# Patient Record
Sex: Female | Born: 1940
Health system: Southern US, Community
[De-identification: ages and names within clinical notes are randomized; demographics above are authoritative.]

## PROBLEM LIST (undated history)

## (undated) DIAGNOSIS — M47812 Spondylosis without myelopathy or radiculopathy, cervical region: Secondary | ICD-10-CM

## (undated) DIAGNOSIS — C801 Malignant (primary) neoplasm, unspecified: Secondary | ICD-10-CM

## (undated) DIAGNOSIS — M1990 Unspecified osteoarthritis, unspecified site: Secondary | ICD-10-CM

## (undated) DIAGNOSIS — L900 Lichen sclerosus et atrophicus: Secondary | ICD-10-CM

## (undated) DIAGNOSIS — Z9889 Other specified postprocedural states: Secondary | ICD-10-CM

## (undated) DIAGNOSIS — E039 Hypothyroidism, unspecified: Secondary | ICD-10-CM

## (undated) DIAGNOSIS — G454 Transient global amnesia: Secondary | ICD-10-CM

## (undated) DIAGNOSIS — R112 Nausea with vomiting, unspecified: Secondary | ICD-10-CM

## (undated) DIAGNOSIS — F419 Anxiety disorder, unspecified: Secondary | ICD-10-CM

## (undated) DIAGNOSIS — T4145XA Adverse effect of unspecified anesthetic, initial encounter: Secondary | ICD-10-CM

## (undated) DIAGNOSIS — I10 Essential (primary) hypertension: Secondary | ICD-10-CM

## (undated) DIAGNOSIS — K219 Gastro-esophageal reflux disease without esophagitis: Secondary | ICD-10-CM

## (undated) DIAGNOSIS — L659 Nonscarring hair loss, unspecified: Secondary | ICD-10-CM

## (undated) DIAGNOSIS — D1803 Hemangioma of intra-abdominal structures: Secondary | ICD-10-CM

## (undated) DIAGNOSIS — R519 Headache, unspecified: Secondary | ICD-10-CM

## (undated) DIAGNOSIS — I7300 Raynaud's syndrome without gangrene: Secondary | ICD-10-CM

## (undated) DIAGNOSIS — E069 Thyroiditis, unspecified: Secondary | ICD-10-CM

## (undated) DIAGNOSIS — T8859XA Other complications of anesthesia, initial encounter: Secondary | ICD-10-CM

## (undated) DIAGNOSIS — B009 Herpesviral infection, unspecified: Secondary | ICD-10-CM

## (undated) DIAGNOSIS — E785 Hyperlipidemia, unspecified: Secondary | ICD-10-CM

## (undated) DIAGNOSIS — I447 Left bundle-branch block, unspecified: Secondary | ICD-10-CM

## (undated) HISTORY — DX: Essential (primary) hypertension: I10

## (undated) HISTORY — PX: CATARACT EXTRACTION: SUR2

## (undated) HISTORY — PX: CHOLECYSTECTOMY: SHX55

## (undated) HISTORY — DX: Hemangioma of intra-abdominal structures: D18.03

## (undated) HISTORY — DX: Spondylosis without myelopathy or radiculopathy, cervical region: M47.812

## (undated) HISTORY — PX: ROTATOR CUFF REPAIR: SHX139

## (undated) HISTORY — DX: Thyroiditis, unspecified: E06.9

## (undated) HISTORY — DX: Transient global amnesia: G45.4

## (undated) HISTORY — PX: SHOULDER SURGERY: SHX246

## (undated) HISTORY — DX: Hyperlipidemia, unspecified: E78.5

## (undated) HISTORY — DX: Unspecified osteoarthritis, unspecified site: M19.90

## (undated) HISTORY — DX: Lichen sclerosus et atrophicus: L90.0

## (undated) HISTORY — DX: Herpesviral infection, unspecified: B00.9

## (undated) HISTORY — DX: Nonscarring hair loss, unspecified: L65.9

---
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
## 1989-01-26 HISTORY — PX: TOTAL ABDOMINAL HYSTERECTOMY W/ BILATERAL SALPINGOOPHORECTOMY: SHX83

## 1999-01-25 ENCOUNTER — Ambulatory Visit (HOSPITAL_COMMUNITY): Admission: RE | Admit: 1999-01-25 | Discharge: 1999-01-25 | Payer: Self-pay | Admitting: Obstetrics & Gynecology

## 1999-06-14 ENCOUNTER — Encounter: Admission: RE | Admit: 1999-06-14 | Discharge: 1999-06-14 | Payer: Self-pay | Admitting: *Deleted

## 1999-06-14 ENCOUNTER — Encounter: Payer: Self-pay | Admitting: *Deleted

## 1999-09-07 ENCOUNTER — Other Ambulatory Visit: Admission: RE | Admit: 1999-09-07 | Discharge: 1999-09-07 | Payer: Self-pay | Admitting: *Deleted

## 2000-02-14 ENCOUNTER — Encounter: Admission: RE | Admit: 2000-02-14 | Discharge: 2000-02-14 | Payer: Self-pay | Admitting: Orthopedic Surgery

## 2000-02-14 ENCOUNTER — Encounter: Payer: Self-pay | Admitting: Orthopedic Surgery

## 2000-10-29 ENCOUNTER — Encounter: Payer: Self-pay | Admitting: Orthopedic Surgery

## 2000-10-29 ENCOUNTER — Encounter: Admission: RE | Admit: 2000-10-29 | Discharge: 2000-10-29 | Payer: Self-pay | Admitting: Orthopedic Surgery

## 2000-11-26 DIAGNOSIS — M47812 Spondylosis without myelopathy or radiculopathy, cervical region: Secondary | ICD-10-CM

## 2000-11-26 HISTORY — DX: Spondylosis without myelopathy or radiculopathy, cervical region: M47.812

## 2002-02-11 ENCOUNTER — Ambulatory Visit (HOSPITAL_COMMUNITY): Admission: RE | Admit: 2002-02-11 | Discharge: 2002-02-11 | Payer: Self-pay | Admitting: Gastroenterology

## 2002-02-11 ENCOUNTER — Encounter (INDEPENDENT_AMBULATORY_CARE_PROVIDER_SITE_OTHER): Payer: Self-pay | Admitting: Specialist

## 2002-09-10 ENCOUNTER — Other Ambulatory Visit: Admission: RE | Admit: 2002-09-10 | Discharge: 2002-09-10 | Payer: Self-pay | Admitting: *Deleted

## 2002-11-23 ENCOUNTER — Encounter: Payer: Self-pay | Admitting: Orthopedic Surgery

## 2002-11-23 ENCOUNTER — Encounter: Admission: RE | Admit: 2002-11-23 | Discharge: 2002-11-23 | Payer: Self-pay | Admitting: Orthopedic Surgery

## 2003-09-28 ENCOUNTER — Encounter: Admission: RE | Admit: 2003-09-28 | Discharge: 2003-09-28 | Payer: Self-pay | Admitting: Orthopedic Surgery

## 2006-10-29 HISTORY — PX: CARDIOVASCULAR STRESS TEST: SHX262

## 2008-03-25 ENCOUNTER — Encounter: Admission: RE | Admit: 2008-03-25 | Discharge: 2008-03-25 | Payer: Self-pay | Admitting: Sports Medicine

## 2008-03-31 ENCOUNTER — Encounter: Admission: RE | Admit: 2008-03-31 | Discharge: 2008-03-31 | Payer: Self-pay | Admitting: Sports Medicine

## 2008-10-03 ENCOUNTER — Encounter: Admission: RE | Admit: 2008-10-03 | Discharge: 2008-10-03 | Payer: Self-pay | Admitting: Gastroenterology

## 2008-12-06 ENCOUNTER — Emergency Department (HOSPITAL_COMMUNITY): Admission: EM | Admit: 2008-12-06 | Discharge: 2008-12-06 | Payer: Self-pay | Admitting: Emergency Medicine

## 2009-07-29 DIAGNOSIS — D1803 Hemangioma of intra-abdominal structures: Secondary | ICD-10-CM

## 2009-07-29 HISTORY — DX: Hemangioma of intra-abdominal structures: D18.03

## 2009-10-11 HISTORY — PX: US ECHOCARDIOGRAPHY: HXRAD669

## 2009-10-13 ENCOUNTER — Ambulatory Visit (HOSPITAL_COMMUNITY): Admission: RE | Admit: 2009-10-13 | Discharge: 2009-10-13 | Payer: Self-pay | Admitting: Internal Medicine

## 2009-11-28 ENCOUNTER — Encounter: Admission: RE | Admit: 2009-11-28 | Discharge: 2009-11-28 | Payer: Self-pay | Admitting: Internal Medicine

## 2010-04-27 ENCOUNTER — Ambulatory Visit (HOSPITAL_COMMUNITY): Admission: RE | Admit: 2010-04-27 | Discharge: 2010-04-27 | Payer: Self-pay | Admitting: Internal Medicine

## 2010-07-11 ENCOUNTER — Encounter
Admission: RE | Admit: 2010-07-11 | Discharge: 2010-07-11 | Payer: Self-pay | Source: Home / Self Care | Attending: Internal Medicine | Admitting: Internal Medicine

## 2010-08-16 ENCOUNTER — Emergency Department (HOSPITAL_COMMUNITY)
Admission: EM | Admit: 2010-08-16 | Discharge: 2010-08-16 | Payer: Self-pay | Source: Home / Self Care | Admitting: Emergency Medicine

## 2010-08-19 ENCOUNTER — Encounter: Payer: Self-pay | Admitting: Rheumatology

## 2010-08-20 LAB — POCT I-STAT, CHEM 8
BUN: 18 mg/dL (ref 6–23)
Calcium, Ion: 1.25 mmol/L (ref 1.12–1.32)
Chloride: 97 mEq/L (ref 96–112)
Creatinine, Ser: 0.9 mg/dL (ref 0.4–1.2)
Glucose, Bld: 130 mg/dL — ABNORMAL HIGH (ref 70–99)
HCT: 42 % (ref 36.0–46.0)
Hemoglobin: 14.3 g/dL (ref 12.0–15.0)
Potassium: 3.9 mEq/L (ref 3.5–5.1)
Sodium: 133 mEq/L — ABNORMAL LOW (ref 135–145)
TCO2: 29 mmol/L (ref 0–100)

## 2010-10-02 ENCOUNTER — Other Ambulatory Visit: Payer: Self-pay | Admitting: Internal Medicine

## 2010-10-02 DIAGNOSIS — R911 Solitary pulmonary nodule: Secondary | ICD-10-CM

## 2010-10-11 ENCOUNTER — Ambulatory Visit
Admission: RE | Admit: 2010-10-11 | Discharge: 2010-10-11 | Disposition: A | Discharge status: Home / Self Care | Payer: Medicare Other | Source: Ambulatory Visit | Attending: Internal Medicine | Admitting: Internal Medicine

## 2010-10-11 DIAGNOSIS — R911 Solitary pulmonary nodule: Secondary | ICD-10-CM

## 2010-10-22 LAB — BASIC METABOLIC PANEL
BUN: 13 mg/dL (ref 6–23)
CO2: 27 mEq/L (ref 19–32)
Calcium: 9.7 mg/dL (ref 8.4–10.5)
Chloride: 99 mEq/L (ref 96–112)
Creatinine, Ser: 0.65 mg/dL (ref 0.4–1.2)
GFR calc Af Amer: >60 mL/min (ref >60)
GFR calc non Af Amer: >60 mL/min (ref >60)
Glucose, Bld: 102 mg/dL — ABNORMAL HIGH (ref 70–99)
Potassium: 4 mEq/L (ref 3.5–5.1)
Sodium: 133 mEq/L — ABNORMAL LOW (ref 135–145)

## 2010-11-06 LAB — POCT I-STAT, CHEM 8
BUN: 12 mg/dL (ref 6–23)
Calcium, Ion: 1.09 mmol/L — ABNORMAL LOW (ref 1.12–1.32)
Chloride: 104 mEq/L (ref 96–112)
Creatinine, Ser: 0.9 mg/dL (ref 0.4–1.2)
Glucose, Bld: 100 mg/dL — ABNORMAL HIGH (ref 70–99)
HCT: 43 % (ref 36.0–46.0)
Hemoglobin: 14.6 g/dL (ref 12.0–15.0)
Potassium: 3.2 mEq/L — ABNORMAL LOW (ref 3.5–5.1)
Sodium: 138 mEq/L (ref 135–145)
TCO2: 23 mmol/L (ref 0–100)

## 2011-05-15 ENCOUNTER — Encounter: Payer: Self-pay | Admitting: Cardiovascular Disease

## 2011-05-16 ENCOUNTER — Ambulatory Visit: Payer: Medicare Other | Admitting: Cardiovascular Disease

## 2011-05-20 ENCOUNTER — Ambulatory Visit (INDEPENDENT_AMBULATORY_CARE_PROVIDER_SITE_OTHER): Payer: Medicare Other | Admitting: Cardiovascular Disease

## 2011-05-20 ENCOUNTER — Encounter: Payer: Self-pay | Admitting: Cardiovascular Disease

## 2011-05-20 DIAGNOSIS — I10 Essential (primary) hypertension: Secondary | ICD-10-CM | POA: Insufficient documentation

## 2011-05-20 DIAGNOSIS — R079 Chest pain, unspecified: Secondary | ICD-10-CM | POA: Insufficient documentation

## 2011-05-20 NOTE — Patient Instructions (Addendum)
Your physician recommends that you schedule a follow-up appointment in: as needed basis  

## 2011-05-20 NOTE — Assessment & Plan Note (Signed)
She's not had any recurrent episodes of chest pain. She had a negative stress test in 2011. We will have her see [place] on an as-needed basis. She'll continue to followup with Dr. Clelia Croft for her blood pressure management. We'll be happy see her in the future if needed.

## 2011-05-20 NOTE — Progress Notes (Signed)
Sabrina Parks Date of Birth  03/01/1941 [facility] [address]    [address] [place], [place]  [address] [phone]  Fax  [phone]  History of Present Illness:  70 year old female with a history of hypertension, hyperlipidemia and rotator cuff repair. She's done quite well since I last saw her a year and half ago.  He's not had any cardiac complaints such as chest pain or shortness breath.  She seen Dr. Reyes Ivan  in the past. She had a negative stress test in 2008. She was sent back to [place] in 2011 for recurrent episodes of chest pain. A stress test at that time was negative for ischemia. She's done very well since that time.  Current Outpatient Prescriptions on File Prior to Visit  Medication Sig Dispense Refill  . amitriptyline (ELAVIL) 25 MG tablet Take 25 mg by mouth at bedtime.        Marland Kitchen aspirin 81 MG tablet Take 81 mg by mouth daily.        Marland Kitchen BIOTIN PO Take by mouth daily.        . fish oil-omega-3 fatty acids 1000 MG capsule Take 1 g by mouth daily.        . hydrochlorothiazide (HYDRODIURIL) 25 MG tablet Take 12.5 mg by mouth daily.       . Multiple Vitamin (MULTIVITAMIN) tablet Take 1 tablet by mouth daily.        . rosuvastatin (CRESTOR) 20 MG tablet Take 20 mg by mouth daily.          Allergies  Allergen Reactions  . Morphine And Related     Past Medical History  Diagnosis Date  . Chest pain   . Hyperlipidemia   . Hypertension   . Arthritis     Past Surgical History  Procedure Date  . Rotator cuff repair   . Appendectomy   . Total vaginal hysterectomy   . US echocardiography 10/11/2009    EF 55-60%  . Cardiovascular stress test 10/29/2006    EF 78%.     History  Smoking status  . Former Smoker  Smokeless tobacco  . Not on file    History  Alcohol Use No    Family History  Problem Relation Age of Onset  . Stroke Mother   . Cancer Father     Reviw of Systems:  Reviewed in the HPI.  All other systems are negative.  Physical Exam: BP  125/71  Pulse 87  Ht 5\' 4"  (1.626 m)  Wt 144 lb 12.8 oz (65.681 kg)  BMI 24.85 kg/m2 The patient is alert and oriented x 3.  The mood and affect are normal.   Skin: warm and dry.  Color is normal.    HEENT:   Pupils carotids. Her neck is supple  Lungs: Lungs are clear   Heart: Regular rate, S1-S2.    Abdomen: Benign  Extremities:  No clubbing cyanosis or  Neuro:  Nonfocal     ECG: Normal sinus rhythm. She has occasional premature ventricular contractions.  Assessment / Plan:

## 2011-05-29 ENCOUNTER — Other Ambulatory Visit: Payer: Self-pay | Admitting: *Deleted

## 2011-05-29 MED ORDER — SPIRONOLACTONE 25 MG PO TABS: 25.0000 mg | ORAL_TABLET | Freq: Every day | ORAL | Status: DC

## 2011-05-29 MED ORDER — OMEPRAZOLE 20 MG PO CPDR: 20.0000 mg | DELAYED_RELEASE_CAPSULE | Freq: Every day | ORAL | Status: DC

## 2011-05-29 MED ORDER — BIOTIN 1000 MCG PO TABS: 1000.0000 ug | ORAL_TABLET | Freq: Every day | ORAL | Status: AC

## 2011-05-29 NOTE — Progress Notes (Signed)
Pt mailed in written changes to [facility], all were corrected

## 2011-08-01 DIAGNOSIS — L821 Other seborrheic keratosis: Secondary | ICD-10-CM | POA: Diagnosis not present

## 2011-08-01 DIAGNOSIS — L659 Nonscarring hair loss, unspecified: Secondary | ICD-10-CM | POA: Diagnosis not present

## 2011-08-06 DIAGNOSIS — M25569 Pain in unspecified knee: Secondary | ICD-10-CM | POA: Diagnosis not present

## 2011-08-13 DIAGNOSIS — M25569 Pain in unspecified knee: Secondary | ICD-10-CM | POA: Diagnosis not present

## 2011-08-23 DIAGNOSIS — K219 Gastro-esophageal reflux disease without esophagitis: Secondary | ICD-10-CM | POA: Diagnosis not present

## 2011-10-07 ENCOUNTER — Other Ambulatory Visit: Payer: Self-pay | Admitting: Internal Medicine

## 2011-10-07 DIAGNOSIS — R911 Solitary pulmonary nodule: Secondary | ICD-10-CM

## 2011-10-09 ENCOUNTER — Other Ambulatory Visit: Payer: Medicare Other

## 2011-10-09 DIAGNOSIS — M1710 Unilateral primary osteoarthritis, unspecified knee: Secondary | ICD-10-CM | POA: Diagnosis not present

## 2011-10-10 DIAGNOSIS — M25569 Pain in unspecified knee: Secondary | ICD-10-CM | POA: Diagnosis not present

## 2011-10-14 ENCOUNTER — Ambulatory Visit
Admission: RE | Admit: 2011-10-14 | Discharge: 2011-10-14 | Disposition: A | Discharge status: Home / Self Care | Payer: Medicare Other | Source: Ambulatory Visit | Attending: Internal Medicine | Admitting: Internal Medicine

## 2011-10-14 DIAGNOSIS — R911 Solitary pulmonary nodule: Secondary | ICD-10-CM

## 2011-10-14 DIAGNOSIS — R918 Other nonspecific abnormal finding of lung field: Secondary | ICD-10-CM | POA: Diagnosis not present

## 2011-10-17 ENCOUNTER — Other Ambulatory Visit: Payer: Self-pay | Admitting: Dermatology

## 2011-10-17 DIAGNOSIS — L819 Disorder of pigmentation, unspecified: Secondary | ICD-10-CM | POA: Diagnosis not present

## 2011-10-17 DIAGNOSIS — L578 Other skin changes due to chronic exposure to nonionizing radiation: Secondary | ICD-10-CM | POA: Diagnosis not present

## 2011-10-17 DIAGNOSIS — D239 Other benign neoplasm of skin, unspecified: Secondary | ICD-10-CM | POA: Diagnosis not present

## 2011-10-17 DIAGNOSIS — L570 Actinic keratosis: Secondary | ICD-10-CM | POA: Diagnosis not present

## 2011-10-17 DIAGNOSIS — L821 Other seborrheic keratosis: Secondary | ICD-10-CM | POA: Diagnosis not present

## 2011-11-05 DIAGNOSIS — Z1231 Encounter for screening mammogram for malignant neoplasm of breast: Secondary | ICD-10-CM | POA: Diagnosis not present

## 2011-11-25 DIAGNOSIS — M9420 Chondromalacia, unspecified site: Secondary | ICD-10-CM | POA: Diagnosis not present

## 2011-11-25 DIAGNOSIS — Y929 Unspecified place or not applicable: Secondary | ICD-10-CM | POA: Diagnosis not present

## 2011-11-25 DIAGNOSIS — X58XXXA Exposure to other specified factors, initial encounter: Secondary | ICD-10-CM | POA: Diagnosis not present

## 2011-11-25 DIAGNOSIS — M2240 Chondromalacia patellae, unspecified knee: Secondary | ICD-10-CM | POA: Diagnosis not present

## 2011-11-25 DIAGNOSIS — IMO0002 Reserved for concepts with insufficient information to code with codable children: Secondary | ICD-10-CM | POA: Diagnosis not present

## 2011-12-02 DIAGNOSIS — M25569 Pain in unspecified knee: Secondary | ICD-10-CM | POA: Diagnosis not present

## 2012-04-01 DIAGNOSIS — E785 Hyperlipidemia, unspecified: Secondary | ICD-10-CM | POA: Diagnosis not present

## 2012-04-01 DIAGNOSIS — I8000 Phlebitis and thrombophlebitis of superficial vessels of unspecified lower extremity: Secondary | ICD-10-CM | POA: Diagnosis not present

## 2012-04-01 DIAGNOSIS — IMO0001 Reserved for inherently not codable concepts without codable children: Secondary | ICD-10-CM | POA: Diagnosis not present

## 2012-04-01 DIAGNOSIS — R7301 Impaired fasting glucose: Secondary | ICD-10-CM | POA: Diagnosis not present

## 2012-04-01 DIAGNOSIS — Z23 Encounter for immunization: Secondary | ICD-10-CM | POA: Diagnosis not present

## 2012-04-20 DIAGNOSIS — D239 Other benign neoplasm of skin, unspecified: Secondary | ICD-10-CM | POA: Diagnosis not present

## 2012-04-20 DIAGNOSIS — Z85828 Personal history of other malignant neoplasm of skin: Secondary | ICD-10-CM | POA: Diagnosis not present

## 2012-04-20 DIAGNOSIS — L570 Actinic keratosis: Secondary | ICD-10-CM | POA: Diagnosis not present

## 2012-04-20 DIAGNOSIS — L821 Other seborrheic keratosis: Secondary | ICD-10-CM | POA: Diagnosis not present

## 2012-04-20 DIAGNOSIS — L819 Disorder of pigmentation, unspecified: Secondary | ICD-10-CM | POA: Diagnosis not present

## 2012-05-11 DIAGNOSIS — M79609 Pain in unspecified limb: Secondary | ICD-10-CM | POA: Diagnosis not present

## 2012-06-04 ENCOUNTER — Other Ambulatory Visit: Payer: Self-pay | Admitting: Dermatology

## 2012-06-04 DIAGNOSIS — C4442 Squamous cell carcinoma of skin of scalp and neck: Secondary | ICD-10-CM | POA: Diagnosis not present

## 2012-06-09 DIAGNOSIS — I10 Essential (primary) hypertension: Secondary | ICD-10-CM | POA: Diagnosis not present

## 2012-06-09 DIAGNOSIS — E039 Hypothyroidism, unspecified: Secondary | ICD-10-CM | POA: Diagnosis not present

## 2012-06-09 DIAGNOSIS — IMO0001 Reserved for inherently not codable concepts without codable children: Secondary | ICD-10-CM | POA: Diagnosis not present

## 2012-06-09 DIAGNOSIS — E785 Hyperlipidemia, unspecified: Secondary | ICD-10-CM | POA: Diagnosis not present

## 2012-06-12 ENCOUNTER — Encounter (HOSPITAL_COMMUNITY): Payer: Self-pay

## 2012-06-12 ENCOUNTER — Emergency Department (HOSPITAL_COMMUNITY): Payer: Medicare Other

## 2012-06-12 ENCOUNTER — Observation Stay (HOSPITAL_COMMUNITY)
Admission: EM | Admit: 2012-06-12 | Discharge: 2012-06-13 | Disposition: A | Discharge status: Home / Self Care | Payer: Medicare Other | Attending: Internal Medicine | Admitting: Internal Medicine

## 2012-06-12 DIAGNOSIS — Z79899 Other long term (current) drug therapy: Secondary | ICD-10-CM | POA: Diagnosis not present

## 2012-06-12 DIAGNOSIS — Z7982 Long term (current) use of aspirin: Secondary | ICD-10-CM | POA: Insufficient documentation

## 2012-06-12 DIAGNOSIS — Z7902 Long term (current) use of antithrombotics/antiplatelets: Secondary | ICD-10-CM | POA: Diagnosis not present

## 2012-06-12 DIAGNOSIS — G9389 Other specified disorders of brain: Secondary | ICD-10-CM | POA: Diagnosis not present

## 2012-06-12 DIAGNOSIS — E785 Hyperlipidemia, unspecified: Secondary | ICD-10-CM | POA: Diagnosis not present

## 2012-06-12 DIAGNOSIS — G459 Transient cerebral ischemic attack, unspecified: Secondary | ICD-10-CM | POA: Diagnosis not present

## 2012-06-12 DIAGNOSIS — Z87891 Personal history of nicotine dependence: Secondary | ICD-10-CM | POA: Insufficient documentation

## 2012-06-12 DIAGNOSIS — M129 Arthropathy, unspecified: Secondary | ICD-10-CM | POA: Diagnosis not present

## 2012-06-12 DIAGNOSIS — L570 Actinic keratosis: Secondary | ICD-10-CM | POA: Diagnosis not present

## 2012-06-12 DIAGNOSIS — I10 Essential (primary) hypertension: Secondary | ICD-10-CM | POA: Insufficient documentation

## 2012-06-12 DIAGNOSIS — I6350 Cerebral infarction due to unspecified occlusion or stenosis of unspecified cerebral artery: Secondary | ICD-10-CM | POA: Diagnosis not present

## 2012-06-12 LAB — URINALYSIS, ROUTINE W REFLEX MICROSCOPIC
Bilirubin Urine: NEGATIVE
Glucose, UA: NEGATIVE mg/dL
Hgb urine dipstick: NEGATIVE
Ketones, ur: NEGATIVE mg/dL
Leukocytes, UA: NEGATIVE
Nitrite: NEGATIVE
Protein, ur: NEGATIVE mg/dL
Specific Gravity, Urine: 1.007 (ref 1.005–1.030)
Urobilinogen, UA: 0.2 mg/dL (ref 0.0–1.0)
pH: 7 (ref 5.0–8.0)

## 2012-06-12 LAB — CBC
HCT: 38.1 % (ref 36.0–46.0)
Hemoglobin: 13.4 g/dL (ref 12.0–15.0)
MCH: 29.5 pg (ref 26.0–34.0)
MCHC: 35.2 g/dL (ref 30.0–36.0)
MCV: 83.9 fL (ref 78.0–100.0)
Platelets: 332 10*3/uL (ref 150–400)
RBC: 4.54 MIL/uL (ref 3.87–5.11)
RDW: 12.9 % (ref 11.5–15.5)
WBC: 7.8 10*3/uL (ref 4.0–10.5)

## 2012-06-12 LAB — COMPREHENSIVE METABOLIC PANEL
ALT: 29 U/L (ref 0–35)
AST: 24 U/L (ref 0–37)
Albumin: 4.2 g/dL (ref 3.5–5.2)
Alkaline Phosphatase: 97 U/L (ref 39–117)
BUN: 17 mg/dL (ref 6–23)
CO2: 27 mEq/L (ref 19–32)
Calcium: 10 mg/dL (ref 8.4–10.5)
Chloride: 97 mEq/L (ref 96–112)
Creatinine, Ser: 0.77 mg/dL (ref 0.50–1.10)
GFR calc Af Amer: >90 mL/min (ref >90)
GFR calc non Af Amer: 83 mL/min — ABNORMAL LOW (ref >90)
Glucose, Bld: 97 mg/dL (ref 70–99)
Potassium: 3.7 mEq/L (ref 3.5–5.1)
Sodium: 135 mEq/L (ref 135–145)
Total Bilirubin: 0.3 mg/dL (ref 0.3–1.2)
Total Protein: 7.2 g/dL (ref 6.0–8.3)

## 2012-06-12 LAB — DIFFERENTIAL
Basophils Absolute: 0 10*3/uL (ref 0.0–0.1)
Basophils Relative: 0 % (ref 0–1)
Eosinophils Absolute: 0.2 10*3/uL (ref 0.0–0.7)
Eosinophils Relative: 3 % (ref 0–5)
Lymphocytes Relative: 29 % (ref 12–46)
Lymphs Abs: 2.3 10*3/uL (ref 0.7–4.0)
Monocytes Absolute: 0.7 10*3/uL (ref 0.1–1.0)
Monocytes Relative: 9 % (ref 3–12)
Neutro Abs: 4.7 10*3/uL (ref 1.7–7.7)
Neutrophils Relative %: 59 % (ref 43–77)

## 2012-06-12 LAB — APTT: aPTT: 30 seconds (ref 24–37)

## 2012-06-12 LAB — PROTIME-INR
INR: 0.92 (ref 0.00–1.49)
Prothrombin Time: 12.3 seconds (ref 11.6–15.2)

## 2012-06-12 LAB — GLUCOSE, CAPILLARY: Glucose-Capillary: 92 mg/dL (ref 70–99)

## 2012-06-12 LAB — POCT I-STAT TROPONIN I: Troponin i, poc: 0 ng/mL (ref 0.00–0.08)

## 2012-06-12 LAB — TROPONIN I: Troponin I: <0.3 ng/mL (ref <0.30)

## 2012-06-12 MED ORDER — ZOLPIDEM TARTRATE 5 MG PO TABS: 5.0000 mg | ORAL_TABLET | Freq: Every evening | ORAL | Status: DC | PRN

## 2012-06-12 MED ORDER — SODIUM CHLORIDE 0.9 % IJ SOLN
3.0000 mL | Freq: Two times a day (BID) | INTRAMUSCULAR | Status: DC
  Administered 2012-06-13: 3 mL via INTRAVENOUS

## 2012-06-12 MED ORDER — SPIRONOLACTONE 25 MG PO TABS
25.0000 mg | ORAL_TABLET | Freq: Every day | ORAL | Status: DC
  Administered 2012-06-13: 25 mg via ORAL
  Filled 2012-06-12: qty 1

## 2012-06-12 MED ORDER — ACETAMINOPHEN 325 MG PO TABS: 650.0000 mg | ORAL_TABLET | Freq: Four times a day (QID) | ORAL | Status: DC | PRN

## 2012-06-12 MED ORDER — ALUM & MAG HYDROXIDE-SIMETH 200-200-20 MG/5ML PO SUSP: 30.0000 mL | Freq: Four times a day (QID) | ORAL | Status: DC | PRN

## 2012-06-12 MED ORDER — SODIUM CHLORIDE 0.9 % IJ SOLN: 3.0000 mL | INTRAMUSCULAR | Status: DC | PRN

## 2012-06-12 MED ORDER — ONDANSETRON HCL 4 MG PO TABS
4.0000 mg | ORAL_TABLET | Freq: Four times a day (QID) | ORAL | Status: DC | PRN
  Filled 2012-06-12: qty 0.5

## 2012-06-12 MED ORDER — LORAZEPAM 2 MG/ML IJ SOLN
0.5000 mg | Freq: Once | INTRAMUSCULAR | Status: AC
  Administered 2012-06-12: 0.5 mg via INTRAVENOUS
  Filled 2012-06-12: qty 1

## 2012-06-12 MED ORDER — HYDROCHLOROTHIAZIDE 25 MG PO TABS
12.5000 mg | ORAL_TABLET | Freq: Every day | ORAL | Status: DC
  Administered 2012-06-13: 12.5 mg via ORAL
  Filled 2012-06-12: qty 0.5

## 2012-06-12 MED ORDER — SODIUM CHLORIDE 0.9 % IV SOLN
Freq: Once | INTRAVENOUS | Status: AC
  Administered 2012-06-12: 18:00:00 via INTRAVENOUS

## 2012-06-12 MED ORDER — ENOXAPARIN SODIUM 40 MG/0.4ML ~~LOC~~ SOLN
40.0000 mg | SUBCUTANEOUS | Status: DC
  Administered 2012-06-12: 40 mg via SUBCUTANEOUS
  Filled 2012-06-12 (×2): qty 0.4

## 2012-06-12 MED ORDER — SODIUM CHLORIDE 0.9 % IV SOLN: 250.0000 mL | INTRAVENOUS | Status: DC | PRN

## 2012-06-12 MED ORDER — ONDANSETRON HCL 4 MG/2ML IJ SOLN: 4.0000 mg | Freq: Four times a day (QID) | INTRAMUSCULAR | Status: DC | PRN

## 2012-06-12 MED ORDER — HYDROCODONE-ACETAMINOPHEN 5-325 MG PO TABS: 1.0000 | ORAL_TABLET | ORAL | Status: DC | PRN

## 2012-06-12 MED ORDER — CLOPIDOGREL BISULFATE 75 MG PO TABS
75.0000 mg | ORAL_TABLET | Freq: Every day | ORAL | Status: DC
  Administered 2012-06-12: 75 mg via ORAL
  Filled 2012-06-12 (×2): qty 1

## 2012-06-12 MED ORDER — IRBESARTAN 150 MG PO TABS
150.0000 mg | ORAL_TABLET | Freq: Every day | ORAL | Status: DC
  Administered 2012-06-12: 150 mg via ORAL
  Filled 2012-06-12 (×2): qty 1

## 2012-06-12 MED ORDER — POLYETHYLENE GLYCOL 3350 17 G PO PACK
17.0000 g | PACK | Freq: Every day | ORAL | Status: DC | PRN
  Filled 2012-06-12: qty 1

## 2012-06-12 MED ORDER — FINASTERIDE 5 MG PO TABS
2.5000 mg | ORAL_TABLET | Freq: Every day | ORAL | Status: DC
  Administered 2012-06-13: 2.5 mg via ORAL
  Filled 2012-06-12: qty 0.5

## 2012-06-12 MED ORDER — AMLODIPINE BESYLATE 5 MG PO TABS
5.0000 mg | ORAL_TABLET | Freq: Every day | ORAL | Status: DC
  Administered 2012-06-13: 5 mg via ORAL
  Filled 2012-06-12: qty 1

## 2012-06-12 MED ORDER — ACETAMINOPHEN 650 MG RE SUPP: 650.0000 mg | Freq: Four times a day (QID) | RECTAL | Status: DC | PRN

## 2012-06-12 MED ORDER — AMITRIPTYLINE HCL 25 MG PO TABS
25.0000 mg | ORAL_TABLET | Freq: Every day | ORAL | Status: DC
  Administered 2012-06-12: 25 mg via ORAL
  Filled 2012-06-12 (×2): qty 1

## 2012-06-12 MED ORDER — SODIUM CHLORIDE 0.9 % IJ SOLN
3.0000 mL | Freq: Two times a day (BID) | INTRAMUSCULAR | Status: DC
  Administered 2012-06-12: 3 mL via INTRAVENOUS

## 2012-06-12 NOTE — ED Notes (Signed)
IV attempted x's 1 without success, IV team was paged and is at bedside.

## 2012-06-12 NOTE — ED Notes (Signed)
Pt's CBG is 92.RN notified.

## 2012-06-12 NOTE — ED Provider Notes (Signed)
This chart was scribed for Glynn Octave, MD by Bennett Scrape, ED Scribe. This patient was seen in room A12C/A12C and the patient's care was started at 5:45 PM.   Sabrina Parks is a 71 y.o. female who presents to the Emergency Department complaining of 1.5 hours of bilateral double vision with associated confusion and garbled speech. Family also reports that the pt does not remember the ride to the ED or the first few minutes being taken back to her ED room. Pt denies having difficulty speaking. She and her family report that she is back to baseline currently. She denies having any prior episodes of similar symptoms. She denies dizziness, CP, and lightheadedness as associated symptoms. She denies having a h/o lung problems. She has a h/o HLD, HTN and arthritis. She is a former smoker but denies alcohol use.   NEUROLOGICAL: Cranial nerves III through XII are intact, 5/5 strength throughout, no ataxia on finger to nose, visual fields full to confrontation. EYES: No nystagmus noted  5:48 PM- Discussed treatment plan which includes admission for possible TIA with pt at bedside and pt agreed to plan.  I personally performed the services described in this documentation, which was scribed in my presence. The recorded information has been reviewed and is accurate.  Medical screening examination/treatment/procedure(s) were conducted as a shared visit with non-physician practitioner(s) and myself.  I personally evaluated the patient during the encounter    Glynn Octave, MD 06/12/12 2006

## 2012-06-12 NOTE — ED Provider Notes (Signed)
MSE was initiated and I personally evaluated the patient and placed orders (if any) at  2:34 PM on June 12, 2012.  The patient appears stable so that the remainder of the MSE may be completed by another provider.  Patient with some confusion and difficulty seeing. Exam is nonfocal except for mild to focally at a distance. Extraocular movements are intact and conjugate. She does not appear to be code stroke at this time.   Juliet Rude. Rubin Payor, MD 06/12/12 1436

## 2012-06-12 NOTE — ED Notes (Signed)
Paged IV Therapy

## 2012-06-12 NOTE — ED Notes (Addendum)
Pt sts she started having double vision.  Pt family sts pt has slurred, thick speech.   Family sts pt spilled drink and seems to be "drunk".  Pt has no memory of spilling drink. MD took at look at patient and does not want to call a code stroke at this time.

## 2012-06-12 NOTE — H&P (Signed)
Physician Admission History and Physical     PCP:   Kari Baars, MD   Chief Complaint:  Slurred speech   HPI: Sabrina Parks is an 71 y.o. female.  Pt presents after having witnessed slurred speech earlier today. She complains that she had diplopia and then recalls no further events until she arrived in the ED. Upon arrival, all symptoms had resolved and she is being admitted to w/u TIA. Never had focal motor deficits.   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 2 puffs every 6 hours as needed 2)  Amitriptyline Hcl 25 Mg Tabs (Amitriptyline hcl) .Marland Kitchen.. 1 po qhs 3)  Aspirin 81 Mg Chw Tab (Aspirin) .... Take one (1) tablet by mouth daily 4)  Micardis 80 Mg Tabs (Telmisartan) .... Take one tablet daily. 5)  Hydrochlorothiazide 25 Mg Tabs (Hydrochlorothiazide) .Marland Kitchen.. 1 po daily 6)  Lorazepam 0.5 Mg Tabs (Lorazepam) .... Take one tablet by mouth twice a day as needed. 7)  Multivitamins Tabs (Multiple vitamin) .Marland Kitchen.. 1 po daily 8)  Synthroid 50 Mcg Tabs (Levothyroxine sodium) .... Take one tablet by mouth daily 9)  Ra Omeprazole 20 Mg Tbec (Omeprazole) .Marland Kitchen.. 1 po twice a day 10)  Amlodipine Besylate 10 Mg Tabs (Amlodipine besylate) .... One po every day 11)  Flonase 50 Mcg/act Susp (Fluticasone propionate) .Marland Kitchen.. 1 spray in each nostril qhs 12)  Crestor 10  Mg Tabs (Rosuvastatin calcium) .... Take one tablet daily. 13)  Vitamin D3 1000 Unit Tabs (Cholecalciferol) .... One po every day 14)  Finasteride 5 Mg Tabs (Finasteride) .... 1/2 tab po every day 15)  Spironolactone 25 Mg Tabs (Spironolactone) .... One po every day   Physical Exam: Filed Vitals:   06/12/12 1428 06/12/12 1820  BP: 116/54 132/65  Pulse: 91 85  Temp: 97.5 F (36.4 C) 97.9 F (36.6 C)  TempSrc: Oral Oral  Resp: 16 18  SpO2: 98% 98%   General appearance: AAOx3, NAD  Head: Normocephalic, without obvious abnormality, atraumatic Eyes: conjunctivae/corneas clear. PERRL, EOM's intact.  Nose: Nares normal. Septum midline. Mucosa normal. No drainage or sinus tenderness. Throat: lips, mucosa, and tongue normal; teeth and gums normal Neck: no adenopathy, no carotid bruit, no JVD and thyroid not enlarged, symmetric, no tenderness/mass/nodules Resp: CTAB, no wheezes, rales, rhonchi  Cardio: RRR, no MRG  GI: soft, non-tender; bowel sounds normal; no masses,  no organomegaly Extremities: extremities normal, atraumatic, no cyanosis or edema Pulses: 2+ and symmetric Lymph nodes: Cervical adenopathy: no cervical lymphadenopathy Neurologic: Alert and oriented X 3, normal strength and tone. Normal symmetric reflexes. CN II-XII intact     Labs on Admission:   [facility] 06/12/12 1715  NA 135  K 3.7  CL 97  CO2 27  GLUCOSE 97  BUN 17  CREATININE 0.77  CALCIUM 10.0  MG --  PHOS --    Basename 06/12/12 1715  AST 24  ALT 29  ALKPHOS 97  BILITOT 0.3  PROT 7.2  ALBUMIN 4.2   No results found for this basename: LIPASE:2,AMYLASE:2 in the last 72 hours  Basename 06/12/12 1715  WBC 7.8  NEUTROABS 4.7  HGB 13.4  HCT 38.1  MCV 83.9  PLT 332    Basename 06/12/12 1719  CKTOTAL --  CKMB --  CKMBINDEX --  TROPONINI <0.30   Lab Results  Component Value Date   INR 0.92 06/12/2012   No results found for this basename: TSH,T4TOTAL,FREET3,T3FREE,THYROIDAB in the last  72 hours No results found for this basename: VITAMINB12:2,FOLATE:2,FERRITIN:2,TIBC:2,IRON:2,RETICCTPCT:2 in the last 72 hours  Radiological Exams on Admission: Ct Head Wo Contrast  06/12/2012  *RADIOLOGY REPORT*  Clinical Data: Code stroke.  Slurred speech.  Double vision.  CT HEAD WITHOUT CONTRAST  Technique:  Contiguous axial images were obtained from the base of the skull through the vertex without contrast.  Comparison: No priors.  Findings: No acute intracranial abnormalities.  Specifically, no definite areas suspicious for acute/subacute cerebral ischemia, no evidence of acute intracerebral hemorrhage, no focal mass, mass effect, hydrocephalus or abnormal intra or extra-axial fluid collections.  There are some patchy ill-defined areas of decreased attenuation in the deep and periventricular white matter of the cerebral hemispheres bilaterally, likely to reflect very mild chronic microvascular ischemic disease.  No acute displaced skull fractures are identified.  Visualized paranasal sinuses and mastoids are well pneumatized.  IMPRESSION: 1.  No acute intracranial abnormalities. 2.  Mild chronic microvascular ischemic changes in the cerebral white matter, as above.  These results were called by telephone on 06/12/2012 at 03:41 p.m. to Dr. Rubin Payor, who verbally acknowledged these results.   Original Report Authenticated By: Trudie Reed, M.D.    Mr Brain Wo Contrast  06/12/2012  *RADIOLOGY REPORT*  Clinical Data: Blurred vision.  Confusion.  High blood pressure and hyperlipidemia.  MRI HEAD WITHOUT CONTRAST  Technique:  Multiplanar, multiecho pulse sequences of the brain and surrounding structures were obtained according to standard protocol without intravenous contrast.  Comparison: 06/12/2012 CT.  Findings: No acute infarct.  No intracranial hemorrhage.  Mild small vessel disease type changes.  No intracranial mass lesion detected on this unenhanced exam.  No hydrocephalus.  Major intracranial  vascular structures are patent.  Partially empty sella.  Cervical medullary junction, pineal region and orbital structures unremarkable.  Left parotid 1.1 cm nonspecific lesion.  Mild transverse ligament hypertrophy and mild spinal stenosis C4-5.  IMPRESSION: No acute infarct.  Mild small vessel disease type changes.  Left parotid 1.1 cm nonspecific lesion.   Original Report Authenticated By: Lacy Duverney, M.D.    Orders placed during the hospital encounter of 06/12/12  . ED EKG  . ED EKG  . EKG 12-LEAD  . EKG 12-LEAD    Assessment/Plan Admit OBS.   TIA   - MRI and CT head unremarkable as stated above   - order TTE w/ bubble, TCD, carotid U/S   - monitor on tele   - given she had these symptoms while on ASA 81, we will consider increasing her antiplatelet therapy to plavix 75 and have her PCP follow up on whether to continue this medication or resume ASA 81 after she is out of the immediate window where she is at higher risk of suffering a recurrent stroke   - plavix 75 daily while in house    HTN - cont home meds  Hyperlipidemia - crestor is not on formulary and appears pt had myositis to other statins, so will hold statin while in house or allow  pt to use home medication Hypothyroidism - cont home meds. Will not order TSH given she is asymptomatic and TSH can be altered in setting of acute illness  Insomnia - cont home meds   PPx - lovenox subq. Home PPI  FEN - SLIV. Cardiac diet.   Dispo - admit to obs and d/c pending the imaging results . Pt has f/u appt w/ Dr Clelia Croft next week    [facility], Jaena Brocato 06/12/2012, 7:48 PM

## 2012-06-12 NOTE — ED Notes (Signed)
Pt began having difficulty speaking and slurred speech prior to arrival to ED, pts family sts symptoms resolved while in CT.  Pts family gave pt crackers and water prior to nurses assessment, NIH, or stroke swallow screen.  All neuro assessments normal at present.

## 2012-06-12 NOTE — ED Notes (Signed)
Patient transported to MRI 

## 2012-06-12 NOTE — ED Provider Notes (Signed)
History     CSN: [id number]  Arrival date & time 06/12/12  1423   First MD Initiated Contact with Patient 06/12/12 1635      Chief Complaint  Patient presents with  . Altered Mental Status  . Eye Problem    (Consider location/radiation/quality/duration/timing/severity/associated sxs/prior treatment) HPI Sabrina Parks is a 71 y.o. female who presents with complaint of confusion, slurred speech, memory loss, double vision onset today, which is now resolved. Per husband, pt was on the phone with him, when suddenly she started not making sense, was confused, speech was slurred. Pt states symptoms lasted for about hour and a half and resolved completely. Pt has no hx of strokes or TIAs. Pt states she does not remember the episode. Onset of symptoms at 2pm. No weakness or numbness of extremities. No headache. No fever.   Past Medical History  Diagnosis Date  . Chest pain   . Hyperlipidemia   . Hypertension   . Arthritis     Past Surgical History  Procedure Date  . Rotator cuff repair   . Appendectomy   . Total vaginal hysterectomy   . US echocardiography 10/11/2009    EF 55-60%  . Cardiovascular stress test 10/29/2006    EF 78%.     Family History  Problem Relation Age of Onset  . Stroke Mother   . Cancer Father     History  Substance Use Topics  . Smoking status: Former Games developer  . Smokeless tobacco: Not on file  . Alcohol Use: No    OB History    Grav Para Term Preterm Abortions TAB SAB Ect Mult Living                  Review of Systems  Constitutional: Negative for fever and chills.  HENT: Negative for facial swelling, neck pain and neck stiffness.   Eyes: Positive for visual disturbance.  Neurological: Positive for speech difficulty. Negative for facial asymmetry, numbness and headaches.  Psychiatric/Behavioral: Positive for confusion.  All other systems reviewed and are negative.    Allergies  Morphine and related  Home Medications   Current  Outpatient Rx  Name  Route  Sig  Dispense  Refill  . AMITRIPTYLINE HCL 25 MG PO TABS   Oral   Take 25 mg by mouth at bedtime.           . ASPIRIN 81 MG PO TABS   Oral   Take 81 mg by mouth daily.           . OMEGA-3 FATTY ACIDS 1000 MG PO CAPS   Oral   Take 1 g by mouth daily.           Marland Kitchen HYDROCHLOROTHIAZIDE 25 MG PO TABS   Oral   Take 12.5 mg by mouth daily.          Marland Kitchen ONE-DAILY MULTI VITAMINS PO TABS   Oral   Take 1 tablet by mouth daily.           . NON FORMULARY      Taking Vitamin D3 Daily          . OMEPRAZOLE 20 MG PO CPDR   Oral   Take 1 capsule (20 mg total) by mouth daily.         Marland Kitchen ROSUVASTATIN CALCIUM 20 MG PO TABS   Oral   Take 20 mg by mouth daily.           Marland Kitchen SPIRONOLACTONE 25 MG PO TABS  Oral   Take 1 tablet (25 mg total) by mouth daily.           BP 116/54  Pulse 91  Temp 97.5 F (36.4 C) (Oral)  Resp 16  SpO2 98%  Physical Exam  Nursing note and vitals reviewed. Constitutional: She is oriented to person, place, and time. She appears well-developed and well-nourished. No distress.  HENT:  Head: Normocephalic.  Eyes: Conjunctivae normal and EOM are normal. Pupils are equal, round, and reactive to light.  Neck: Normal range of motion. Neck supple.  Cardiovascular: Normal rate, regular rhythm and normal heart sounds.   Pulmonary/Chest: Effort normal and breath sounds normal. No respiratory distress. She has no wheezes. She has no rales.  Abdominal: Soft. Bowel sounds are normal. She exhibits no distension. There is no tenderness. There is no rebound.  Musculoskeletal: She exhibits no edema.  Neurological: She is alert and oriented to person, place, and time. She displays normal reflexes. No cranial nerve deficit. She exhibits normal muscle tone. Coordination normal.       5/5 and equal upper and lower extremity strength bilaterally. Equal grip strength bilaterally. Normal finger to nose and heel to shin. No pronator drift.  Patellar reflexes 2+. Visual fields intact   Skin: Skin is warm and dry.    ED Course  Procedures (including critical care time)  Pt with AMS, visual changes, slurred speech for about , now all resolved. Pt's ABCD2 score is 4. Labs pending. CT negative.    Date: 06/13/2012  Rate: 80  Rhythm: normal sinus rhythm  QRS Axis: normal  Intervals: normal  ST/T Wave abnormalities: ST depressions anteriorly and ST depressions laterally  Conduction Disutrbances:none  Narrative Interpretation:   Old EKG Reviewed: none available    Results for orders placed during the hospital encounter of 06/12/12  PROTIME-INR      Component Value Range   Prothrombin Time 12.3  11.6 - 15.2 seconds   INR 0.92  0.00 - 1.49  APTT      Component Value Range   aPTT 30  24 - 37 seconds  CBC      Component Value Range   WBC 7.8  4.0 - 10.5 K/uL   RBC 4.54  3.87 - 5.11 MIL/uL   Hemoglobin 13.4  12.0 - 15.0 g/dL   HCT 16.1  09.6 - 04.5 %   MCV 83.9  78.0 - 100.0 fL   MCH 29.5  26.0 - 34.0 pg   MCHC 35.2  30.0 - 36.0 g/dL   RDW 40.9  81.1 - 91.4 %   Platelets 332  150 - 400 K/uL  DIFFERENTIAL      Component Value Range   Neutrophils Relative 59  43 - 77 %   Neutro Abs 4.7  1.7 - 7.7 K/uL   Lymphocytes Relative 29  12 - 46 %   Lymphs Abs 2.3  0.7 - 4.0 K/uL   Monocytes Relative 9  3 - 12 %   Monocytes Absolute 0.7  0.1 - 1.0 K/uL   Eosinophils Relative 3  0 - 5 %   Eosinophils Absolute 0.2  0.0 - 0.7 K/uL   Basophils Relative 0  0 - 1 %   Basophils Absolute 0.0  0.0 - 0.1 K/uL  COMPREHENSIVE METABOLIC PANEL      Component Value Range   Sodium 135  135 - 145 mEq/L   Potassium 3.7  3.5 - 5.1 mEq/L   Chloride 97  96 - 112 mEq/L  CO2 27  19 - 32 mEq/L   Glucose, Bld 97  70 - 99 mg/dL   BUN 17  6 - 23 mg/dL   Creatinine, Ser 1.61  0.50 - 1.10 mg/dL   Calcium 09.6  8.4 - 04.5 mg/dL   Total Protein 7.2  6.0 - 8.3 g/dL   Albumin 4.2  3.5 - 5.2 g/dL   AST 24  0 - 37 U/L   ALT 29  0 - 35 U/L     Alkaline Phosphatase 97  39 - 117 U/L   Total Bilirubin 0.3  0.3 - 1.2 mg/dL   GFR calc non Af Amer 83 (*) >90 mL/min   GFR calc Af Amer >90  >90 mL/min  TROPONIN I      Component Value Range   Troponin I <0.30  <0.30 ng/mL  GLUCOSE, CAPILLARY      Component Value Range   Glucose-Capillary 92  70 - 99 mg/dL   Comment 1 Notify RN     Comment 2 Documented in Chart    POCT I-STAT TROPONIN I      Component Value Range   Troponin i, poc 0.00  0.00 - 0.08 ng/mL   Comment 3            Ct Head Wo Contrast  06/12/2012  *RADIOLOGY REPORT*  Clinical Data: Code stroke.  Slurred speech.  Double vision.  CT HEAD WITHOUT CONTRAST  Technique:  Contiguous axial images were obtained from the base of the skull through the vertex without contrast.  Comparison: No priors.  Findings: No acute intracranial abnormalities.  Specifically, no definite areas suspicious for acute/subacute cerebral ischemia, no evidence of acute intracerebral hemorrhage, no focal mass, mass effect, hydrocephalus or abnormal intra or extra-axial fluid collections.  There are some patchy ill-defined areas of decreased attenuation in the deep and periventricular white matter of the cerebral hemispheres bilaterally, likely to reflect very mild chronic microvascular ischemic disease.  No acute displaced skull fractures are identified.  Visualized paranasal sinuses and mastoids are well pneumatized.  IMPRESSION: 1.  No acute intracranial abnormalities. 2.  Mild chronic microvascular ischemic changes in the cerebral white matter, as above.  These results were called by telephone on 06/12/2012 at 03:41 p.m. to Dr. Rubin Payor, who verbally acknowledged these results.   Original Report Authenticated By: Trudie Reed, M.D.    Mr Brain Wo Contrast  06/12/2012  *RADIOLOGY REPORT*  Clinical Data: Blurred vision.  Confusion.  High blood pressure and hyperlipidemia.  MRI HEAD WITHOUT CONTRAST  Technique:  Multiplanar, multiecho pulse sequences of the  brain and surrounding structures were obtained according to standard protocol without intravenous contrast.  Comparison: 06/12/2012 CT.  Findings: No acute infarct.  No intracranial hemorrhage.  Mild small vessel disease type changes.  No intracranial mass lesion detected on this unenhanced exam.  No hydrocephalus.  Major intracranial vascular structures are patent.  Partially empty sella.  Cervical medullary junction, pineal region and orbital structures unremarkable.  Left parotid 1.1 cm nonspecific lesion.  Mild transverse ligament hypertrophy and mild spinal stenosis C4-5.  IMPRESSION: No acute infarct.  Mild small vessel disease type changes.  Left parotid 1.1 cm nonspecific lesion.   Original Report Authenticated By: Lacy Duverney, M.D.     7:59 PM Negative MRI. Symptoms concerning for TIA. Will need further work up. ABCD score of 4, which excludes her from CDU protocol. Will admit   Spoke with Dr. Dimas Aguas, on call for Dr. Clelia Croft, who will come see pt in  ED>    1. TIA (transient ischemic attack)       MDM          Lottie Mussel, PA 06/13/12 [id number]

## 2012-06-13 DIAGNOSIS — I6350 Cerebral infarction due to unspecified occlusion or stenosis of unspecified cerebral artery: Secondary | ICD-10-CM | POA: Diagnosis not present

## 2012-06-13 DIAGNOSIS — E785 Hyperlipidemia, unspecified: Secondary | ICD-10-CM | POA: Diagnosis not present

## 2012-06-13 DIAGNOSIS — G459 Transient cerebral ischemic attack, unspecified: Secondary | ICD-10-CM

## 2012-06-13 DIAGNOSIS — I10 Essential (primary) hypertension: Secondary | ICD-10-CM | POA: Diagnosis not present

## 2012-06-13 LAB — CBC
HCT: 39.1 % (ref 36.0–46.0)
Hemoglobin: 13.3 g/dL (ref 12.0–15.0)
MCH: 28.8 pg (ref 26.0–34.0)
MCHC: 34 g/dL (ref 30.0–36.0)
MCV: 84.6 fL (ref 78.0–100.0)
Platelets: 338 10*3/uL (ref 150–400)
RBC: 4.62 MIL/uL (ref 3.87–5.11)
RDW: 12.9 % (ref 11.5–15.5)
WBC: 6.3 10*3/uL (ref 4.0–10.5)

## 2012-06-13 LAB — COMPREHENSIVE METABOLIC PANEL
ALT: 29 U/L (ref 0–35)
AST: 24 U/L (ref 0–37)
Albumin: 4.1 g/dL (ref 3.5–5.2)
Alkaline Phosphatase: 97 U/L (ref 39–117)
BUN: 13 mg/dL (ref 6–23)
CO2: 29 mEq/L (ref 19–32)
Calcium: 10.1 mg/dL (ref 8.4–10.5)
Chloride: 98 mEq/L (ref 96–112)
Creatinine, Ser: 0.65 mg/dL (ref 0.50–1.10)
GFR calc Af Amer: >90 mL/min (ref >90)
GFR calc non Af Amer: 88 mL/min — ABNORMAL LOW (ref >90)
Glucose, Bld: 101 mg/dL — ABNORMAL HIGH (ref 70–99)
Potassium: 3.7 mEq/L (ref 3.5–5.1)
Sodium: 135 mEq/L (ref 135–145)
Total Bilirubin: 0.6 mg/dL (ref 0.3–1.2)
Total Protein: 7.1 g/dL (ref 6.0–8.3)

## 2012-06-13 MED ORDER — PANTOPRAZOLE SODIUM 40 MG PO TBEC: 40.0000 mg | DELAYED_RELEASE_TABLET | Freq: Every day | ORAL | Status: DC

## 2012-06-13 MED ORDER — CLOPIDOGREL BISULFATE 75 MG PO TABS: ORAL_TABLET | ORAL | Status: DC

## 2012-06-13 NOTE — Progress Notes (Signed)
Physician Daily Progress Note  Subjective: No events overnight  Objective: Vital signs in last 24 hours: Temp:  [97.5 F (36.4 C)-97.9 F (36.6 C)] 97.8 F (36.6 C) (11/16 0200) Pulse Rate:  [69-91] 69  (11/16 0200) Resp:  [16-18] 18  (11/16 0200) BP: (116-136)/(54-65) 136/59 mmHg (11/16 0200) SpO2:  [97 %-100 %] 97 % (11/16 0200) Weight:  [145 lb 4.8 oz (65.908 kg)] 145 lb 4.8 oz (65.908 kg) (11/15 2202) Weight change:     CBG (last 3)   Basename 06/12/12 1659  GLUCAP 92    Intake/Output from previous day:   Intake/Output this shift:    Physical Exam General appearance: AAOx3, NAD  Head: Normocephalic, without obvious abnormality, atraumatic  Eyes: conjunctivae/corneas clear. PERRL, EOM's intact.  Nose: Nares normal. Septum midline. Mucosa normal. No drainage or sinus tenderness.  Throat: lips, mucosa, and tongue normal; teeth and gums normal  Neck: no adenopathy, no carotid bruit, no JVD and thyroid not enlarged, symmetric, no tenderness/mass/nodules  Resp: CTAB, no wheezes, rales, rhonchi  Cardio: RRR, no MRG  GI: soft, non-tender; bowel sounds normal; no masses, no organomegaly  Extremities: extremities normal, atraumatic, no cyanosis or edema  Pulses: 2+ and symmetric  Lymph nodes: Cervical adenopathy: no cervical lymphadenopathy  Neurologic: Alert and oriented X 3, normal strength and tone. Normal symmetric reflexes. CN II-XII intact      Lab Results:  [facility] 06/12/12 1715  NA 135  K 3.7  CL 97  CO2 27  GLUCOSE 97  BUN 17  CREATININE 0.77  CALCIUM 10.0  MG --  PHOS --    Basename 06/12/12 1715  AST 24  ALT 29  ALKPHOS 97  BILITOT 0.3  PROT 7.2  ALBUMIN 4.2    Basename 06/12/12 1715  WBC 7.8  NEUTROABS 4.7  HGB 13.4  HCT 38.1  MCV 83.9  PLT 332   Lab Results  Component Value Date   INR 0.92 06/12/2012    Basename 06/12/12 1719  CKTOTAL --  CKMB --  CKMBINDEX --  TROPONINI <0.30     Studies/Results: Ct Head Wo  Contrast  06/12/2012  *RADIOLOGY REPORT*  Clinical Data: Code stroke.  Slurred speech.  Double vision.  CT HEAD WITHOUT CONTRAST  Technique:  Contiguous axial images were obtained from the base of the skull through the vertex without contrast.  Comparison: No priors.  Findings: No acute intracranial abnormalities.  Specifically, no definite areas suspicious for acute/subacute cerebral ischemia, no evidence of acute intracerebral hemorrhage, no focal mass, mass effect, hydrocephalus or abnormal intra or extra-axial fluid collections.  There are some patchy ill-defined areas of decreased attenuation in the deep and periventricular white matter of the cerebral hemispheres bilaterally, likely to reflect very mild chronic microvascular ischemic disease.  No acute displaced skull fractures are identified.  Visualized paranasal sinuses and mastoids are well pneumatized.  IMPRESSION: 1.  No acute intracranial abnormalities. 2.  Mild chronic microvascular ischemic changes in the cerebral white matter, as above.  These results were called by telephone on 06/12/2012 at 03:41 p.m. to Dr. Rubin Payor, who verbally acknowledged these results.   Original Report Authenticated By: Trudie Reed, M.D.    Mr Brain Wo Contrast  06/12/2012  *RADIOLOGY REPORT*  Clinical Data: Blurred vision.  Confusion.  High blood pressure and hyperlipidemia.  MRI HEAD WITHOUT CONTRAST  Technique:  Multiplanar, multiecho pulse sequences of the brain and surrounding structures were obtained according to standard protocol without intravenous contrast.  Comparison: 06/12/2012 CT.  Findings: No acute  infarct.  No intracranial hemorrhage.  Mild small vessel disease type changes.  No intracranial mass lesion detected on this unenhanced exam.  No hydrocephalus.  Major intracranial vascular structures are patent.  Partially empty sella.  Cervical medullary junction, pineal region and orbital structures unremarkable.  Left parotid 1.1 cm nonspecific  lesion.  Mild transverse ligament hypertrophy and mild spinal stenosis C4-5.  IMPRESSION: No acute infarct.  Mild small vessel disease type changes.  Left parotid 1.1 cm nonspecific lesion.   Original Report Authenticated By: Lacy Duverney, M.D.      Medications: Scheduled:   . [COMPLETED] sodium chloride   Intravenous Once  . amitriptyline  25 mg Oral QHS  . amLODipine  5 mg Oral Daily  . clopidogrel  75 mg Oral Q1500  . enoxaparin (LOVENOX) injection  40 mg Subcutaneous Q24H  . finasteride  2.5 mg Oral Daily  . hydrochlorothiazide  12.5 mg Oral Daily  . irbesartan  150 mg Oral QHS  . [COMPLETED] LORazepam  0.5 mg Intravenous Once  . sodium chloride  3 mL Intravenous Q12H  . sodium chloride  3 mL Intravenous Q12H  . spironolactone  25 mg Oral Daily   Continuous:   Assessment/Plan: Admit OBS.   TIA  - MRI and CT head unremarkable as stated above  - ordered TTE w/ bubble, TCD, carotid U/S for today - monitor on tele  - given she had these symptoms while on ASA 81, currently increased her antiplatelet to plavix 75 daily   - have her PCP follow up on whether to continue plavix or resume ASA 81 after she is out of the immediate window where she is at higher risk of suffering a recurrent stroke   HTN - cont home meds   Hyperlipidemia - crestor is not on formulary and appears pt had myositis to other statins, so will hold statin while in house or allow pt to use home medication   Hypothyroidism - cont home meds. Will not order TSH given she is asymptomatic and TSH can be altered in setting of acute illness   Insomnia - cont home meds  PPx - lovenox subq. Home PPI  FEN - SLIV. Cardiac diet.  Dispo -d/c pending the imaging results . Pt has f/u appt w/ Dr Clelia Croft next week per her report   LOS: 1 day   Sabrina Parks 06/13/2012, 5:48 AM

## 2012-06-13 NOTE — ED Provider Notes (Signed)
Medical screening examination/treatment/procedure(s) were conducted as a shared visit with non-physician practitioner(s) and myself.  I personally evaluated the patient during the encounter  1 hour episode of slurred speech, double vision, confusion. Now back to baseline. No motor deficits. ABCD2 = 4  Glynn Octave, MD 06/13/12 1159

## 2012-06-13 NOTE — Discharge Summary (Signed)
Physician Discharge Summary    Idabell Picking  MR#: [id number]  DOB:Oct 04, 1940  Date of Admission: 06/12/2012 Date of Discharge: 06/13/2012  Attending Physician:Nina Mondor  Patient's YQM:VHQI,O DOUGLAS, MD  Consults: none   Discharge Diagnoses: Principal Problem:  *TIA (transient ischemic attack)   Discharge Medications:   Medication List     As of 06/13/2012  3:23 PM    STOP taking these medications         aspirin 81 MG tablet      TAKE these medications         amitriptyline 25 MG tablet   Commonly known as: ELAVIL   Take 25 mg by mouth at bedtime.      amLODipine 10 MG tablet   Commonly known as: NORVASC   Take 5 mg by mouth daily.      clopidogrel 75 MG tablet   Commonly known as: PLAVIX   Take 1 tab by mouth daily      finasteride 5 MG tablet   Commonly known as: PROSCAR   Take 2.5 mg by mouth daily.      fish oil-omega-3 fatty acids 1000 MG capsule   Take 1 g by mouth daily.      hydrochlorothiazide 25 MG tablet   Commonly known as: HYDRODIURIL   Take 12.5 mg by mouth daily.      ibuprofen 200 MG tablet   Commonly known as: ADVIL,MOTRIN   Take 200 mg by mouth every 6 (six) hours as needed. For pain      multivitamin tablet   Take 1 tablet by mouth daily.      rosuvastatin 20 MG tablet   Commonly known as: CRESTOR   Take 20 mg by mouth daily.      spironolactone 25 MG tablet   Commonly known as: ALDACTONE   Take 1 tablet (25 mg total) by mouth daily.      telmisartan 80 MG tablet   Commonly known as: MICARDIS   Take 40 mg by mouth at bedtime.      VITAMIN D-3 PO   Take 1 tablet by mouth daily.        Hospital Procedures: Ct Head Wo Contrast  06/12/2012  *RADIOLOGY REPORT*  Clinical Data: Code stroke.  Slurred speech.  Double vision.  CT HEAD WITHOUT CONTRAST  Technique:  Contiguous axial images were obtained from the base of the skull through the vertex without contrast.  Comparison: No priors.  Findings: No acute  intracranial abnormalities.  Specifically, no definite areas suspicious for acute/subacute cerebral ischemia, no evidence of acute intracerebral hemorrhage, no focal mass, mass effect, hydrocephalus or abnormal intra or extra-axial fluid collections.  There are some patchy ill-defined areas of decreased attenuation in the deep and periventricular white matter of the cerebral hemispheres bilaterally, likely to reflect very mild chronic microvascular ischemic disease.  No acute displaced skull fractures are identified.  Visualized paranasal sinuses and mastoids are well pneumatized.  IMPRESSION: 1.  No acute intracranial abnormalities. 2.  Mild chronic microvascular ischemic changes in the cerebral white matter, as above.  These results were called by telephone on 06/12/2012 at 03:41 p.m. to Dr. Rubin Payor, who verbally acknowledged these results.   Original Report Authenticated By: Trudie Reed, M.D.    Mr Brain Wo Contrast  06/12/2012  *RADIOLOGY REPORT*  Clinical Data: Blurred vision.  Confusion.  High blood pressure and hyperlipidemia.  MRI HEAD WITHOUT CONTRAST  Technique:  Multiplanar, multiecho pulse sequences of the brain and surrounding structures were obtained  according to standard protocol without intravenous contrast.  Comparison: 06/12/2012 CT.  Findings: No acute infarct.  No intracranial hemorrhage.  Mild small vessel disease type changes.  No intracranial mass lesion detected on this unenhanced exam.  No hydrocephalus.  Major intracranial vascular structures are patent.  Partially empty sella.  Cervical medullary junction, pineal region and orbital structures unremarkable.  Left parotid 1.1 cm nonspecific lesion.  Mild transverse ligament hypertrophy and mild spinal stenosis C4-5.  IMPRESSION: No acute infarct.  Mild small vessel disease type changes.  Left parotid 1.1 cm nonspecific lesion.   Original Report Authenticated By: Lacy Duverney, M.D.     History of Present Illness: Pt w/ PMH of  HTN ,HLD and no prior h/o CVA or TIA presents to the ED via car after calling EMS for slurred speech and AMS.  Hospital Course: Upon ED arrival, her symptoms were resolved and a CT head was performed that was unremarkable and code stroke was called off. MRI head was also unremarkable for CVA, but given her ABCD score (per ED) was 5, she was not candidate for the TIA obs unit and needed IM obs admission. TTE w/ bubble, carotid and transcranial dopplers were ordered but only the transcranial dopplers were performed over the following 24 hours. The carotids were free of atherosclerotic changes and no stenosis. Given her clinical stability, low risk for CVA, and the fact that it will be 36 hours before she will be able to have further testing over the weekend, it has been decided to send the patient home w/ close follow up and outpatient bubble study. Since the first 48 hours post TIA are the highest risk for recurrent symptoms and her prior symptoms happened while taking ASA 81mg , she will be discharged on plavix 75mg  for antiplatelet therapy. At her PCP visit on Friday, they can decide whether  to resume the 81mg  ASA given she will be out of the higher risk time frame.   At time of discharge she was without any focal neurologic deficits x 24 hours and her speech was WNL, as was her neuro exam in its entirety.    Day of Discharge Exam BP 116/57  Pulse 77  Temp 98.2 F (36.8 C) (Oral)  Resp 20  Ht 5\' 4"  (1.626 m)  Wt 145 lb 4.8 oz (65.908 kg)  BMI 24.94 kg/m2  SpO2 100%  LMP 07/29/1990  Physical Exam: General appearance : NAD, mucous membranes moist, AAOx3  Eyes: no scleral icterus. PERRL, EOMI  Throat: oropharynx moist without erythema Resp: CTAB, no wheezes or rhonchi Cardio: RRR, no MRG  GI: soft, non-tender; bowel sounds normal; no masses,  no organomegaly Extremities: no clubbing, cyanosis or edema Neuro: 5/5 strength in upper and lower extremities. Normal reflexes diffusely. CN II-XII  grossly intact   Discharge Labs:  [facility] 06/13/12 0715 06/12/12 1715  NA 135 135  K 3.7 3.7  CL 98 97  CO2 29 27  GLUCOSE 101* 97  BUN 13 17  CREATININE 0.65 0.77  CALCIUM 10.1 10.0  MG -- --  PHOS -- --    Basename 06/13/12 0715 06/12/12 1715  AST 24 24  ALT 29 29  ALKPHOS 97 97  BILITOT 0.6 0.3  PROT 7.1 7.2  ALBUMIN 4.1 4.2    Basename 06/13/12 0715 06/12/12 1715  WBC 6.3 7.8  NEUTROABS -- 4.7  HGB 13.3 13.4  HCT 39.1 38.1  MCV 84.6 83.9  PLT 338 332   Lab Results  Component Value Date  INR 0.92 06/12/2012    Basename 06/12/12 1719  CKTOTAL --  CKMB --  CKMBINDEX --  TROPONINI <0.30   No results found for this basename: TSH,T4TOTAL,FREET3,T3FREE,THYROIDAB in the last 72 hours No results found for this basename: VITAMINB12:2,FOLATE:2,FERRITIN:2,TIBC:2,IRON:2,RETICCTPCT:2 in the last 72 hours  Discharge instructions:     Discharge Orders    Future Orders Please Complete By Expires   Diet - low sodium heart healthy      Increase activity slowly      Discharge instructions      Comments:   Return to the ED immediately if you experience in further stroke like symptoms   Call MD for:  persistant nausea and vomiting      Call MD for:  redness, tenderness, or signs of infection (pain, swelling, redness, odor or green/yellow discharge around incision site)      Call MD for:  difficulty breathing, headache or visual disturbances        Final discharge disposition not confirmed   Disposition: home   Follow-up Appts: Follow-up with Dr. Clelia Croft at [facility] on Friday.   Condition on Discharge: stable   Tests Needing Follow-up: needs to have TTE ordered as outpatient   Time spent in discharge (includes decision making & examination of pt): < 30 minutes    Signed: Yaritzel Stange 06/13/2012, 3:23 PM

## 2012-06-13 NOTE — Progress Notes (Signed)
*  PRELIMINARY RESULTS* Vascular Ultrasound Carotid Duplex (Doppler) has been completed.  Preliminary findings: Bilateral:  No evidence of hemodynamically significant internal carotid artery stenosis.   Vertebral artery flow is antegrade.     Farrel Demark, RDMS, RVT  06/13/2012, 11:07 AM

## 2012-06-15 ENCOUNTER — Other Ambulatory Visit: Payer: Self-pay | Admitting: Dermatology

## 2012-06-15 DIAGNOSIS — H2510 Age-related nuclear cataract, unspecified eye: Secondary | ICD-10-CM | POA: Diagnosis not present

## 2012-06-15 DIAGNOSIS — H43819 Vitreous degeneration, unspecified eye: Secondary | ICD-10-CM | POA: Diagnosis not present

## 2012-06-15 DIAGNOSIS — H40019 Open angle with borderline findings, low risk, unspecified eye: Secondary | ICD-10-CM | POA: Diagnosis not present

## 2012-06-15 DIAGNOSIS — H5200 Hypermetropia, unspecified eye: Secondary | ICD-10-CM | POA: Diagnosis not present

## 2012-06-15 DIAGNOSIS — C4442 Squamous cell carcinoma of skin of scalp and neck: Secondary | ICD-10-CM | POA: Diagnosis not present

## 2012-06-19 DIAGNOSIS — I10 Essential (primary) hypertension: Secondary | ICD-10-CM | POA: Diagnosis not present

## 2012-06-19 DIAGNOSIS — G459 Transient cerebral ischemic attack, unspecified: Secondary | ICD-10-CM | POA: Diagnosis not present

## 2012-06-19 DIAGNOSIS — Z0000 Encounter for general adult medical examination without abnormal findings: Secondary | ICD-10-CM | POA: Diagnosis not present

## 2012-06-19 DIAGNOSIS — E039 Hypothyroidism, unspecified: Secondary | ICD-10-CM | POA: Diagnosis not present

## 2012-06-23 ENCOUNTER — Other Ambulatory Visit (HOSPITAL_COMMUNITY): Payer: Self-pay | Admitting: Internal Medicine

## 2012-06-23 ENCOUNTER — Ambulatory Visit (HOSPITAL_COMMUNITY): Payer: Medicare Other | Attending: Cardiology

## 2012-06-23 DIAGNOSIS — I517 Cardiomegaly: Secondary | ICD-10-CM | POA: Insufficient documentation

## 2012-06-23 DIAGNOSIS — G459 Transient cerebral ischemic attack, unspecified: Secondary | ICD-10-CM | POA: Diagnosis not present

## 2012-06-23 DIAGNOSIS — C4492 Squamous cell carcinoma of skin, unspecified: Secondary | ICD-10-CM | POA: Diagnosis not present

## 2012-06-23 DIAGNOSIS — Z8673 Personal history of transient ischemic attack (TIA), and cerebral infarction without residual deficits: Secondary | ICD-10-CM | POA: Insufficient documentation

## 2012-06-23 DIAGNOSIS — E785 Hyperlipidemia, unspecified: Secondary | ICD-10-CM | POA: Diagnosis not present

## 2012-06-23 DIAGNOSIS — I10 Essential (primary) hypertension: Secondary | ICD-10-CM | POA: Diagnosis not present

## 2012-06-23 NOTE — Progress Notes (Signed)
Echocardiogram performed.  

## 2012-06-24 ENCOUNTER — Encounter (HOSPITAL_COMMUNITY): Payer: Self-pay | Admitting: Internal Medicine

## 2012-07-07 DIAGNOSIS — L570 Actinic keratosis: Secondary | ICD-10-CM | POA: Diagnosis not present

## 2012-07-30 DIAGNOSIS — F29 Unspecified psychosis not due to a substance or known physiological condition: Secondary | ICD-10-CM | POA: Diagnosis not present

## 2012-07-31 ENCOUNTER — Other Ambulatory Visit: Payer: Self-pay | Admitting: Neurology

## 2012-07-31 DIAGNOSIS — R110 Nausea: Secondary | ICD-10-CM | POA: Diagnosis not present

## 2012-07-31 DIAGNOSIS — K5900 Constipation, unspecified: Secondary | ICD-10-CM | POA: Diagnosis not present

## 2012-07-31 DIAGNOSIS — R410 Disorientation, unspecified: Secondary | ICD-10-CM

## 2012-08-01 ENCOUNTER — Other Ambulatory Visit: Payer: Self-pay | Admitting: Neurology

## 2012-08-01 DIAGNOSIS — F29 Unspecified psychosis not due to a substance or known physiological condition: Secondary | ICD-10-CM | POA: Diagnosis not present

## 2012-08-01 LAB — CREATININE, SERUM: Creat: 0.8 mg/dL (ref 0.50–1.10)

## 2012-08-03 DIAGNOSIS — F29 Unspecified psychosis not due to a substance or known physiological condition: Secondary | ICD-10-CM | POA: Diagnosis not present

## 2012-08-04 ENCOUNTER — Ambulatory Visit
Admission: RE | Admit: 2012-08-04 | Discharge: 2012-08-04 | Disposition: A | Discharge status: Home / Self Care | Payer: Medicare Other | Source: Ambulatory Visit | Attending: Neurology | Admitting: Neurology

## 2012-08-04 DIAGNOSIS — R410 Disorientation, unspecified: Secondary | ICD-10-CM

## 2012-08-04 DIAGNOSIS — F29 Unspecified psychosis not due to a substance or known physiological condition: Secondary | ICD-10-CM | POA: Diagnosis not present

## 2012-08-04 MED ORDER — GADOBENATE DIMEGLUMINE 529 MG/ML IV SOLN
13.0000 mL | Freq: Once | INTRAVENOUS | Status: AC | PRN
  Administered 2012-08-04: 13 mL via INTRAVENOUS

## 2012-08-05 ENCOUNTER — Other Ambulatory Visit: Payer: Medicare Other

## 2012-08-06 DIAGNOSIS — M76899 Other specified enthesopathies of unspecified lower limb, excluding foot: Secondary | ICD-10-CM | POA: Diagnosis not present

## 2012-08-24 DIAGNOSIS — Z1212 Encounter for screening for malignant neoplasm of rectum: Secondary | ICD-10-CM | POA: Diagnosis not present

## 2012-09-30 DIAGNOSIS — M76899 Other specified enthesopathies of unspecified lower limb, excluding foot: Secondary | ICD-10-CM | POA: Diagnosis not present

## 2012-10-22 DIAGNOSIS — D1801 Hemangioma of skin and subcutaneous tissue: Secondary | ICD-10-CM | POA: Diagnosis not present

## 2012-10-22 DIAGNOSIS — L821 Other seborrheic keratosis: Secondary | ICD-10-CM | POA: Diagnosis not present

## 2012-10-22 DIAGNOSIS — L819 Disorder of pigmentation, unspecified: Secondary | ICD-10-CM | POA: Diagnosis not present

## 2012-10-22 DIAGNOSIS — Z85828 Personal history of other malignant neoplasm of skin: Secondary | ICD-10-CM | POA: Diagnosis not present

## 2012-10-22 DIAGNOSIS — L659 Nonscarring hair loss, unspecified: Secondary | ICD-10-CM | POA: Diagnosis not present

## 2012-10-22 DIAGNOSIS — L570 Actinic keratosis: Secondary | ICD-10-CM | POA: Diagnosis not present

## 2012-10-22 DIAGNOSIS — L820 Inflamed seborrheic keratosis: Secondary | ICD-10-CM | POA: Diagnosis not present

## 2012-10-29 DIAGNOSIS — I10 Essential (primary) hypertension: Secondary | ICD-10-CM | POA: Diagnosis not present

## 2012-10-29 DIAGNOSIS — R413 Other amnesia: Secondary | ICD-10-CM | POA: Diagnosis not present

## 2012-10-29 DIAGNOSIS — R7301 Impaired fasting glucose: Secondary | ICD-10-CM | POA: Diagnosis not present

## 2012-10-29 DIAGNOSIS — H532 Diplopia: Secondary | ICD-10-CM | POA: Diagnosis not present

## 2012-11-02 DIAGNOSIS — M76899 Other specified enthesopathies of unspecified lower limb, excluding foot: Secondary | ICD-10-CM | POA: Diagnosis not present

## 2012-11-04 DIAGNOSIS — Z1231 Encounter for screening mammogram for malignant neoplasm of breast: Secondary | ICD-10-CM | POA: Diagnosis not present

## 2012-11-10 DIAGNOSIS — H532 Diplopia: Secondary | ICD-10-CM | POA: Diagnosis not present

## 2012-11-10 DIAGNOSIS — H2510 Age-related nuclear cataract, unspecified eye: Secondary | ICD-10-CM | POA: Diagnosis not present

## 2012-11-16 DIAGNOSIS — M79609 Pain in unspecified limb: Secondary | ICD-10-CM | POA: Diagnosis not present

## 2012-11-16 DIAGNOSIS — E039 Hypothyroidism, unspecified: Secondary | ICD-10-CM | POA: Diagnosis not present

## 2012-11-16 DIAGNOSIS — I10 Essential (primary) hypertension: Secondary | ICD-10-CM | POA: Diagnosis not present

## 2012-12-09 ENCOUNTER — Ambulatory Visit: Payer: Medicare Other | Admitting: Physician Assistant

## 2012-12-09 ENCOUNTER — Other Ambulatory Visit: Payer: Self-pay | Admitting: Orthopedic Surgery

## 2012-12-09 DIAGNOSIS — M25551 Pain in right hip: Secondary | ICD-10-CM

## 2012-12-16 ENCOUNTER — Ambulatory Visit
Admission: RE | Admit: 2012-12-16 | Discharge: 2012-12-16 | Disposition: A | Discharge status: Home / Self Care | Payer: Medicare Other | Source: Ambulatory Visit | Attending: Orthopedic Surgery | Admitting: Orthopedic Surgery

## 2012-12-16 DIAGNOSIS — M25551 Pain in right hip: Secondary | ICD-10-CM

## 2012-12-16 DIAGNOSIS — S79919A Unspecified injury of unspecified hip, initial encounter: Secondary | ICD-10-CM | POA: Diagnosis not present

## 2012-12-16 DIAGNOSIS — S79929A Unspecified injury of unspecified thigh, initial encounter: Secondary | ICD-10-CM | POA: Diagnosis not present

## 2012-12-16 DIAGNOSIS — M25559 Pain in unspecified hip: Secondary | ICD-10-CM | POA: Diagnosis not present

## 2012-12-28 ENCOUNTER — Encounter: Payer: Self-pay | Admitting: Obstetrics & Gynecology

## 2012-12-28 ENCOUNTER — Other Ambulatory Visit: Payer: Self-pay | Admitting: Orthopedic Surgery

## 2012-12-28 ENCOUNTER — Ambulatory Visit (INDEPENDENT_AMBULATORY_CARE_PROVIDER_SITE_OTHER): Payer: Medicare Other | Admitting: Gynecology

## 2012-12-28 ENCOUNTER — Encounter: Payer: Self-pay | Admitting: Gynecology

## 2012-12-28 VITALS — BP 124/62 | HR 78 | Resp 16

## 2012-12-28 DIAGNOSIS — M545 Low back pain, unspecified: Secondary | ICD-10-CM | POA: Diagnosis not present

## 2012-12-28 DIAGNOSIS — N644 Mastodynia: Secondary | ICD-10-CM | POA: Diagnosis not present

## 2012-12-28 NOTE — Progress Notes (Signed)
Subjective:     Patient ID: Sabrina Parks, female   DOB: 04/02/1941, 72 y.o.   MRN: [id number]  HPI Comments: Pt here complaining of a 1 month history of right axillary tenderness noticed when pt puts arm down, denies any fullness, cannot palpate anything, she has had a rotator cuff repair on the right years ago.  Pt had recent mammogram at [facility] but was informed it was normal.  Pt denies swelling in extremity or change in rotation or strength.  Pt denies changes to breast, color, skin changes, nipple discharge, etc.  Pt is right handed    Review of Systems     Objective:   Physical Exam  Constitutional: She is oriented to person, place, and time. She appears well-developed and well-nourished.  Pulmonary/Chest:    Neurological: She is alert and oriented to person, place, and time.  no assocaited adenopathy, left breast unremarkable, bot examined supine and upright     Assessment:     Breast mass and tenderness     Plan:     Diagnostic mammogram with u/s at [facility]

## 2012-12-29 ENCOUNTER — Telehealth: Payer: Self-pay | Admitting: *Deleted

## 2012-12-29 ENCOUNTER — Ambulatory Visit (INDEPENDENT_AMBULATORY_CARE_PROVIDER_SITE_OTHER): Payer: Medicare Other | Admitting: Physician Assistant

## 2012-12-29 ENCOUNTER — Encounter: Payer: Self-pay | Admitting: Physician Assistant

## 2012-12-29 VITALS — BP 124/62 | HR 83 | Ht 64.0 in | Wt 146.1 lb

## 2012-12-29 DIAGNOSIS — I10 Essential (primary) hypertension: Secondary | ICD-10-CM

## 2012-12-29 DIAGNOSIS — E785 Hyperlipidemia, unspecified: Secondary | ICD-10-CM

## 2012-12-29 DIAGNOSIS — R002 Palpitations: Secondary | ICD-10-CM

## 2012-12-29 NOTE — Progress Notes (Signed)
[address] [place], [place]  [address] Phone: [phone] Fax:  [phone]  Date:  12/29/2012   ID:  Sabrina Parks, DOB 1941/06/29, MRN [id number]  PCP:  Kari Baars, MD  Cardiologist:  Dr. Delane Ginger     History of Present Illness: Sabrina Parks is a 72 y.o. female who returns for the evaluation of palpitations.  She has a hx of HTN, HL. Stress test in 2011 was negative for ischemia. Echocardiogram 05/2012: Mild LVH, vigorous LVF, EF 65-70%, grade 1 diastolic dysfunction, mild LAE, PASP 19.  Last seen by Dr. Elease Hashimoto 04/2011. Followup as needed was recommended. Patient was admitted to the hospital 05/2012 with slurred speech and altered mental status thought to be secondary to a transient ischemic attack. Head CT and MRI were both negative. Carotid Dopplers were negative for ICA stenosis.  She did f/u with neurology and no clear cause of her symptoms was found (per her report).  MRA in her chart was negative in 07/2012.  She notes episode of rapid palpitations that awoke her from sleep 2 nights in a row some weeks ago.  She did have a glass of wine each night before bed.  But, she has had wine in the past without this effect.  She denies associated symptoms.  She notes her symptoms lasted about 10-15 minutes. She did have a sudden urge to urinate.  She denies recent symptoms of chest pain, dyspnea, syncope, orthopnea, PND, edema.    Labs (11/13):  K 3.7, Cr 0.65, ALT 29, Hgb 13.3   Wt Readings from Last 3 Encounters:  12/29/12 146 lb 1.9 oz (66.28 kg)  06/12/12 145 lb 4.8 oz (65.908 kg)  05/20/11 144 lb 12.8 oz (65.681 kg)     Past Medical History  Diagnosis Date  . Chest pain   . Hyperlipidemia   . Hypertension   . Arthritis   . HSV-1 infection   . Thyroiditis   . Menometrorrhagia 01/1989    TAH BSO  . Alopecia   . Hypercholesteremia   . DJD (degenerative joint disease), cervical 5/02    Elsner  . Liver hemangioma 2011    [name]   Past Surgical  History  Procedure Laterality Date  . Rotator cuff repair Bilateral   . Total vaginal hysterectomy    . US echocardiography  10/11/2009    EF 55-60%  . Cardiovascular stress test  10/29/2006    EF 78%.   . Appendectomy  1956  . Tonsillectomy and adenoidectomy  1957     Current Outpatient Prescriptions  Medication Sig Dispense Refill  . amitriptyline (ELAVIL) 25 MG tablet Take 25 mg by mouth at bedtime.       Marland Kitchen amLODipine (NORVASC) 10 MG tablet Take 5 mg by mouth daily.      Marland Kitchen aspirin 81 MG tablet Take 81 mg by mouth daily.      Marland Kitchen BIOTIN PO Take by mouth.      . Cholecalciferol (VITAMIN D-3 PO) Take 1 tablet by mouth daily.      . hydrochlorothiazide (HYDRODIURIL) 25 MG tablet Take 12.5 mg by mouth daily.       Marland Kitchen ibuprofen (ADVIL,MOTRIN) 200 MG tablet Take 200 mg by mouth every 6 (six) hours as needed. For pain      . levothyroxine (SYNTHROID, LEVOTHROID) 50 MCG tablet Take 50 mcg by mouth daily before breakfast.      . Multiple Vitamin (MULTIVITAMIN) tablet Take 1 tablet by mouth daily.        Marland Kitchen  omeprazole (PRILOSEC) 40 MG capsule Take 40 mg by mouth daily.      . rosuvastatin (CRESTOR) 20 MG tablet Take 20 mg by mouth daily.        Marland Kitchen spironolactone (ALDACTONE) 25 MG tablet Take 1 tablet (25 mg total) by mouth daily.      Marland Kitchen telmisartan (MICARDIS) 40 MG tablet Take 40 mg by mouth daily.       No current facility-administered medications for this visit.    Allergies:    Allergies  Allergen Reactions  . Codeine   . Morphine And Related Hives  . Sulfa Antibiotics Rash    Social History:  The patient  reports that she has quit smoking. She does not have any smokeless tobacco history on file. She reports that she does not drink alcohol or use illicit drugs.   ROS:  Please see the history of present illness.   No hx of snoring.   All other systems reviewed and negative.   PHYSICAL EXAM: VS:  BP 124/62  Pulse 83  Ht 5\' 4"  (1.626 m)  Wt 146 lb 1.9 oz (66.28 kg)  BMI 25.07 kg/m2   LMP 07/29/1990 Well nourished, well developed, in no acute distress HEENT: normal Neck: no JVD Endocrine: no TM Cardiac:  normal S1, S2; RRR; no murmur Lungs:  clear to auscultation bilaterally, no wheezing, rhonchi or rales Abd: soft, nontender, no hepatomegaly Ext: no edema Skin: warm and dry Neuro:  CNs 2-12 intact, no focal abnormalities noted  EKG:  NSR, HR 83, normal axis, diffuse ST changes, no change from prior tracing.     ASSESSMENT AND PLAN:  1. Palpitations:  Symptoms are somewhat suspicious for AFib.  She did have an urge to urinate which can occur with AFib with RVR.  She did have an episode of AMS in 05/2012.  But, her MRI was negative.  She has a CHADS2-VASc=3 (5 if she truly had a TIA).  Her stroke risk is high enough that she would benefit from anticoagulation if she had AFib documented.  Will check a BMET today.  Arrange 21 day event monitor.  TSH was checked recently by her PCP. 2. Hypertension:  Controlled.  Continue current therapy.  3. Hyperlipidemia:  Continue statin. 4. Disposition: F/u with me in 6-8 weeks.   Signed, Tereso Newcomer, PA-C  12/29/2012 3:31 PM

## 2012-12-29 NOTE — Patient Instructions (Addendum)
NO CHANGES IN MEDICATIONS   Your physician has recommended that you wear an event monitor. Event monitors are medical devices that record the heart's electrical activity. Doctors most often [place] these monitors to diagnose arrhythmias. Arrhythmias are problems with the speed or rhythm of the heartbeat. The monitor is a small, portable device. You can wear one while you do your normal daily activities. This is usually used to diagnose what is causing palpitations/syncope (passing out).  LAB TODAY BMET  PLEASE FOLLOW UP WITH SCOTT WEAVER, PAC IN 6-8 WEEKS

## 2012-12-29 NOTE — Telephone Encounter (Signed)
Appointment made for patient at [facility] for June 4th @ 8:00am for right diagnostic mammogram and breast ultrasound. Patient aware of this today.

## 2012-12-30 ENCOUNTER — Telehealth: Payer: Self-pay | Admitting: *Deleted

## 2012-12-30 DIAGNOSIS — N6459 Other signs and symptoms in breast: Secondary | ICD-10-CM | POA: Diagnosis not present

## 2012-12-30 LAB — BASIC METABOLIC PANEL
BUN: 19 mg/dL (ref 6–23)
CO2: 27 mEq/L (ref 19–32)
Calcium: 10 mg/dL (ref 8.4–10.5)
Chloride: 99 mEq/L (ref 96–112)
Creatinine, Ser: 0.8 mg/dL (ref 0.4–1.2)
GFR: 79.64 mL/min (ref >60.00)
Glucose, Bld: 81 mg/dL (ref 70–99)
Potassium: 4.4 mEq/L (ref 3.5–5.1)
Sodium: 136 mEq/L (ref 135–145)

## 2012-12-30 NOTE — Telephone Encounter (Signed)
pt's husband notified about normal lab results with verbal understanding today and said he will let pt know

## 2012-12-30 NOTE — Telephone Encounter (Signed)
Message copied by Tarri Fuller on Wed Dec 30, 2012  2:20 PM ------      Message from: [place], [place] T      Created: Wed Dec 30, 2012  1:46 PM       K+ and creatinine normal      Continue current Rx      Tereso Newcomer, PA-C        12/30/2012 1:46 PM ------

## 2012-12-31 ENCOUNTER — Encounter (INDEPENDENT_AMBULATORY_CARE_PROVIDER_SITE_OTHER): Payer: Medicare Other

## 2012-12-31 ENCOUNTER — Encounter: Payer: Self-pay | Admitting: *Deleted

## 2012-12-31 DIAGNOSIS — R002 Palpitations: Secondary | ICD-10-CM | POA: Diagnosis not present

## 2012-12-31 NOTE — Progress Notes (Signed)
Patient ID: Sabrina Parks, female   DOB: 04/22/1941, 72 y.o.   MRN: [id number] E-cardio Bramer 21 day event monitor placed on patient.

## 2013-01-04 ENCOUNTER — Ambulatory Visit
Admission: RE | Admit: 2013-01-04 | Discharge: 2013-01-04 | Disposition: A | Discharge status: Home / Self Care | Payer: Medicare Other | Source: Ambulatory Visit | Attending: Orthopedic Surgery | Admitting: Orthopedic Surgery

## 2013-01-04 DIAGNOSIS — M545 Low back pain, unspecified: Secondary | ICD-10-CM

## 2013-01-04 DIAGNOSIS — M48061 Spinal stenosis, lumbar region without neurogenic claudication: Secondary | ICD-10-CM | POA: Diagnosis not present

## 2013-01-04 DIAGNOSIS — M47817 Spondylosis without myelopathy or radiculopathy, lumbosacral region: Secondary | ICD-10-CM | POA: Diagnosis not present

## 2013-01-19 DIAGNOSIS — IMO0002 Reserved for concepts with insufficient information to code with codable children: Secondary | ICD-10-CM | POA: Diagnosis not present

## 2013-01-19 DIAGNOSIS — M48061 Spinal stenosis, lumbar region without neurogenic claudication: Secondary | ICD-10-CM | POA: Diagnosis not present

## 2013-01-27 ENCOUNTER — Telehealth: Payer: Self-pay | Admitting: *Deleted

## 2013-01-27 NOTE — Telephone Encounter (Signed)
ecardio results given.

## 2013-02-03 DIAGNOSIS — M48061 Spinal stenosis, lumbar region without neurogenic claudication: Secondary | ICD-10-CM | POA: Diagnosis not present

## 2013-02-03 DIAGNOSIS — IMO0002 Reserved for concepts with insufficient information to code with codable children: Secondary | ICD-10-CM | POA: Diagnosis not present

## 2013-02-08 ENCOUNTER — Ambulatory Visit (INDEPENDENT_AMBULATORY_CARE_PROVIDER_SITE_OTHER): Payer: Medicare Other | Admitting: Physician Assistant

## 2013-02-08 ENCOUNTER — Encounter: Payer: Self-pay | Admitting: Physician Assistant

## 2013-02-08 VITALS — BP 110/60 | HR 75 | Ht 64.0 in | Wt 144.0 lb

## 2013-02-08 DIAGNOSIS — R002 Palpitations: Secondary | ICD-10-CM | POA: Diagnosis not present

## 2013-02-08 NOTE — Progress Notes (Signed)
[address], [address] [place], [place]  [address] Phone: [phone] Fax:  [phone]  Date:  02/08/2013   ID:  Sabrina Parks, DOB 12-30-1940, MRN [id number]  PCP:  Kari Baars, MD  Cardiologist:  Dr. Delane Ginger     History of Present Illness: Sabrina Parks is a 72 y.o. female who returns for f/u on palpitations.  She has a hx of HTN, HL. Stress test in 2011 was negative for ischemia. Echocardiogram 05/2012: Mild LVH, vigorous LVF, EF 65-70%, grade 1 diastolic dysfunction, mild LAE, PASP 19.  She was admitted to the hospital 05/2012 with slurred speech and altered mental status thought to be secondary to a transient ischemic attack. Head CT and MRI were both negative. Carotid Dopplers were negative for ICA stenosis.  She did f/u with neurology and no clear cause of her symptoms was found (per her report).  MRA in her chart was negative in 07/2012.  I saw her last month. She had noted 2 episodes of rapid palpitations that awoke her from sleep. Event monitor was arranged. This demonstrated normal sinus rhythm and occasional PVCs.  She denies any further rapid palpitations.  She has occasional skipped beats.  The patient denies chest pain, shortness of breath, syncope, orthopnea, PND or significant pedal edema.   Labs (11/13):  K 3.7, Cr 0.65, ALT 29, Hgb 13.3  Labs (6/14):    K 4.4, Cr 0.8  Wt Readings from Last 3 Encounters:  02/08/13 144 lb (65.318 kg)  12/29/12 146 lb 1.9 oz (66.28 kg)  06/12/12 145 lb 4.8 oz (65.908 kg)     Past Medical History  Diagnosis Date  . Chest pain   . Hyperlipidemia   . Hypertension   . Arthritis   . HSV-1 infection   . Thyroiditis   . Menometrorrhagia 01/1989    TAH BSO  . Alopecia   . Hypercholesteremia   . DJD (degenerative joint disease), cervical 5/02    [facility]ner  . Liver hemangioma 2011    [name]   Past Surgical History  Procedure Laterality Date  . Rotator cuff repair Bilateral   . Total vaginal hysterectomy    . US  echocardiography  10/11/2009    EF 55-60%  . Cardiovascular stress test  10/29/2006    EF 78%.   . Appendectomy  1956  . Tonsillectomy and adenoidectomy  1957     Current Outpatient Prescriptions  Medication Sig Dispense Refill  . amLODipine (NORVASC) 10 MG tablet Take 5 mg by mouth daily.      Marland Kitchen aspirin 81 MG tablet Take 81 mg by mouth daily.      Marland Kitchen BIOTIN PO Take by mouth.      . Cholecalciferol (VITAMIN D-3 PO) Take 1 tablet by mouth daily.      . hydrochlorothiazide (HYDRODIURIL) 25 MG tablet Take 12.5 mg by mouth daily.       Marland Kitchen ibuprofen (ADVIL,MOTRIN) 200 MG tablet Take 200 mg by mouth every 6 (six) hours as needed. For pain      . levothyroxine (SYNTHROID, LEVOTHROID) 50 MCG tablet Take 50 mcg by mouth daily before breakfast.      . Multiple Vitamin (MULTIVITAMIN) tablet Take 1 tablet by mouth daily.        . nortriptyline (PAMELOR) 25 MG capsule Take 25 mg by mouth at bedtime.      Marland Kitchen omeprazole (PRILOSEC) 40 MG capsule Take 40 mg by mouth daily.      . rosuvastatin (CRESTOR) 20  MG tablet Take 20 mg by mouth daily.        Marland Kitchen spironolactone (ALDACTONE) 25 MG tablet Take 1 tablet (25 mg total) by mouth daily.      Marland Kitchen telmisartan (MICARDIS) 40 MG tablet Take 40 mg by mouth daily.       No current facility-administered medications for this visit.    Allergies:    Allergies  Allergen Reactions  . Codeine   . Morphine And Related Hives  . Sulfa Antibiotics Rash    Social History:  The patient  reports that she has quit smoking. She does not have any smokeless tobacco history on file. She reports that she does not drink alcohol or use illicit drugs.    PHYSICAL EXAM: VS:  BP 110/60  Ht 5\' 4"  (1.626 m)  Wt 144 lb (65.318 kg)  BMI 24.71 kg/m2  LMP 07/29/1990 Well nourished, well developed, in no acute distress HEENT: normal Neck: no JVD Cardiac:  normal S1, S2; RRR; no murmur Lungs:  clear to auscultation bilaterally, no wheezing, rhonchi or rales Abd: soft  Ext: no  edema Skin: warm and dry Neuro:  CNs 2-12 intact, no focal abnormalities noted  EKG:  NSR, HR 75, no changes     ASSESSMENT AND PLAN:  1. Palpitations:   No arrhythmias on event monitor.  Question if she has occasional PVCs resulting in symptoms.  No change in Rx.  ASA QD is fine for now. 2. Hypertension:  Controlled.  Continue current therapy.  3. Hyperlipidemia:  Continue statin. 4. Disposition: F/u with Dr. Delane Ginger PRN.   Signed, Tereso Newcomer, PA-C  02/08/2013 10:23 AM

## 2013-02-08 NOTE — Patient Instructions (Addendum)
Follow up as needed

## 2013-02-22 DIAGNOSIS — M545 Low back pain, unspecified: Secondary | ICD-10-CM | POA: Diagnosis not present

## 2013-02-22 DIAGNOSIS — M48061 Spinal stenosis, lumbar region without neurogenic claudication: Secondary | ICD-10-CM | POA: Diagnosis not present

## 2013-02-22 DIAGNOSIS — M533 Sacrococcygeal disorders, not elsewhere classified: Secondary | ICD-10-CM | POA: Diagnosis not present

## 2013-04-19 DIAGNOSIS — Z23 Encounter for immunization: Secondary | ICD-10-CM | POA: Diagnosis not present

## 2013-05-03 DIAGNOSIS — L819 Disorder of pigmentation, unspecified: Secondary | ICD-10-CM | POA: Diagnosis not present

## 2013-05-03 DIAGNOSIS — L738 Other specified follicular disorders: Secondary | ICD-10-CM | POA: Diagnosis not present

## 2013-05-03 DIAGNOSIS — Z85828 Personal history of other malignant neoplasm of skin: Secondary | ICD-10-CM | POA: Diagnosis not present

## 2013-05-03 DIAGNOSIS — L659 Nonscarring hair loss, unspecified: Secondary | ICD-10-CM | POA: Diagnosis not present

## 2013-05-03 DIAGNOSIS — L570 Actinic keratosis: Secondary | ICD-10-CM | POA: Diagnosis not present

## 2013-05-03 DIAGNOSIS — L678 Other hair color and hair shaft abnormalities: Secondary | ICD-10-CM | POA: Diagnosis not present

## 2013-05-24 ENCOUNTER — Ambulatory Visit (INDEPENDENT_AMBULATORY_CARE_PROVIDER_SITE_OTHER): Payer: Medicare Other | Admitting: Obstetrics & Gynecology

## 2013-05-24 ENCOUNTER — Encounter: Payer: Self-pay | Admitting: Obstetrics & Gynecology

## 2013-05-24 VITALS — BP 116/58 | HR 60 | Resp 16 | Ht 63.5 in | Wt 143.6 lb

## 2013-05-24 DIAGNOSIS — Z01419 Encounter for gynecological examination (general) (routine) without abnormal findings: Secondary | ICD-10-CM | POA: Diagnosis not present

## 2013-05-24 NOTE — Patient Instructions (Signed)

## 2013-05-24 NOTE — Progress Notes (Signed)
72 y.o. [id number] MarriedCaucasianF here for annual exam.  Pt palpated area under R axilla.  Was sent to [facility] for diagnostic imaging.  Cannot find in EMR.  Pt reports that testing was negative.  Saw Dr. Clelia Croft in [place].  Has follow-up in November.  Having some thyroid fluctuation.    Last November she had an "episode" of blurry vision and memory loss for hours.  Diagnosed with possible TIA vs transient global amnesia.  Doesn't fit criteria for either.  Spent the night.  Had MRI which was negative.  No issues since.  Patient's last menstrual period was 07/29/1990.          Sexually active: yes  The current method of family planning is status post hysterectomy.    Exercising: yes  deep water aerobics and walking Smoker:  no  Health Maintenance: Pap:  09/10/02 WNL History of abnormal Pap:  no MMG:  2014 [facility] Colonoscopy:  2011 repeat in 5 years, [name] BMD:   2013 Dr Clelia Croft TDaP:  9/08  Screening Labs: PCP, Hb today: PCP, Urine today: PCP   reports that she has quit smoking. She has never used smokeless tobacco. She reports that she drinks alcohol. She reports that she does not use illicit drugs.  Past Medical History  Diagnosis Date  . Chest pain   . Hyperlipidemia   . Hypertension   . Arthritis   . HSV-1 infection   . Thyroiditis   . Menometrorrhagia 01/1989    TAH BSO  . Alopecia   . Hypercholesteremia   . DJD (degenerative joint disease), cervical 5/02    [facility]lsner  . Liver hemangioma 2011    [name]    Past Surgical History  Procedure Laterality Date  . Rotator cuff repair Bilateral   . Total vaginal hysterectomy    . US echocardiography  10/11/2009    EF 55-60%  . Cardiovascular stress test  10/29/2006    EF 78%.   . Appendectomy  1956  . Tonsillectomy and adenoidectomy  1957    Current Outpatient Prescriptions  Medication Sig Dispense Refill  . amLODipine (NORVASC) 10 MG tablet Take 5 mg by mouth daily.      Marland Kitchen aspirin 81 MG tablet Take 81 mg by mouth daily.       Marland Kitchen BIOTIN PO Take by mouth.      . Cholecalciferol (VITAMIN D-3 PO) Take 1 tablet by mouth daily.      . hydrochlorothiazide (HYDRODIURIL) 25 MG tablet Take 12.5 mg by mouth daily.       Marland Kitchen ibuprofen (ADVIL,MOTRIN) 200 MG tablet Take 200 mg by mouth every 6 (six) hours as needed. For pain      . levothyroxine (SYNTHROID, LEVOTHROID) 50 MCG tablet Take 50 mcg by mouth daily before breakfast.      . Multiple Vitamin (MULTIVITAMIN) tablet Take 1 tablet by mouth daily.        . nortriptyline (PAMELOR) 25 MG capsule Take 25 mg by mouth at bedtime.      Marland Kitchen omeprazole (PRILOSEC) 40 MG capsule Take 40 mg by mouth daily.      . rosuvastatin (CRESTOR) 10 MG tablet Take 10 mg by mouth daily.      Marland Kitchen spironolactone (ALDACTONE) 25 MG tablet Take 1 tablet (25 mg total) by mouth daily.      Marland Kitchen telmisartan (MICARDIS) 40 MG tablet Take 40 mg by mouth daily.       No current facility-administered medications for this visit.  Family History  Problem Relation Age of Onset  . Stroke Mother   . Cancer Father   . Colon cancer Father     80's    ROS:  Pertinent items are noted in HPI.  Otherwise, a comprehensive ROS was negative.  Exam:   BP 116/58  Pulse 60  Resp 16  Ht 5' 3.5" (1.613 m)  Wt 143 lb 9.6 oz (65.137 kg)  BMI 25.04 kg/m2  LMP 07/29/1990  Weight change: -4lbs  Height: 5' 3.5" (161.3 cm)  Ht Readings from Last 3 Encounters:  05/24/13 5' 3.5" (1.613 m)  02/08/13 5\' 4"  (1.626 m)  12/29/12 5\' 4"  (1.626 m)    General appearance: alert, cooperative and appears stated age Head: Normocephalic, without obvious abnormality, atraumatic Neck: no adenopathy, supple, symmetrical, trachea midline and thyroid normal to inspection and palpation Lungs: clear to auscultation bilaterally Breasts: normal appearance, no masses or tenderness, except for 1.5cm smooth lesion deep in right axilla Heart: regular rate and rhythm Abdomen: soft, non-tender; bowel sounds normal; no masses,  no  organomegaly Extremities: extremities normal, atraumatic, no cyanosis or edema Skin: Skin color, texture, turgor normal. No rashes or lesions Lymph nodes: Cervical, supraclavicular, and axillary nodes normal. No abnormal inguinal nodes palpated Neurologic: Grossly normal   Pelvic: External genitalia:  no lesions              Urethra:  normal appearing urethra with no masses, tenderness or lesions              Bartholins and Skenes: normal                 Vagina: normal appearing vagina with normal color and discharge, no lesions              Cervix: absent              Pap taken: no Bimanual Exam:  Uterus:  uterus absent              Adnexa: no mass, fullness, tenderness               Rectovaginal: Confirms               Anus:  normal sphincter tone, no lesions  A:  Well Woman with normal exam 1.5cm deep right axillary nodule.  Imaging 6/14.  Cannot see in EPIC.  Will call for results.  Plan PUS of right breast/axilla H/O TVH/BSO Htn Elevated lipids Transient global amnesia? 11/13.  Complete evaluation including CT, MRI/A, carotid dopplers, echo, labs normal.  P:   Mammogram due in April.  Right u/s as per above pap smear not indicated Diagnostic breast imaging. return annually or prn  An After Visit Summary was printed and given to the patient.

## 2013-05-31 DIAGNOSIS — N6459 Other signs and symptoms in breast: Secondary | ICD-10-CM | POA: Diagnosis not present

## 2013-06-02 ENCOUNTER — Other Ambulatory Visit: Payer: Self-pay | Admitting: Radiology

## 2013-06-02 DIAGNOSIS — R222 Localized swelling, mass and lump, trunk: Secondary | ICD-10-CM

## 2013-06-09 ENCOUNTER — Ambulatory Visit
Admission: RE | Admit: 2013-06-09 | Discharge: 2013-06-09 | Disposition: A | Discharge status: Home / Self Care | Payer: Medicare Other | Source: Ambulatory Visit | Attending: Radiology | Admitting: Radiology

## 2013-06-09 DIAGNOSIS — R222 Localized swelling, mass and lump, trunk: Secondary | ICD-10-CM

## 2013-06-09 DIAGNOSIS — R911 Solitary pulmonary nodule: Secondary | ICD-10-CM | POA: Diagnosis not present

## 2013-06-09 MED ORDER — GADOBENATE DIMEGLUMINE 529 MG/ML IV SOLN
13.0000 mL | Freq: Once | INTRAVENOUS | Status: AC | PRN
  Administered 2013-06-09: 13 mL via INTRAVENOUS

## 2013-06-14 ENCOUNTER — Telehealth: Payer: Self-pay | Admitting: Emergency Medicine

## 2013-06-14 NOTE — Telephone Encounter (Signed)
Message copied by Joeseph Amor on Mon Jun 14, 2013 10:56 AM ------      Message from: Jerene Bears      Created: Fri Jun 11, 2013 12:46 AM       MRI shows possible lipoma in right axilla.  This is benign but it is not a definitive result.  Nothing worrisome was seen.  I think she should either 1)let me recheck in 2-3 months and ensure the area is not enlarging or 2)let me refer to breast surgeon for possible excision.            MSM      ----- Message -----         From: Almedia Balls, RN         Sent: 06/09/2013   2:20 PM           To: Annamaria Boots, MD            I spoke with [facility] and they have sent this patient for a breast MRI that was scheduled for today at [facility]. At this time there are no other follow up reports.              ------

## 2013-06-14 NOTE — Telephone Encounter (Signed)
This does need to into recall.  Put in the notes "breast check for follow up after MMG".

## 2013-06-14 NOTE — Telephone Encounter (Signed)
Spoke with patient and message from Dr. Hyacinth Meeker given. Patient requests follow up breast recheck for January. Appointment scheduled.   Routing to provider for final review. Patient agreeable to disposition. Will close encounter

## 2013-06-14 NOTE — Telephone Encounter (Signed)
Recall entered  °

## 2013-06-17 ENCOUNTER — Encounter: Payer: Self-pay | Admitting: Internal Medicine

## 2013-06-17 DIAGNOSIS — E039 Hypothyroidism, unspecified: Secondary | ICD-10-CM | POA: Diagnosis not present

## 2013-06-17 DIAGNOSIS — E785 Hyperlipidemia, unspecified: Secondary | ICD-10-CM | POA: Diagnosis not present

## 2013-06-17 DIAGNOSIS — R7301 Impaired fasting glucose: Secondary | ICD-10-CM | POA: Diagnosis not present

## 2013-06-17 DIAGNOSIS — I10 Essential (primary) hypertension: Secondary | ICD-10-CM | POA: Diagnosis not present

## 2013-06-21 DIAGNOSIS — H25019 Cortical age-related cataract, unspecified eye: Secondary | ICD-10-CM | POA: Diagnosis not present

## 2013-06-21 DIAGNOSIS — H40019 Open angle with borderline findings, low risk, unspecified eye: Secondary | ICD-10-CM | POA: Diagnosis not present

## 2013-06-21 DIAGNOSIS — H2510 Age-related nuclear cataract, unspecified eye: Secondary | ICD-10-CM | POA: Diagnosis not present

## 2013-06-21 DIAGNOSIS — H5200 Hypermetropia, unspecified eye: Secondary | ICD-10-CM | POA: Diagnosis not present

## 2013-06-23 ENCOUNTER — Other Ambulatory Visit (HOSPITAL_COMMUNITY): Payer: Self-pay | Admitting: Internal Medicine

## 2013-06-23 DIAGNOSIS — Z23 Encounter for immunization: Secondary | ICD-10-CM | POA: Diagnosis not present

## 2013-06-23 DIAGNOSIS — Z1331 Encounter for screening for depression: Secondary | ICD-10-CM | POA: Diagnosis not present

## 2013-06-23 DIAGNOSIS — K219 Gastro-esophageal reflux disease without esophagitis: Secondary | ICD-10-CM | POA: Diagnosis not present

## 2013-06-23 DIAGNOSIS — Z0000 Encounter for general adult medical examination without abnormal findings: Secondary | ICD-10-CM | POA: Diagnosis not present

## 2013-06-23 DIAGNOSIS — R0989 Other specified symptoms and signs involving the circulatory and respiratory systems: Secondary | ICD-10-CM

## 2013-06-23 DIAGNOSIS — R0609 Other forms of dyspnea: Secondary | ICD-10-CM | POA: Diagnosis not present

## 2013-06-23 DIAGNOSIS — E785 Hyperlipidemia, unspecified: Secondary | ICD-10-CM | POA: Diagnosis not present

## 2013-06-23 DIAGNOSIS — R7301 Impaired fasting glucose: Secondary | ICD-10-CM | POA: Diagnosis not present

## 2013-06-23 DIAGNOSIS — Z8679 Personal history of other diseases of the circulatory system: Secondary | ICD-10-CM | POA: Diagnosis not present

## 2013-06-23 DIAGNOSIS — E039 Hypothyroidism, unspecified: Secondary | ICD-10-CM | POA: Diagnosis not present

## 2013-06-23 DIAGNOSIS — I10 Essential (primary) hypertension: Secondary | ICD-10-CM | POA: Diagnosis not present

## 2013-06-23 LAB — PULMONARY FUNCTION TEST
DL/VA % pred: 101 %
DL/VA: 4.73 ml/min/mmHg/L
DLCO unc % pred: 87 %
DLCO unc: 20.01 ml/min/mmHg
FEF 25-75 Post: 1.54 L/sec
FEF 25-75 Pre: 1.21 L/sec
FEF2575-%Change-Post: 27 %
FEF2575-%Pred-Post: 87 %
FEF2575-%Pred-Pre: 69 %
FEV1-%Change-Post: 5 %
FEV1-%Pred-Post: 92 %
FEV1-%Pred-Pre: 87 %
FEV1-Post: 1.94 L
FEV1-Pre: 1.85 L
FEV1FVC-%Change-Post: 0 %
FEV1FVC-%Pred-Pre: 95 %
FEV6-%Change-Post: 5 %
FEV6-%Pred-Post: 99 %
FEV6-%Pred-Pre: 95 %
FEV6-Post: 2.66 L
FEV6-Pre: 2.54 L
FEV6FVC-%Change-Post: 0 %
FEV6FVC-%Pred-Post: 105 %
FEV6FVC-%Pred-Pre: 104 %
FVC-%Change-Post: 4 %
FVC-%Pred-Post: 94 %
FVC-%Pred-Pre: 91 %
FVC-Post: 2.67 L
FVC-Pre: 2.56 L
Post FEV1/FVC ratio: 73 %
Post FEV6/FVC ratio: 100 %
Pre FEV1/FVC ratio: 72 %
Pre FEV6/FVC Ratio: 99 %
RV % pred: 228 %
RV: 4.97 L
TLC % pred: 157 %
TLC: 7.72 L

## 2013-06-28 DIAGNOSIS — R197 Diarrhea, unspecified: Secondary | ICD-10-CM | POA: Diagnosis not present

## 2013-07-01 ENCOUNTER — Other Ambulatory Visit: Payer: Self-pay | Admitting: Dermatology

## 2013-07-01 DIAGNOSIS — Z85828 Personal history of other malignant neoplasm of skin: Secondary | ICD-10-CM | POA: Diagnosis not present

## 2013-07-01 DIAGNOSIS — L570 Actinic keratosis: Secondary | ICD-10-CM | POA: Diagnosis not present

## 2013-07-06 ENCOUNTER — Encounter: Payer: Self-pay | Admitting: Internal Medicine

## 2013-07-06 DIAGNOSIS — Z1212 Encounter for screening for malignant neoplasm of rectum: Secondary | ICD-10-CM | POA: Diagnosis not present

## 2013-07-06 LAB — IFOBT (OCCULT BLOOD): IFOBT: NEGATIVE

## 2013-07-07 ENCOUNTER — Encounter: Payer: Self-pay | Admitting: Obstetrics & Gynecology

## 2013-07-14 ENCOUNTER — Ambulatory Visit (HOSPITAL_COMMUNITY)
Admission: RE | Admit: 2013-07-14 | Discharge: 2013-07-14 | Disposition: A | Discharge status: Home / Self Care | Payer: Medicare Other | Source: Ambulatory Visit | Attending: Internal Medicine | Admitting: Internal Medicine

## 2013-07-14 DIAGNOSIS — R0609 Other forms of dyspnea: Secondary | ICD-10-CM | POA: Insufficient documentation

## 2013-07-14 DIAGNOSIS — R0989 Other specified symptoms and signs involving the circulatory and respiratory systems: Secondary | ICD-10-CM | POA: Diagnosis not present

## 2013-07-14 MED ORDER — ALBUTEROL SULFATE (5 MG/ML) 0.5% IN NEBU
2.5000 mg | INHALATION_SOLUTION | Freq: Once | RESPIRATORY_TRACT | Status: AC
  Administered 2013-07-14: 2.5 mg via RESPIRATORY_TRACT

## 2013-07-15 DIAGNOSIS — R197 Diarrhea, unspecified: Secondary | ICD-10-CM | POA: Diagnosis not present

## 2013-08-04 DIAGNOSIS — K5900 Constipation, unspecified: Secondary | ICD-10-CM | POA: Diagnosis not present

## 2013-08-04 DIAGNOSIS — R197 Diarrhea, unspecified: Secondary | ICD-10-CM | POA: Diagnosis not present

## 2013-08-04 DIAGNOSIS — R195 Other fecal abnormalities: Secondary | ICD-10-CM | POA: Diagnosis not present

## 2013-08-12 DIAGNOSIS — D126 Benign neoplasm of colon, unspecified: Secondary | ICD-10-CM | POA: Diagnosis not present

## 2013-08-12 DIAGNOSIS — Z86010 Personal history of colonic polyps: Secondary | ICD-10-CM | POA: Diagnosis not present

## 2013-08-12 DIAGNOSIS — R197 Diarrhea, unspecified: Secondary | ICD-10-CM | POA: Diagnosis not present

## 2013-08-18 DIAGNOSIS — J45909 Unspecified asthma, uncomplicated: Secondary | ICD-10-CM | POA: Diagnosis not present

## 2013-08-18 DIAGNOSIS — Z6825 Body mass index (BMI) 25.0-25.9, adult: Secondary | ICD-10-CM | POA: Diagnosis not present

## 2013-08-18 DIAGNOSIS — Z86010 Personal history of colonic polyps: Secondary | ICD-10-CM | POA: Diagnosis not present

## 2013-08-19 DIAGNOSIS — L299 Pruritus, unspecified: Secondary | ICD-10-CM | POA: Diagnosis not present

## 2013-08-19 DIAGNOSIS — L259 Unspecified contact dermatitis, unspecified cause: Secondary | ICD-10-CM | POA: Diagnosis not present

## 2013-08-19 DIAGNOSIS — Z85828 Personal history of other malignant neoplasm of skin: Secondary | ICD-10-CM | POA: Diagnosis not present

## 2013-08-20 ENCOUNTER — Ambulatory Visit (INDEPENDENT_AMBULATORY_CARE_PROVIDER_SITE_OTHER): Payer: Medicare Other | Admitting: Obstetrics & Gynecology

## 2013-08-20 ENCOUNTER — Encounter: Payer: Self-pay | Admitting: Obstetrics & Gynecology

## 2013-08-20 VITALS — BP 122/60 | HR 68 | Resp 16 | Ht 63.5 in | Wt 147.4 lb

## 2013-08-20 DIAGNOSIS — N644 Mastodynia: Secondary | ICD-10-CM

## 2013-08-20 NOTE — Progress Notes (Signed)
Subjective:     Patient ID: Sabrina Parks, female   DOB: Oct 05, 1940, 73 y.o.   MRN: [id number]  HPI 73 y.o. [id number] MarriedCaucasianF here for breast recheck.  Pt had area under R axilla that she felt 6/14.  Was sent to [facility] for diagnostic imaging 12/30/12, including, ultrasound, that was negative.  When I saw her in November, I felt a very obvious 1.5cm lesions right axilla.  As MMG was negative, I sent her for MRI which showed small lymph nodes and a possible subcutaneous lipoma.  No abnormal lesions noted.  Pt has not been feeling this area and reports tenderness is better.  Routine screening MMG due 4/15.  No appt scheduled but pt usually schedules after she gets her reminder card.  Review of Systems  All other systems reviewed and are negative.       Objective:   Physical Exam  Constitutional: She appears well-developed and well-nourished.  Neck: Normal range of motion. Neck supple. No JVD present. No tracheal deviation present. No thyromegaly present.  Pulmonary/Chest: Right breast exhibits no inverted nipple, no mass, no nipple discharge, no skin change and no tenderness. Left breast exhibits no inverted nipple, no mass, no nipple discharge, no skin change and no tenderness. Breasts are symmetrical.    Lymphadenopathy:    She has no cervical adenopathy.       Assessment:     Right breast/axillary mass, resolved.  Decreased breast tenderness     Plan:     Pt will go for screening MMG 4/15 and be seen 10/15 for AEX.      73 y.o. [id number] MarriedCaucasianF here for annual exam.  Pt palpated area under R axilla.  Was sent to [facility] for diagnostic imaging.  Cannot find in EMR.  Pt reports that testing was negative.  Saw Dr. Brigitte Pulse in [place].  Has follow-up in November.  Having some thyroid fluctuation.    Last November she had an "episode" of blurry vision and memory loss for hours.  Diagnosed with possible TIA vs transient global amnesia.  Doesn't fit criteria for either.  Spent  the night.  Had MRI which was negative.  No issues since.  Patient's last menstrual period was 07/29/1990.          Sexually active: yes  The current method of family planning is status post hysterectomy.    Exercising: yes  deep water aerobics and walking Smoker:  no  Health Maintenance: Pap:  09/10/02 WNL History of abnormal Pap:  no MMG:  2014 [facility] Colonoscopy:  2011 repeat in 5 years, Dr. Earlean Shawl BMD:   2013 Dr Brigitte Pulse TDaP:  9/08  Screening Labs: PCP, Hb today: PCP, Urine today: PCP   reports that she has quit smoking. She has never used smokeless tobacco. She reports that she drinks alcohol. She reports that she does not use illicit drugs.  Past Medical History  Diagnosis Date  . Chest pain   . Hyperlipidemia   . Hypertension   . Arthritis   . HSV-1 infection   . Thyroiditis   . Menometrorrhagia 01/1989    TAH BSO  . Alopecia   . Hypercholesteremia   . DJD (degenerative joint disease), cervical 5/02    Elsner  . Liver hemangioma 2011    Dr. Earlean Shawl  . Transient global amnesia     not fully diag    Past Surgical History  Procedure Laterality Date  . Rotator cuff repair Bilateral   . Total abdominal hysterectomy w/ bilateral salpingoophorectomy  7/90  . US echocardiography  10/11/2009    EF 55-60%  . Cardiovascular stress test  10/29/2006    EF 78%.   . Appendectomy  1956  . Tonsillectomy and adenoidectomy  1957    Current Outpatient Prescriptions  Medication Sig Dispense Refill  . amitriptyline (ELAVIL) 25 MG tablet 25 mg daily.      Marland Kitchen amLODipine (NORVASC) 10 MG tablet Take 5 mg by mouth daily.      Marland Kitchen aspirin 81 MG tablet Take 81 mg by mouth daily.      Marland Kitchen BIOTIN PO Take by mouth.      . Cholecalciferol (VITAMIN D-3 PO) Take 1 tablet by mouth daily.      . fluticasone (FLONASE) 50 MCG/ACT nasal spray as needed.      . hydrochlorothiazide (HYDRODIURIL) 25 MG tablet Take 12.5 mg by mouth daily.       Marland Kitchen ibuprofen (ADVIL,MOTRIN) 200 MG tablet Take 200 mg by mouth  every 6 (six) hours as needed. For pain      . levothyroxine (SYNTHROID, LEVOTHROID) 50 MCG tablet Take 50 mcg by mouth daily before breakfast.      . LORazepam (ATIVAN) 0.5 MG tablet as needed.      . Multiple Vitamin (MULTIVITAMIN) tablet Take 1 tablet by mouth daily.        Marland Kitchen omeprazole (PRILOSEC) 40 MG capsule Take 40 mg by mouth daily.      Marland Kitchen PROAIR HFA 108 (90 BASE) MCG/ACT inhaler       . rosuvastatin (CRESTOR) 10 MG tablet Take 10 mg by mouth daily.      Marland Kitchen telmisartan (MICARDIS) 40 MG tablet Take 40 mg by mouth daily.      . valsartan-hydrochlorothiazide (DIOVAN-HCT) 160-25 MG per tablet 1/2 tablet daily       No current facility-administered medications for this visit.    Family History  Problem Relation Age of Onset  . Stroke Mother   . Cancer Father   . Colon cancer Father     62's    ROS:  Pertinent items are noted in HPI.  Otherwise, a comprehensive ROS was negative.  Exam:   BP 122/60  Pulse 68  Resp 16  Ht 5' 3.5" (1.613 m)  Wt 147 lb 6.4 oz (66.86 kg)  BMI 25.70 kg/m2  LMP 07/29/1990  Weight change: -4lbs  Height: 5' 3.5" (161.3 cm)  Ht Readings from Last 3 Encounters:  08/20/13 5' 3.5" (1.613 m)  05/24/13 5' 3.5" (1.613 m)  02/08/13 5\' 4"  (1.626 m)    General appearance: alert, cooperative and appears stated age Head: Normocephalic, without obvious abnormality, atraumatic Neck: no adenopathy, supple, symmetrical, trachea midline and thyroid normal to inspection and palpation Lungs: clear to auscultation bilaterally Breasts: normal appearance, no masses or tenderness, except for 1.5cm smooth lesion deep in right axilla Heart: regular rate and rhythm Abdomen: soft, non-tender; bowel sounds normal; no masses,  no organomegaly Extremities: extremities normal, atraumatic, no cyanosis or edema Skin: Skin color, texture, turgor normal. No rashes or lesions Lymph nodes: Cervical, supraclavicular, and axillary nodes normal. No abnormal inguinal nodes  palpated Neurologic: Grossly normal   Pelvic: External genitalia:  no lesions              Urethra:  normal appearing urethra with no masses, tenderness or lesions              Bartholins and Skenes: normal  Vagina: normal appearing vagina with normal color and discharge, no lesions              Cervix: absent              Pap taken: no Bimanual Exam:  Uterus:  uterus absent              Adnexa: no mass, fullness, tenderness               Rectovaginal: Confirms               Anus:  normal sphincter tone, no lesions  A:  Well Woman with normal exam 1.5cm deep right axillary nodule.  Imaging 6/14.  Cannot see in EPIC.  Will call for results.  Plan PUS of right breast/axilla H/O TVH/BSO Htn Elevated lipids Transient global amnesia? 11/13.  Complete evaluation including CT, MRI/A, carotid dopplers, echo, labs normal.  P:   Mammogram due in April.  Right u/s as per above pap smear not indicated Diagnostic breast imaging. return annually or prn  An After Visit Summary was printed and given to the patient.

## 2013-08-20 NOTE — Patient Instructions (Signed)
Please call with any new problems/issues.

## 2013-08-24 DIAGNOSIS — K589 Irritable bowel syndrome without diarrhea: Secondary | ICD-10-CM | POA: Diagnosis not present

## 2013-08-27 DIAGNOSIS — M7700 Medial epicondylitis, unspecified elbow: Secondary | ICD-10-CM | POA: Diagnosis not present

## 2013-10-06 ENCOUNTER — Other Ambulatory Visit: Payer: Self-pay | Admitting: Dermatology

## 2013-10-06 DIAGNOSIS — L821 Other seborrheic keratosis: Secondary | ICD-10-CM | POA: Diagnosis not present

## 2013-10-06 DIAGNOSIS — Z85828 Personal history of other malignant neoplasm of skin: Secondary | ICD-10-CM | POA: Diagnosis not present

## 2013-10-06 DIAGNOSIS — D485 Neoplasm of uncertain behavior of skin: Secondary | ICD-10-CM | POA: Diagnosis not present

## 2013-10-06 DIAGNOSIS — L819 Disorder of pigmentation, unspecified: Secondary | ICD-10-CM | POA: Diagnosis not present

## 2013-10-06 DIAGNOSIS — L570 Actinic keratosis: Secondary | ICD-10-CM | POA: Diagnosis not present

## 2013-10-11 ENCOUNTER — Ambulatory Visit (INDEPENDENT_AMBULATORY_CARE_PROVIDER_SITE_OTHER): Payer: Medicare Other | Admitting: Internal Medicine

## 2013-10-11 ENCOUNTER — Encounter: Payer: Self-pay | Admitting: Internal Medicine

## 2013-10-11 VITALS — BP 130/66 | HR 73 | Ht 63.0 in | Wt 144.0 lb

## 2013-10-11 DIAGNOSIS — R002 Palpitations: Secondary | ICD-10-CM | POA: Diagnosis not present

## 2013-10-11 NOTE — Progress Notes (Signed)
HPI PCP: Sabrina Rouse, MD  Cardiologist: Dr. Liam Parks  History of Present Illness:   Patient is a 73 yo who was previously followed by Sabrina Parks.  She is mother to Sabrina Parks She has a history of palpitations, HTN, HL  Stress test in 2011 was neg for ischemia.  Echo in Nov 2013:  Mild LVH  LVEF 65 to 87%, Gr I diastolic dysfunction.   Hosp once in November 2013 with ? Slurred speech and memory problems  MRA negative. Carotid USN negative.  Neuro also evaluated.   She was last seen by Sabrina Parks in July 2014 Since seen she has done well  She denies CP  IS active  Does deep water aerobics.   Walks   No SOB No memory events like the past Occasional palpitations  Isolated  Allergies  Allergen Reactions  . Codeine   . Morphine And Related Hives  . Sulfa Antibiotics Rash    Current Outpatient Prescriptions  Medication Sig Dispense Refill  . amitriptyline (ELAVIL) 25 MG tablet 25 mg daily.      Marland Kitchen amLODipine (NORVASC) 10 MG tablet Take 5 mg by mouth daily.      Marland Kitchen aspirin 81 MG tablet Take 81 mg by mouth daily.      Marland Kitchen BIOTIN PO Take by mouth.      . Cholecalciferol (VITAMIN D-3 PO) Take 1 tablet by mouth daily.      . fluticasone (FLONASE) 50 MCG/ACT nasal spray as needed.      . hydrochlorothiazide (HYDRODIURIL) 25 MG tablet Take 12.5 mg by mouth daily.       Marland Kitchen ibuprofen (ADVIL,MOTRIN) 200 MG tablet Take 200 mg by mouth every 6 (six) hours as needed. For pain      . levothyroxine (SYNTHROID, LEVOTHROID) 50 MCG tablet Take 50 mcg by mouth daily before breakfast.      . LORazepam (ATIVAN) 0.5 MG tablet as needed.      . Multiple Vitamin (MULTIVITAMIN) tablet Take 1 tablet by mouth daily.        Marland Kitchen omeprazole (PRILOSEC) 40 MG capsule Take 40 mg by mouth daily.      Marland Kitchen PROAIR HFA 108 (90 BASE) MCG/ACT inhaler       . rosuvastatin (CRESTOR) 10 MG tablet Take 10 mg by mouth daily.      Marland Kitchen telmisartan (MICARDIS) 40 MG tablet Take 40 mg by mouth daily.      .  valsartan-hydrochlorothiazide (DIOVAN-HCT) 160-25 MG per tablet 1/2 tablet daily       No current facility-administered medications for this visit.    Past Medical History  Diagnosis Date  . Chest pain   . Hyperlipidemia   . Hypertension   . Arthritis   . HSV-1 infection   . Thyroiditis   . Menometrorrhagia 01/1989    TAH BSO  . Alopecia   . Hypercholesteremia   . DJD (degenerative joint disease), cervical 5/02    Sabrina Parks  . Liver hemangioma 2011    Dr. Earlean Parks  . Transient global amnesia     not fully diag    Past Surgical History  Procedure Laterality Date  . Rotator cuff repair Bilateral   . Total abdominal hysterectomy w/ bilateral salpingoophorectomy  7/90  . US echocardiography  10/11/2009    EF 55-60%  . Cardiovascular stress test  10/29/2006    EF 78%.   . Appendectomy  1956  . Tonsillectomy and adenoidectomy  1957    Family History  Problem Relation  Age of Onset  . Stroke Mother   . Cancer Father   . Colon cancer Father     79's    History   Social History  . Marital Status: Married    Spouse Name: Sabrina Parks    Number of Children: Sabrina Parks  . Years of Education: Sabrina Parks   Occupational History  . Not on file.   Social History Main Topics  . Smoking status: Former Research scientist (life sciences)  . Smokeless tobacco: Never Used  . Alcohol Use: Yes     Comment: 1-2 glasses of wine   . Drug Use: No  . Sexual Activity: Yes    Partners: Male   Other Topics Concern  . Not on file   Social History Narrative  . No narrative on file    Review of Systems:  All systems reviewed.  They are negative to the above problem except as previously stated.  Vital Signs: BP 130/66  Pulse 73  Ht 5\' 3"  (1.6 m)  Wt 144 lb (65.318 kg)  BMI 25.51 kg/m2  LMP 07/29/1990  Physical Exam Patient is in NAD HEENT:  Normocephalic, atraumatic. EOMI, PERRLA.  Neck: JVP is normal.  No bruits.  Lungs: clear to auscultation. No rales no wheezes.  Heart: Regular rate and rhythm. Normal S1, S2. No S3.   No  significant murmurs. PMI not displaced.  Abdomen:  Supple, nontender. Normal bowel sounds. No masses. No hepatomegaly.  Extremities:   Good distal pulses throughout. No lower extremity edema.  Musculoskeletal :moving all extremities.  Neuro:   alert and oriented x3.  CN II-XII grossly intact.  EKG SR 73  Septal MI  Assessment and Plan:  1.  Palpitatoins  Sound like isolated PACs  Avoid stimulants  Stay hydrated  Stay active 2.  HL  Will get labs form [facility] 3.  HTN  Good control    Will follow up in 1 year.

## 2013-11-15 DIAGNOSIS — R928 Other abnormal and inconclusive findings on diagnostic imaging of breast: Secondary | ICD-10-CM | POA: Diagnosis not present

## 2013-11-15 DIAGNOSIS — N63 Unspecified lump in unspecified breast: Secondary | ICD-10-CM | POA: Diagnosis not present

## 2013-12-06 DIAGNOSIS — H25019 Cortical age-related cataract, unspecified eye: Secondary | ICD-10-CM | POA: Diagnosis not present

## 2013-12-06 DIAGNOSIS — H40019 Open angle with borderline findings, low risk, unspecified eye: Secondary | ICD-10-CM | POA: Diagnosis not present

## 2013-12-06 DIAGNOSIS — H2510 Age-related nuclear cataract, unspecified eye: Secondary | ICD-10-CM | POA: Diagnosis not present

## 2013-12-15 DIAGNOSIS — H2589 Other age-related cataract: Secondary | ICD-10-CM | POA: Diagnosis not present

## 2013-12-15 DIAGNOSIS — H25019 Cortical age-related cataract, unspecified eye: Secondary | ICD-10-CM | POA: Diagnosis not present

## 2013-12-15 DIAGNOSIS — H2510 Age-related nuclear cataract, unspecified eye: Secondary | ICD-10-CM | POA: Diagnosis not present

## 2014-01-05 DIAGNOSIS — H2510 Age-related nuclear cataract, unspecified eye: Secondary | ICD-10-CM | POA: Diagnosis not present

## 2014-01-05 DIAGNOSIS — H2589 Other age-related cataract: Secondary | ICD-10-CM | POA: Diagnosis not present

## 2014-01-05 DIAGNOSIS — H25019 Cortical age-related cataract, unspecified eye: Secondary | ICD-10-CM | POA: Diagnosis not present

## 2014-02-17 DIAGNOSIS — M48061 Spinal stenosis, lumbar region without neurogenic claudication: Secondary | ICD-10-CM | POA: Diagnosis not present

## 2014-02-28 DIAGNOSIS — M47817 Spondylosis without myelopathy or radiculopathy, lumbosacral region: Secondary | ICD-10-CM | POA: Diagnosis not present

## 2014-02-28 DIAGNOSIS — M48061 Spinal stenosis, lumbar region without neurogenic claudication: Secondary | ICD-10-CM | POA: Diagnosis not present

## 2014-02-28 DIAGNOSIS — IMO0002 Reserved for concepts with insufficient information to code with codable children: Secondary | ICD-10-CM | POA: Diagnosis not present

## 2014-03-02 DIAGNOSIS — K2100 Gastro-esophageal reflux disease with esophagitis, without bleeding: Secondary | ICD-10-CM | POA: Diagnosis not present

## 2014-03-02 DIAGNOSIS — Z800 Family history of malignant neoplasm of digestive organs: Secondary | ICD-10-CM | POA: Diagnosis not present

## 2014-03-02 DIAGNOSIS — Z86010 Personal history of colonic polyps: Secondary | ICD-10-CM | POA: Diagnosis not present

## 2014-03-24 DIAGNOSIS — M48061 Spinal stenosis, lumbar region without neurogenic claudication: Secondary | ICD-10-CM | POA: Diagnosis not present

## 2014-03-24 DIAGNOSIS — IMO0002 Reserved for concepts with insufficient information to code with codable children: Secondary | ICD-10-CM | POA: Diagnosis not present

## 2014-03-24 DIAGNOSIS — M47817 Spondylosis without myelopathy or radiculopathy, lumbosacral region: Secondary | ICD-10-CM | POA: Diagnosis not present

## 2014-04-08 DIAGNOSIS — Z85828 Personal history of other malignant neoplasm of skin: Secondary | ICD-10-CM | POA: Diagnosis not present

## 2014-04-08 DIAGNOSIS — L821 Other seborrheic keratosis: Secondary | ICD-10-CM | POA: Diagnosis not present

## 2014-04-08 DIAGNOSIS — L820 Inflamed seborrheic keratosis: Secondary | ICD-10-CM | POA: Diagnosis not present

## 2014-04-08 DIAGNOSIS — L570 Actinic keratosis: Secondary | ICD-10-CM | POA: Diagnosis not present

## 2014-05-11 DIAGNOSIS — Z23 Encounter for immunization: Secondary | ICD-10-CM | POA: Diagnosis not present

## 2014-05-23 DIAGNOSIS — M5416 Radiculopathy, lumbar region: Secondary | ICD-10-CM | POA: Diagnosis not present

## 2014-05-23 DIAGNOSIS — M47817 Spondylosis without myelopathy or radiculopathy, lumbosacral region: Secondary | ICD-10-CM | POA: Diagnosis not present

## 2014-05-23 DIAGNOSIS — M4806 Spinal stenosis, lumbar region: Secondary | ICD-10-CM | POA: Diagnosis not present

## 2014-05-23 DIAGNOSIS — M5116 Intervertebral disc disorders with radiculopathy, lumbar region: Secondary | ICD-10-CM | POA: Diagnosis not present

## 2014-05-30 ENCOUNTER — Encounter: Payer: Self-pay | Admitting: Internal Medicine

## 2014-05-30 DIAGNOSIS — L570 Actinic keratosis: Secondary | ICD-10-CM | POA: Diagnosis not present

## 2014-05-30 DIAGNOSIS — L820 Inflamed seborrheic keratosis: Secondary | ICD-10-CM | POA: Diagnosis not present

## 2014-05-30 DIAGNOSIS — Z85828 Personal history of other malignant neoplasm of skin: Secondary | ICD-10-CM | POA: Diagnosis not present

## 2014-05-30 DIAGNOSIS — L814 Other melanin hyperpigmentation: Secondary | ICD-10-CM | POA: Diagnosis not present

## 2014-05-30 DIAGNOSIS — L821 Other seborrheic keratosis: Secondary | ICD-10-CM | POA: Diagnosis not present

## 2014-05-30 DIAGNOSIS — D1801 Hemangioma of skin and subcutaneous tissue: Secondary | ICD-10-CM | POA: Diagnosis not present

## 2014-06-15 DIAGNOSIS — M79673 Pain in unspecified foot: Secondary | ICD-10-CM

## 2014-06-17 ENCOUNTER — Ambulatory Visit: Payer: No Typology Code available for payment source | Admitting: Obstetrics & Gynecology

## 2014-06-30 DIAGNOSIS — E039 Hypothyroidism, unspecified: Secondary | ICD-10-CM | POA: Diagnosis not present

## 2014-06-30 DIAGNOSIS — E785 Hyperlipidemia, unspecified: Secondary | ICD-10-CM | POA: Diagnosis not present

## 2014-06-30 DIAGNOSIS — R7301 Impaired fasting glucose: Secondary | ICD-10-CM | POA: Diagnosis not present

## 2014-06-30 DIAGNOSIS — Z0000 Encounter for general adult medical examination without abnormal findings: Secondary | ICD-10-CM | POA: Diagnosis not present

## 2014-06-30 DIAGNOSIS — I10 Essential (primary) hypertension: Secondary | ICD-10-CM | POA: Diagnosis not present

## 2014-07-06 DIAGNOSIS — N390 Urinary tract infection, site not specified: Secondary | ICD-10-CM | POA: Diagnosis not present

## 2014-07-06 DIAGNOSIS — Z1389 Encounter for screening for other disorder: Secondary | ICD-10-CM | POA: Diagnosis not present

## 2014-07-06 DIAGNOSIS — I10 Essential (primary) hypertension: Secondary | ICD-10-CM | POA: Diagnosis not present

## 2014-07-06 DIAGNOSIS — R7301 Impaired fasting glucose: Secondary | ICD-10-CM | POA: Diagnosis not present

## 2014-07-06 DIAGNOSIS — E785 Hyperlipidemia, unspecified: Secondary | ICD-10-CM | POA: Diagnosis not present

## 2014-07-06 DIAGNOSIS — Z0000 Encounter for general adult medical examination without abnormal findings: Secondary | ICD-10-CM | POA: Diagnosis not present

## 2014-07-06 DIAGNOSIS — R0609 Other forms of dyspnea: Secondary | ICD-10-CM | POA: Diagnosis not present

## 2014-07-06 DIAGNOSIS — Z86010 Personal history of colonic polyps: Secondary | ICD-10-CM | POA: Diagnosis not present

## 2014-07-06 DIAGNOSIS — E039 Hypothyroidism, unspecified: Secondary | ICD-10-CM | POA: Diagnosis not present

## 2014-07-06 DIAGNOSIS — J45998 Other asthma: Secondary | ICD-10-CM | POA: Diagnosis not present

## 2014-07-19 DIAGNOSIS — Z1212 Encounter for screening for malignant neoplasm of rectum: Secondary | ICD-10-CM | POA: Diagnosis not present

## 2014-08-03 ENCOUNTER — Telehealth: Payer: Self-pay | Admitting: Obstetrics & Gynecology

## 2014-08-03 NOTE — Telephone Encounter (Signed)
Spoke with patient. She denies problems at this time. Just wanted to recheck Breast with Dr. Sabra Heck.  Patient is scheduled for 08/22/14 at 1600 and is agreeable to appointment.  Will call back if any problems prior.  Routing to provider for final review. Patient agreeable to disposition. Will close encounter

## 2014-08-03 NOTE — Telephone Encounter (Signed)
Pt has a lump under her right arm that she says Dr. Sabra Heck is checking every 6 months. She wants to schedule an appointment.

## 2014-08-11 DIAGNOSIS — M4806 Spinal stenosis, lumbar region: Secondary | ICD-10-CM | POA: Diagnosis not present

## 2014-08-11 DIAGNOSIS — M5116 Intervertebral disc disorders with radiculopathy, lumbar region: Secondary | ICD-10-CM | POA: Diagnosis not present

## 2014-08-22 ENCOUNTER — Ambulatory Visit (INDEPENDENT_AMBULATORY_CARE_PROVIDER_SITE_OTHER): Payer: Medicare Other | Admitting: Obstetrics & Gynecology

## 2014-08-22 ENCOUNTER — Encounter: Payer: Self-pay | Admitting: Obstetrics & Gynecology

## 2014-08-22 VITALS — BP 122/60 | HR 80 | Resp 16 | Ht 63.0 in | Wt 150.0 lb

## 2014-08-22 DIAGNOSIS — D171 Benign lipomatous neoplasm of skin and subcutaneous tissue of trunk: Secondary | ICD-10-CM

## 2014-08-22 DIAGNOSIS — D1779 Benign lipomatous neoplasm of other sites: Secondary | ICD-10-CM

## 2014-08-22 NOTE — Progress Notes (Signed)
Subjective:     Patient ID: Sabrina Parks, female   DOB: 10-23-1940, 74 y.o.   MRN: [id number]  HPI Very nice 74 yo g2P2 MWF here for breast recheck.  Pt has stopped coming for AEX's due to h/o TAH/BSO and being followed by PCP.  States she feels uncomfortable with PCP doing breast exams so she is here for me to do one today.  Reports the area where the "lesion" was located is tender from time to time but she cannot feel the mass like she could when I first found it.  Hx significant for 1.5cm obvious lesion noted on physical exam 05/24/13.  Prior MMG was 6/14.  MMG and ultrasound were performed.  Then MRI was performed 06/09/13 showing possible intramuscular lipoma.  6 month interval follow up MMG/ultrasound was negative.  I had received all of these and she and I reviewed together.  All questions answered.    Review of Systems  Constitutional: Negative.   Endocrine:       No recent palpable breast masses, nipple discharge, skin changes.  Skin: Negative for color change and rash.       Objective:   Physical Exam  Constitutional: She is oriented to person, place, and time. She appears well-developed and well-nourished.  Neck: Normal range of motion. Neck supple. No tracheal deviation present. No thyromegaly present.  Pulmonary/Chest: Right breast exhibits no inverted nipple, no mass, no nipple discharge, no skin change and no tenderness. Left breast exhibits no inverted nipple, no mass, no nipple discharge, no skin change and no tenderness. Breasts are symmetrical.    Lymphadenopathy:    She has no cervical adenopathy.  Neurological: She is alert and oriented to person, place, and time.  Skin: Skin is warm and dry.  Psychiatric: She has a normal mood and affect.  Vitals reviewed.      Assessment:     H/O prior axillary/breast mass most consistent with intramuscular lipoma but no findings on physical exam today     Plan:     Pt reassured.  She will continue routine breast imaging.   Pt aware I am here if she wants me to continue her breast exams or if she has any new issues.  For now, she will return to her PCP.  ~15 minutes spent with patient >50% of time was in face to face discussion of above.

## 2014-10-10 DIAGNOSIS — Z85828 Personal history of other malignant neoplasm of skin: Secondary | ICD-10-CM | POA: Diagnosis not present

## 2014-10-10 DIAGNOSIS — L570 Actinic keratosis: Secondary | ICD-10-CM | POA: Diagnosis not present

## 2014-10-10 DIAGNOSIS — L919 Hypertrophic disorder of the skin, unspecified: Secondary | ICD-10-CM | POA: Diagnosis not present

## 2014-10-10 DIAGNOSIS — D2272 Melanocytic nevi of left lower limb, including hip: Secondary | ICD-10-CM | POA: Diagnosis not present

## 2014-10-10 DIAGNOSIS — L821 Other seborrheic keratosis: Secondary | ICD-10-CM | POA: Diagnosis not present

## 2014-10-13 ENCOUNTER — Encounter: Payer: Self-pay | Admitting: Internal Medicine

## 2014-10-13 ENCOUNTER — Ambulatory Visit (INDEPENDENT_AMBULATORY_CARE_PROVIDER_SITE_OTHER): Payer: Medicare Other | Admitting: Internal Medicine

## 2014-10-13 VITALS — BP 137/54 | HR 78 | Ht 63.0 in | Wt 151.0 lb

## 2014-10-13 DIAGNOSIS — I10 Essential (primary) hypertension: Secondary | ICD-10-CM

## 2014-10-13 DIAGNOSIS — R002 Palpitations: Secondary | ICD-10-CM

## 2014-10-13 MED ORDER — FERROUS SULFATE DRIED 200 (65 FE) MG PO TABS: 1.0000 | ORAL_TABLET | Freq: Every day | ORAL | Status: DC

## 2014-10-13 NOTE — Patient Instructions (Addendum)
Your physician recommends that you continue on your current medications as directed. Please refer to the Current Medication list given to you today. Your physician wants you to follow-up in: 1 year with Dr. Ross.  You will receive a reminder letter in the mail two months in advance. If you don't receive a letter, please call our office to schedule the follow-up appointment.  

## 2014-10-13 NOTE — Progress Notes (Signed)
Cardiology Office Note   Date:  10/13/2014   ID:  Sabrina Parks, DOB 07-Aug-1940, MRN [id number]  PCP:  Marton Redwood, MD  Cardiologist:   Dorris Carnes, MD   Chief Complaint  Patient presents with  . Appointment      History of Present Illness: Sabrina Parks is a 74 y.o. female  who was previously followed by P Nahser. She is mother to Alfredo Batty She has a history of palpitations, HTN, HL Stress test in 2011 was neg for ischemia. Echo in Nov 2013: Mild LVH LVEF 65 to 18%, Gr I diastolic dysfunction.  Hosp once in November 2013 with ? Slurred speech and memory problems MRA negative. Carotid USN negative. Neuro also evaluated.  I sw her in March 2015    Since seen she has done well  Active  Still doing deep water aerobics  No SOB  No dizziness  No CP Still has plpitations  Mainly in middle of night  Isolated  Problems with back  Spinal stenosis Seeing Dr Ellene Route  No events with memory like in past       Current Outpatient Prescriptions  Medication Sig Dispense Refill  . amitriptyline (ELAVIL) 25 MG tablet 25 mg daily.    Marland Kitchen amLODipine (NORVASC) 10 MG tablet Take 5 mg by mouth daily.    Marland Kitchen aspirin 81 MG tablet Take 81 mg by mouth daily.    Marland Kitchen BIOTIN PO Take by mouth.    . Cholecalciferol (VITAMIN D-3 PO) Take 1 tablet by mouth daily.    . fluticasone (FLONASE) 50 MCG/ACT nasal spray as needed.    Marland Kitchen ibuprofen (ADVIL,MOTRIN) 200 MG tablet Take 200 mg by mouth every 6 (six) hours as needed. For pain    . levothyroxine (SYNTHROID, LEVOTHROID) 50 MCG tablet Take 50 mcg by mouth daily before breakfast.    . LORazepam (ATIVAN) 0.5 MG tablet as needed.    . Multiple Vitamin (MULTIVITAMIN) tablet Take 1 tablet by mouth daily.      Marland Kitchen omeprazole (PRILOSEC) 40 MG capsule Take 40 mg by mouth daily.    Marland Kitchen PROAIR HFA 108 (90 BASE) MCG/ACT inhaler     . rosuvastatin (CRESTOR) 10 MG tablet Take 10 mg by mouth daily.    . valsartan-hydrochlorothiazide (DIOVAN-HCT) 160-25 MG per  tablet Take 1 tablet by mouth daily. 1/2 tablet daily     No current facility-administered medications for this visit.    Allergies:   Codeine; Morphine and related; and Sulfa antibiotics   Past Medical History  Diagnosis Date  . Chest pain   . Hyperlipidemia   . Hypertension   . Arthritis   . HSV-1 infection   . Thyroiditis   . Menometrorrhagia 01/1989    TAH BSO  . Alopecia   . Hypercholesteremia   . DJD (degenerative joint disease), cervical 5/02    [facility]ner  . Liver hemangioma 2011    Dr. Earlean Shawl  . Transient global amnesia     not fully diag    Past Surgical History  Procedure Laterality Date  . Rotator cuff repair Bilateral   . Total abdominal hysterectomy w/ bilateral salpingoophorectomy  7/90  . US echocardiography  10/11/2009    EF 55-60%  . Cardiovascular stress test  10/29/2006    EF 78%.   . Appendectomy  1956  . Tonsillectomy and adenoidectomy  1957     Social History:  The patient  reports that she has quit smoking. She has never used smokeless tobacco. She reports that  she drinks alcohol. She reports that she does not use illicit drugs.   Family History:  The patient's family history includes Cancer in her father; Colon cancer in her father; Stroke in her mother.    ROS:  Please see the history of present illness. All other systems are reviewed and  Negative to the above problem except as noted.    PHYSICAL EXAM: VS:  BP 137/54 mmHg  Pulse 78  Ht 5\' 3"  (1.6 m)  Wt 151 lb (68.493 kg)  BMI 26.76 kg/m2  LMP 07/29/1990  GEN: Well nourished, well developed, in no acute distress HEENT: normal Neck: no JVD, carotid bruits, or masses Cardiac: RRR; no murmurs, rubs, or gallops,no edema  Respiratory:  clear to auscultation bilaterally, normal work of breathing GI: soft, nontender, nondistended, + BS  No hepatomegaly  MS: no deformity Moving all extremities   Skin: warm and dry, no rash Neuro:  Strength and sensation are intact Psych: euthymic mood, full  affect   EKG:  EKG is not ordered today.   Lipid Panel No results found for: CHOL, TRIG, HDL, CHOLHDL, VLDL, LDLCALC, LDLDIRECT    Wt Readings from Last 3 Encounters:  10/13/14 151 lb (68.493 kg)  08/22/14 150 lb (68.04 kg)  10/11/13 144 lb (65.318 kg)      ASSESSMENT AND PLAN: 1.Palpitations   Remain isolatedd skips  Most in middle of night  Will change timing of amlodipine to see if helps    2.  HTN  BP on my check 138   She is going to switch amlodipine to am instead of bedtime    3.  HL  Lipids from [facility]     Current medicines are reviewed at length with the patient today.  The patient does not have concerns regarding medicines.  The following changes have been made:   Switch timing of meds    Labs/ tests ordered today include:  None   No orders of the defined types were placed in this encounter.     Disposition:   FU with me in 1 year    Signed, Dorris Carnes, MD  10/13/2014 8:43 AM    [facility] [address], [place], [place]  [address] Phone: [phone]; Fax: [phone]

## 2014-11-03 ENCOUNTER — Telehealth: Payer: Self-pay | Admitting: Obstetrics & Gynecology

## 2014-11-03 NOTE — Telephone Encounter (Signed)
Spoke with patient. Patient states that she has been experiencing vaginal soreness and found a "lump." First noticed last week. Lump is at the edge of the vagina. Denies any redness or warmth to the area. States the area has remained the same size since she found it. Advised will need to be seen for evaluation in office. Patient is agreeable. Appointment scheduled for tomorrow 4/8 at 1:30pm with Dr.Miller. Patient is agreeable to date and time.   Routing to provider for final review. Patient agreeable to disposition. Will close encounter

## 2014-11-03 NOTE — Telephone Encounter (Signed)
Patient found a "small bump near vagina." Patient would like an appointment to see Dr.Miller. Last seen 08/22/14.

## 2014-11-04 ENCOUNTER — Ambulatory Visit (INDEPENDENT_AMBULATORY_CARE_PROVIDER_SITE_OTHER): Payer: Medicare Other | Admitting: Obstetrics & Gynecology

## 2014-11-04 VITALS — BP 132/60 | HR 60 | Resp 16 | Wt 151.6 lb

## 2014-11-04 DIAGNOSIS — N907 Vulvar cyst: Secondary | ICD-10-CM

## 2014-11-04 DIAGNOSIS — L723 Sebaceous cyst: Secondary | ICD-10-CM | POA: Diagnosis not present

## 2014-11-04 NOTE — Progress Notes (Signed)
Subjective:     Patient ID: Sabrina Parks, female   DOB: [birth year], 74 y.o.   MRN: [id number]  HPI 74 yo G2P2 MWF here with new finding of right labial "mass".  Reports it was tender earlier in the week but not now.  Denies any bleeding or discharge.  A little worried about it.  No bowel or bladder changes.  Review of Systems  All other systems reviewed and are negative.      Objective:   Physical Exam  Constitutional: She appears well-developed and well-nourished.  Genitourinary: Vagina normal.    There is no rash, tenderness, lesion or injury on the right labia. There is no rash, tenderness, lesion or injury on the left labia.  Lymphadenopathy:       Right: No inguinal adenopathy present.       Left: No inguinal adenopathy present.  Neurological: She is alert.  Skin: Skin is warm and dry.  Psychiatric: She has a normal mood and affect.      Assessment:     Right sebaceous cyst     Plan:     Recommend monitoring for now as tenderness is much improved.  D/W pt removal if recurrent or feels like this is enlarging.  She will monitor and call back with any new issues/concerns.

## 2014-11-06 ENCOUNTER — Encounter: Payer: Self-pay | Admitting: Obstetrics & Gynecology

## 2014-11-11 DIAGNOSIS — H43811 Vitreous degeneration, right eye: Secondary | ICD-10-CM | POA: Diagnosis not present

## 2014-11-16 ENCOUNTER — Other Ambulatory Visit: Payer: Self-pay | Admitting: Neurological Surgery

## 2014-11-16 DIAGNOSIS — M545 Low back pain: Secondary | ICD-10-CM | POA: Diagnosis not present

## 2014-11-16 DIAGNOSIS — Z6825 Body mass index (BMI) 25.0-25.9, adult: Secondary | ICD-10-CM | POA: Diagnosis not present

## 2014-11-16 DIAGNOSIS — M5430 Sciatica, unspecified side: Secondary | ICD-10-CM | POA: Diagnosis not present

## 2014-11-20 ENCOUNTER — Ambulatory Visit
Admission: RE | Admit: 2014-11-20 | Discharge: 2014-11-20 | Disposition: A | Discharge status: Home / Self Care | Payer: Medicare Other | Source: Ambulatory Visit | Attending: Neurological Surgery | Admitting: Neurological Surgery

## 2014-11-20 DIAGNOSIS — M5430 Sciatica, unspecified side: Secondary | ICD-10-CM

## 2014-11-20 DIAGNOSIS — M47816 Spondylosis without myelopathy or radiculopathy, lumbar region: Secondary | ICD-10-CM | POA: Diagnosis not present

## 2014-11-20 DIAGNOSIS — M4316 Spondylolisthesis, lumbar region: Secondary | ICD-10-CM | POA: Diagnosis not present

## 2014-11-20 DIAGNOSIS — M5126 Other intervertebral disc displacement, lumbar region: Secondary | ICD-10-CM | POA: Diagnosis not present

## 2014-11-24 DIAGNOSIS — M545 Low back pain: Secondary | ICD-10-CM | POA: Diagnosis not present

## 2014-11-24 DIAGNOSIS — Z6825 Body mass index (BMI) 25.0-25.9, adult: Secondary | ICD-10-CM | POA: Diagnosis not present

## 2014-11-29 DIAGNOSIS — Z1231 Encounter for screening mammogram for malignant neoplasm of breast: Secondary | ICD-10-CM | POA: Diagnosis not present

## 2014-12-02 DIAGNOSIS — M5416 Radiculopathy, lumbar region: Secondary | ICD-10-CM | POA: Diagnosis not present

## 2014-12-02 DIAGNOSIS — M4306 Spondylolysis, lumbar region: Secondary | ICD-10-CM | POA: Diagnosis not present

## 2014-12-02 DIAGNOSIS — M47816 Spondylosis without myelopathy or radiculopathy, lumbar region: Secondary | ICD-10-CM | POA: Diagnosis not present

## 2015-01-23 DIAGNOSIS — Z961 Presence of intraocular lens: Secondary | ICD-10-CM | POA: Diagnosis not present

## 2015-01-23 DIAGNOSIS — H43813 Vitreous degeneration, bilateral: Secondary | ICD-10-CM | POA: Diagnosis not present

## 2015-04-12 DIAGNOSIS — L821 Other seborrheic keratosis: Secondary | ICD-10-CM | POA: Diagnosis not present

## 2015-04-12 DIAGNOSIS — Z85828 Personal history of other malignant neoplasm of skin: Secondary | ICD-10-CM | POA: Diagnosis not present

## 2015-04-12 DIAGNOSIS — L570 Actinic keratosis: Secondary | ICD-10-CM | POA: Diagnosis not present

## 2015-04-12 DIAGNOSIS — L738 Other specified follicular disorders: Secondary | ICD-10-CM | POA: Diagnosis not present

## 2015-04-26 DIAGNOSIS — R300 Dysuria: Secondary | ICD-10-CM | POA: Diagnosis not present

## 2015-04-27 DIAGNOSIS — I10 Essential (primary) hypertension: Secondary | ICD-10-CM | POA: Diagnosis not present

## 2015-04-27 DIAGNOSIS — Z6826 Body mass index (BMI) 26.0-26.9, adult: Secondary | ICD-10-CM | POA: Diagnosis not present

## 2015-04-27 DIAGNOSIS — K5900 Constipation, unspecified: Secondary | ICD-10-CM | POA: Diagnosis not present

## 2015-04-27 DIAGNOSIS — Z23 Encounter for immunization: Secondary | ICD-10-CM | POA: Diagnosis not present

## 2015-06-07 DIAGNOSIS — Z780 Asymptomatic menopausal state: Secondary | ICD-10-CM | POA: Diagnosis not present

## 2015-06-08 DIAGNOSIS — M545 Low back pain: Secondary | ICD-10-CM | POA: Diagnosis not present

## 2015-07-07 DIAGNOSIS — M545 Low back pain: Secondary | ICD-10-CM | POA: Diagnosis not present

## 2015-07-07 DIAGNOSIS — M5416 Radiculopathy, lumbar region: Secondary | ICD-10-CM | POA: Diagnosis not present

## 2015-07-07 DIAGNOSIS — M4726 Other spondylosis with radiculopathy, lumbar region: Secondary | ICD-10-CM | POA: Diagnosis not present

## 2015-08-01 ENCOUNTER — Ambulatory Visit (INDEPENDENT_AMBULATORY_CARE_PROVIDER_SITE_OTHER): Payer: Medicare Other | Admitting: Obstetrics & Gynecology

## 2015-08-01 ENCOUNTER — Encounter: Payer: Self-pay | Admitting: Obstetrics & Gynecology

## 2015-08-01 VITALS — BP 120/60 | HR 80 | Temp 98.1°F | Ht 63.0 in | Wt 148.0 lb

## 2015-08-01 DIAGNOSIS — N644 Mastodynia: Secondary | ICD-10-CM

## 2015-08-01 NOTE — Progress Notes (Signed)
Subjective:     Patient ID: Sabrina Parks, female   DOB: [birth year], 75 y.o.   MRN: [id number]  HPI 75 yo G2P2 MWF here with complaint of right breast pain that seems to have worsened over the last several days.  She states she can't wait until it is evening and she can take her clothes off because this is when her breast stops hurting.  Pt has h/o lymph node that was seen on imaging in 2014 and followed.  Was 70mm in size.  Had negative MMG, that was 3D, 5/16.  reveiwed with pt.  Denies trauma or nipple discharge.  She cannot feel a lump.  Reports she did get new bras this fall and feels they are too tight.   Review of Systems  All other systems reviewed and are negative.      Objective:   Physical Exam  Constitutional: She is oriented to person, place, and time. She appears well-developed and well-nourished.  Neck: Normal range of motion. Neck supple. No thyromegaly present.  Pulmonary/Chest: She exhibits no mass. Right breast exhibits tenderness. Right breast exhibits no inverted nipple, no mass, no nipple discharge and no skin change. Left breast exhibits no inverted nipple, no mass, no nipple discharge, no skin change and no tenderness. Breasts are symmetrical.    Seams from bra can be seen on pt's skin after she was undressed for several minutes before her examination.  No focal findings on examination.  Lymphadenopathy:    She has no cervical adenopathy.  Neurological: She is alert and oriented to person, place, and time.  Skin: Skin is warm and dry.  Psychiatric: She has a normal mood and affect.       Assessment:     Right breast tenderness     Plan:     I really think this is related to her new bras.  Bra fitting discussed and locations that do this were reviewed.  Pt is going to wear a sports bra for two weeks and give me an update.  With a negative exam today and negative 3D MMG 5/16, do not feel additional imaging is warranted today.  Pt comfortable with plan.  She will  call and give update in two weeks.  Pt can take anti-inflammatories as well.

## 2015-08-03 DIAGNOSIS — I10 Essential (primary) hypertension: Secondary | ICD-10-CM | POA: Diagnosis not present

## 2015-08-03 DIAGNOSIS — R7301 Impaired fasting glucose: Secondary | ICD-10-CM | POA: Diagnosis not present

## 2015-08-03 DIAGNOSIS — E038 Other specified hypothyroidism: Secondary | ICD-10-CM | POA: Diagnosis not present

## 2015-08-03 DIAGNOSIS — E784 Other hyperlipidemia: Secondary | ICD-10-CM | POA: Diagnosis not present

## 2015-08-08 DIAGNOSIS — R110 Nausea: Secondary | ICD-10-CM | POA: Diagnosis not present

## 2015-08-08 DIAGNOSIS — Z0000 Encounter for general adult medical examination without abnormal findings: Secondary | ICD-10-CM | POA: Diagnosis not present

## 2015-08-08 DIAGNOSIS — Z6825 Body mass index (BMI) 25.0-25.9, adult: Secondary | ICD-10-CM | POA: Diagnosis not present

## 2015-08-08 DIAGNOSIS — E784 Other hyperlipidemia: Secondary | ICD-10-CM | POA: Diagnosis not present

## 2015-08-08 DIAGNOSIS — Z1389 Encounter for screening for other disorder: Secondary | ICD-10-CM | POA: Diagnosis not present

## 2015-08-08 DIAGNOSIS — E038 Other specified hypothyroidism: Secondary | ICD-10-CM | POA: Diagnosis not present

## 2015-08-08 DIAGNOSIS — Z8679 Personal history of other diseases of the circulatory system: Secondary | ICD-10-CM | POA: Diagnosis not present

## 2015-08-08 DIAGNOSIS — R7301 Impaired fasting glucose: Secondary | ICD-10-CM | POA: Diagnosis not present

## 2015-08-08 DIAGNOSIS — R740 Nonspecific elevation of levels of transaminase and lactic acid dehydrogenase [LDH]: Secondary | ICD-10-CM | POA: Diagnosis not present

## 2015-08-08 DIAGNOSIS — J45998 Other asthma: Secondary | ICD-10-CM | POA: Diagnosis not present

## 2015-08-08 DIAGNOSIS — I10 Essential (primary) hypertension: Secondary | ICD-10-CM | POA: Diagnosis not present

## 2015-08-10 ENCOUNTER — Other Ambulatory Visit: Payer: Self-pay | Admitting: Internal Medicine

## 2015-08-10 DIAGNOSIS — R110 Nausea: Secondary | ICD-10-CM

## 2015-08-10 DIAGNOSIS — R7401 Elevation of levels of liver transaminase levels: Secondary | ICD-10-CM

## 2015-08-10 DIAGNOSIS — R740 Nonspecific elevation of levels of transaminase and lactic acid dehydrogenase [LDH]: Secondary | ICD-10-CM

## 2015-08-17 ENCOUNTER — Ambulatory Visit
Admission: RE | Admit: 2015-08-17 | Discharge: 2015-08-17 | Disposition: A | Discharge status: Home / Self Care | Payer: Medicare Other | Source: Ambulatory Visit | Attending: Internal Medicine | Admitting: Internal Medicine

## 2015-08-17 DIAGNOSIS — R110 Nausea: Secondary | ICD-10-CM

## 2015-08-17 DIAGNOSIS — R740 Nonspecific elevation of levels of transaminase and lactic acid dehydrogenase [LDH]: Secondary | ICD-10-CM

## 2015-08-17 DIAGNOSIS — K8020 Calculus of gallbladder without cholecystitis without obstruction: Secondary | ICD-10-CM | POA: Diagnosis not present

## 2015-08-17 DIAGNOSIS — R7401 Elevation of levels of liver transaminase levels: Secondary | ICD-10-CM

## 2015-08-18 DIAGNOSIS — Z1212 Encounter for screening for malignant neoplasm of rectum: Secondary | ICD-10-CM | POA: Diagnosis not present

## 2015-08-24 DIAGNOSIS — R932 Abnormal findings on diagnostic imaging of liver and biliary tract: Secondary | ICD-10-CM | POA: Diagnosis not present

## 2015-08-24 DIAGNOSIS — Z86010 Personal history of colonic polyps: Secondary | ICD-10-CM | POA: Diagnosis not present

## 2015-08-24 DIAGNOSIS — R110 Nausea: Secondary | ICD-10-CM | POA: Diagnosis not present

## 2015-09-09 DIAGNOSIS — J208 Acute bronchitis due to other specified organisms: Secondary | ICD-10-CM | POA: Diagnosis not present

## 2015-09-19 DIAGNOSIS — I10 Essential (primary) hypertension: Secondary | ICD-10-CM | POA: Diagnosis not present

## 2015-09-19 DIAGNOSIS — J309 Allergic rhinitis, unspecified: Secondary | ICD-10-CM | POA: Diagnosis not present

## 2015-09-19 DIAGNOSIS — J45998 Other asthma: Secondary | ICD-10-CM | POA: Diagnosis not present

## 2015-09-19 DIAGNOSIS — Z6825 Body mass index (BMI) 25.0-25.9, adult: Secondary | ICD-10-CM | POA: Diagnosis not present

## 2015-10-23 ENCOUNTER — Ambulatory Visit (INDEPENDENT_AMBULATORY_CARE_PROVIDER_SITE_OTHER): Payer: Medicare Other | Admitting: Internal Medicine

## 2015-10-23 ENCOUNTER — Encounter: Payer: Self-pay | Admitting: Internal Medicine

## 2015-10-23 VITALS — BP 138/54 | HR 90 | Ht 63.0 in | Wt 149.8 lb

## 2015-10-23 DIAGNOSIS — I10 Essential (primary) hypertension: Secondary | ICD-10-CM

## 2015-10-23 NOTE — Progress Notes (Signed)
Cardiology Office Note   Date:  10/23/2015   ID:  Sabrina Parks, DOB [birth year], MRN [id number]  PCP:  Marton Redwood, MD  Cardiologist:   Dorris Carnes, MD   F/U of HTN and palpitaitions      History of Present Illness: [name] is a 75 y.o. female with a history of palpitations, HTN, HL Stress test in 2011 was neg for ischemia. Echo in Nov 2013: Mild LVH LVEF 65 to [id number], Gr I diastolic dysfunction.  Hosp once in November 2013 with ? Slurred speech and memory problems MRA negative. Carotid USN negative. Neuro also evaluated.  I sw her in March 2016    Since then she has done well  \ Occasional palpitations   Isolated skips  Notices more in am  Occasional SOB if goes up hills   Not SOB with water aerobics  Still does this  3x per week Denies CP     Outpatient Prescriptions Prior to Visit  Medication Sig Dispense Refill  . amitriptyline (ELAVIL) 25 MG tablet 25 mg daily.    Marland Kitchen amLODipine (NORVASC) 10 MG tablet Take 5 mg by mouth daily.    Marland Kitchen aspirin 81 MG tablet Take 81 mg by mouth daily.    Marland Kitchen BIOTIN PO Take by mouth.    . Cholecalciferol (VITAMIN D-3 PO) Take 1 tablet by mouth daily.    . fluticasone (FLONASE) 50 MCG/ACT nasal spray as needed.    Marland Kitchen ibuprofen (ADVIL,MOTRIN) 200 MG tablet Take 200 mg by mouth every 6 (six) hours as needed. For pain    . levothyroxine (SYNTHROID, LEVOTHROID) 50 MCG tablet Take 50 mcg by mouth daily before breakfast.    . LINZESS 145 MCG CAPS capsule TK ONE C PO  QD  6  . LORazepam (ATIVAN) 0.5 MG tablet as needed.    . Multiple Vitamin (MULTIVITAMIN) tablet Take 1 tablet by mouth daily.      Marland Kitchen PROAIR HFA 108 (90 BASE) MCG/ACT inhaler     . promethazine (PHENERGAN) 25 MG tablet TK 1 T PO TID PRF NAUSEA  0  . rosuvastatin (CRESTOR) 10 MG tablet Take 10 mg by mouth daily.    . valsartan-hydrochlorothiazide (DIOVAN-HCT) 160-25 MG per tablet Take 1 tablet by mouth daily. 1/2 tablet daily    . omeprazole (PRILOSEC) 40 MG capsule Take 40  mg by mouth daily. Reported on 10/23/2015     No facility-administered medications prior to visit.     Allergies:   Codeine; Morphine; Morphine and related; and Sulfa antibiotics   Past Medical History  Diagnosis Date  . Chest pain   . Hyperlipidemia   . Hypertension   . Arthritis   . HSV-1 infection   . Thyroiditis   . Menometrorrhagia 01/1989    TAH BSO  . Alopecia   . Hypercholesteremia   . DJD (degenerative joint disease), cervical 5/02    Elsner  . Liver hemangioma 2011    Dr. Earlean Shawl  . Transient global amnesia     not fully diag    Past Surgical History  Procedure Laterality Date  . Rotator cuff repair Bilateral   . Total abdominal hysterectomy w/ bilateral salpingoophorectomy  7/90  . US echocardiography  10/11/2009    EF 55-60%  . Cardiovascular stress test  10/29/2006    EF 78%.   . Appendectomy  1956  . Tonsillectomy and adenoidectomy  1957     Social History:  The patient  reports that she has quit smoking. She has  never used smokeless tobacco. She reports that she drinks alcohol. She reports that she does not use illicit drugs.   Family History:  The patient's family history includes Cancer in her father; Colon cancer in her father; Stroke in her mother.    ROS:  Please see the history of present illness. All other systems are reviewed and  Negative to the above problem except as noted.    PHYSICAL EXAM: VS:  BP 138/54 mmHg  Pulse 90  Ht 5\' 3"  (1.6 m)  Wt 149 lb 12.8 oz (67.949 kg)  BMI 26.54 kg/m2  SpO2 99%  LMP 07/29/1990  GEN: Well nourished, well developed, in no acute distress HEENT: normal Neck: no JVD, carotid bruits, or masses Cardiac: RRR; no murmurs, rubs, or gallops,no edema  Respiratory:  clear to auscultation bilaterally, normal work of breathing GI: soft, nontender, nondistended, + BS  No hepatomegaly  MS: no deformity Moving all extremities   Skin: warm and dry, no rash Neuro:  Strength and sensation are intact Psych: euthymic  mood, full affect   EKG:  EKG is ordered today.  SR 80  Sl sagging of ST segments  Anteroseptal MI  Old   Lipid Panel No results found for: CHOL, TRIG, HDL, CHOLHDL, VLDL, LDLCALC, LDLDIRECT    Wt Readings from Last 3 Encounters:  10/23/15 149 lb 12.8 oz (67.949 kg)  08/01/15 148 lb (67.132 kg)  11/04/14 151 lb 9.6 oz (68.765 kg)      ASSESSMENT AND PLAN:  1  Palpitations    Sound like isolated PACs  Follow  Check pulse if extended and call  2  HL  Get lipid from [facility]  She says they were not as good on her  last check   3  HTN  BP oK  She says it is usually better  Follow  Keep on same meds      Signed, Dorris Carnes, MD  10/23/2015 12:10 PM    [facility] [address], [place], [place]  [address] Phone: [phone]; Fax: [phone]

## 2015-11-13 DIAGNOSIS — M4806 Spinal stenosis, lumbar region: Secondary | ICD-10-CM | POA: Diagnosis not present

## 2015-11-13 DIAGNOSIS — M76891 Other specified enthesopathies of right lower limb, excluding foot: Secondary | ICD-10-CM | POA: Diagnosis not present

## 2015-11-21 DIAGNOSIS — E784 Other hyperlipidemia: Secondary | ICD-10-CM | POA: Diagnosis not present

## 2015-12-08 DIAGNOSIS — L820 Inflamed seborrheic keratosis: Secondary | ICD-10-CM | POA: Diagnosis not present

## 2015-12-08 DIAGNOSIS — D692 Other nonthrombocytopenic purpura: Secondary | ICD-10-CM | POA: Diagnosis not present

## 2015-12-08 DIAGNOSIS — D485 Neoplasm of uncertain behavior of skin: Secondary | ICD-10-CM | POA: Diagnosis not present

## 2015-12-08 DIAGNOSIS — L821 Other seborrheic keratosis: Secondary | ICD-10-CM | POA: Diagnosis not present

## 2015-12-08 DIAGNOSIS — Z85828 Personal history of other malignant neoplasm of skin: Secondary | ICD-10-CM | POA: Diagnosis not present

## 2015-12-08 DIAGNOSIS — L570 Actinic keratosis: Secondary | ICD-10-CM | POA: Diagnosis not present

## 2015-12-12 DIAGNOSIS — Z1231 Encounter for screening mammogram for malignant neoplasm of breast: Secondary | ICD-10-CM | POA: Diagnosis not present

## 2016-01-22 DIAGNOSIS — L814 Other melanin hyperpigmentation: Secondary | ICD-10-CM | POA: Diagnosis not present

## 2016-01-22 DIAGNOSIS — D485 Neoplasm of uncertain behavior of skin: Secondary | ICD-10-CM | POA: Diagnosis not present

## 2016-01-22 DIAGNOSIS — L905 Scar conditions and fibrosis of skin: Secondary | ICD-10-CM | POA: Diagnosis not present

## 2016-01-22 DIAGNOSIS — Z85828 Personal history of other malignant neoplasm of skin: Secondary | ICD-10-CM | POA: Diagnosis not present

## 2016-02-02 DIAGNOSIS — Z961 Presence of intraocular lens: Secondary | ICD-10-CM | POA: Diagnosis not present

## 2016-02-02 DIAGNOSIS — H01004 Unspecified blepharitis left upper eyelid: Secondary | ICD-10-CM | POA: Diagnosis not present

## 2016-02-02 DIAGNOSIS — H01001 Unspecified blepharitis right upper eyelid: Secondary | ICD-10-CM | POA: Diagnosis not present

## 2016-02-02 DIAGNOSIS — H52203 Unspecified astigmatism, bilateral: Secondary | ICD-10-CM | POA: Diagnosis not present

## 2016-04-11 DIAGNOSIS — M545 Low back pain: Secondary | ICD-10-CM | POA: Diagnosis not present

## 2016-04-18 ENCOUNTER — Ambulatory Visit (INDEPENDENT_AMBULATORY_CARE_PROVIDER_SITE_OTHER): Payer: Medicare Other | Admitting: Obstetrics & Gynecology

## 2016-04-18 ENCOUNTER — Encounter: Payer: Self-pay | Admitting: Obstetrics & Gynecology

## 2016-04-18 VITALS — BP 116/60 | HR 80 | Resp 16 | Ht 63.0 in | Wt 152.0 lb

## 2016-04-18 DIAGNOSIS — K6289 Other specified diseases of anus and rectum: Secondary | ICD-10-CM | POA: Diagnosis not present

## 2016-04-18 MED ORDER — TRIAMCINOLONE ACETONIDE 0.5 % EX OINT: 1.0000 "application " | TOPICAL_OINTMENT | Freq: Two times a day (BID) | CUTANEOUS | 1 refills | Status: DC

## 2016-04-18 NOTE — Patient Instructions (Signed)
Lichen Sclerosus Lichen sclerosus is a skin problem. It can happen on any part of the body, but it commonly involves the anal or genital areas. It can cause itching and discomfort in these areas. Treatment can help to control symptoms. When the genital area is affected, getting treatment is important because the condition can cause scarring that may lead to other problems. CAUSES The cause of this condition is not known. It could be the result of an overactive immune system or a lack of certain hormones. Lichen sclerosus is not an infection or a fungus. It is not passed from one person to another (not contagious). RISK FACTORS This condition is more likely to develop in women, usually after menopause. SYMPTOMS Symptoms of this condition include:  Thin, wrinkled, white areas on the skin.  Thickened white areas on the skin.  Red and swollen patches (lesions) on the skin.  Tears or cracks in the skin.  Bruising.  Blood blisters.  Severe itching. You may also have pain, itching, or burning with urination. Constipation is also common in people with lichen sclerosus. DIAGNOSIS This condition may be diagnosed with a physical exam. In some cases, a tissue sample (biopsy sample) may be removed to be looked at under a microscope. TREATMENT This condition is usually treated with medicated creams or ointments (topical steroids) that are applied over the affected areas. HOME CARE INSTRUCTIONS  Take over-the-counter and prescription medicines only as told by your health care provider.  Use creams or ointments as told by your health care provider.  Do not scratch the affected areas of skin.  Women should keep the vaginal area as clean and dry as possible.  Keep all follow-up visits as told by your health care provider. This is important. SEEK MEDICAL CARE IF:  You have increasing redness, swelling, or pain in the affected area.  You have fluid, blood, or pus coming from the affected  area.  You have new lesions on your skin.  You have pain or burning with urination.  You have pain during sex.   This information is not intended to replace advice given to you by your health care provider. Make sure you discuss any questions you have with your health care provider.   Document Released: 12/05/2010 Document Revised: 04/05/2015 Document Reviewed: 10/10/2014 Elsevier Interactive Patient Education 2016 [employer]  

## 2016-04-18 NOTE — Progress Notes (Signed)
GYNECOLOGY  VISIT   HPI: 75 y.o. G37P2002 Married Caucasian female with several months of recurrent perirectal itching and irritation.  Pt states this will bother her for a few days to the point where she will be almost ready to call but then improves.  She can't figure out any particular triggers or what makes it better.  Denies vaginal or rectal bleeding.  Hasn't really  Has not noted any bleeding or discharge.  GYNECOLOGIC HISTORY: Patient's last menstrual period was 07/29/1990.  Patient Active Problem List   Diagnosis Date Noted  . TIA (transient ischemic attack) 06/12/2012  . HTN (hypertension) 05/20/2011  . Chest pain 05/20/2011    Past Medical History:  Diagnosis Date  . Alopecia   . Arthritis   . Chest pain   . DJD (degenerative joint disease), cervical 5/02   Elsner  . HSV-1 infection   . Hypercholesteremia   . Hyperlipidemia   . Hypertension   . Liver hemangioma 2011   Dr. Earlean Shawl  . Menometrorrhagia 01/1989   TAH BSO  . Thyroiditis   . Transient global amnesia    not fully diag    Past Surgical History:  Procedure Laterality Date  . APPENDECTOMY  1956  . CARDIOVASCULAR STRESS TEST  10/29/2006   EF 78%.   Marland Kitchen ROTATOR CUFF REPAIR Bilateral   . TONSILLECTOMY AND ADENOIDECTOMY  1957  . TOTAL ABDOMINAL HYSTERECTOMY W/ BILATERAL SALPINGOOPHORECTOMY  7/90  . US ECHOCARDIOGRAPHY  10/11/2009   EF 55-60%    MEDS:  Reviewed in EPIC and UTD  ALLERGIES: Codeine; Morphine; Morphine and related; and Sulfa antibiotics  Family History  Problem Relation Age of Onset  . Stroke Mother   . Cancer Father   . Colon cancer Father     12's    SH:  Married, non smoker  Review of Systems  Skin: Positive for itching.  All other systems reviewed and are negative.   PHYSICAL EXAMINATION:    BP 116/60   Pulse 80   Resp 16   Ht 5\' 3"  (1.6 m)   Wt 152 lb (68.9 kg)   LMP 07/29/1990   BMI 26.93 kg/m     Physical Exam  Constitutional: She appears well-developed and  well-nourished.  Genitourinary:     Skin: Skin is warm and dry.  Psychiatric: She has a normal mood and affect.   Chaperone was present for exam.  Assessment: Perirectal irritation Possible LS&A Significant excoriation  Plan: Due to significant skin excoriation, she is advised to start Kenalog 0.5% ointment bid for four weeks and follow up.  Will likely do a biopsy at that time but I'd like to see skin heal before deciding where location for biopsy would be best.

## 2016-04-21 DIAGNOSIS — Z23 Encounter for immunization: Secondary | ICD-10-CM | POA: Diagnosis not present

## 2016-04-22 DIAGNOSIS — M5416 Radiculopathy, lumbar region: Secondary | ICD-10-CM | POA: Diagnosis not present

## 2016-05-16 ENCOUNTER — Ambulatory Visit (INDEPENDENT_AMBULATORY_CARE_PROVIDER_SITE_OTHER): Payer: Medicare Other | Admitting: Obstetrics & Gynecology

## 2016-05-16 ENCOUNTER — Encounter: Payer: Self-pay | Admitting: Obstetrics & Gynecology

## 2016-05-16 VITALS — BP 130/54 | HR 84 | Resp 14 | Ht 63.0 in | Wt 149.6 lb

## 2016-05-16 DIAGNOSIS — K6289 Other specified diseases of anus and rectum: Secondary | ICD-10-CM | POA: Diagnosis not present

## 2016-05-16 DIAGNOSIS — L292 Pruritus vulvae: Secondary | ICD-10-CM | POA: Diagnosis not present

## 2016-05-16 DIAGNOSIS — N904 Leukoplakia of vulva: Secondary | ICD-10-CM | POA: Diagnosis not present

## 2016-05-16 MED ORDER — CLOBETASOL PROPIONATE 0.05 % EX OINT: 1.0000 "application " | TOPICAL_OINTMENT | Freq: Two times a day (BID) | CUTANEOUS | 1 refills | Status: DC

## 2016-05-16 NOTE — Progress Notes (Signed)
GYNECOLOGY  VISIT   HPI: 75 y.o. G57P2002 Married Caucasian female here for recheck of vulva since starting Triamcinolone ointment for vulvar irritation/burning.  Exam on 04/18/16 c/w LSA.  Pt reports symptoms are much better but she is not completely "back to normal".  We discussed doing a vulvar biopsy at the last visit if symptoms were not significantly improved.  Last visit, skin was very excoriated and this was extensive so hard to know where to target a biopsy.  GYNECOLOGIC HISTORY: Patient's last menstrual period was 07/29/1990.  Patient Active Problem List   Diagnosis Date Noted  . TIA (transient ischemic attack) 06/12/2012  . HTN (hypertension) 05/20/2011  . Chest pain 05/20/2011    Past Medical History:  Diagnosis Date  . Alopecia   . Arthritis   . Chest pain   . DJD (degenerative joint disease), cervical 5/02   [facility]lsner  . HSV-1 infection   . Hypercholesteremia   . Hyperlipidemia   . Hypertension   . Liver hemangioma 2011   Dr. Earlean Shawl  . Menometrorrhagia 01/1989   TAH BSO  . Thyroiditis   . Transient global amnesia    not fully diag    Past Surgical History:  Procedure Laterality Date  . APPENDECTOMY  1956  . CARDIOVASCULAR STRESS TEST  10/29/2006   EF 78%.   Marland Kitchen ROTATOR CUFF REPAIR Bilateral   . TONSILLECTOMY AND ADENOIDECTOMY  1957  . TOTAL ABDOMINAL HYSTERECTOMY W/ BILATERAL SALPINGOOPHORECTOMY  7/90  . US ECHOCARDIOGRAPHY  10/11/2009   EF 55-60%    MEDS:  Reviewed in EPIC and UTD  ALLERGIES: Codeine; Morphine; Morphine and related; and Sulfa antibiotics  Family History  Problem Relation Age of Onset  . Stroke Mother   . Cancer Father   . Colon cancer Father     48's    SH:  Married, non smoker  Review of Systems  All other systems reviewed and are negative.   PHYSICAL EXAMINATION:    BP (!) 130/54 (BP Location: Right Arm, Patient Position: Sitting, Cuff Size: Normal)   Pulse 84   Resp 14   Ht 5\' 3"  (1.6 m)   Wt 149 lb 9.6 oz (67.9 kg)   LMP  07/29/1990   BMI 26.50 kg/m     Physical Exam  Constitutional: She is oriented to person, place, and time. She appears well-developed and well-nourished.  Genitourinary:     Neurological: She is alert and oriented to person, place, and time.  Skin: Skin is warm and dry.  Psychiatric: She has a normal mood and affect.   Procedure:  Area cleansed with Betadine x 3 after identifying location for biopsy.  This is same area as pt has most significant symptoms.  Area instilled with 0.5cc 1% Lidocaine, Lot:  [id number].  Exp: 09/18/16.  Using sterile pickups and scissors, biopsy obtained.  Silver nitrate used for excellent hemostasis.  Pt tolerated procedure well.  Chaperone was present for exam.  Assessment: Vulvar irritation Abnormal appearing vulvar skin  Plan: Biopsy pending.  Pt does not like the way the triamcinolone "spreads" so rx for Clobetasol 0.05% ointment given to continue with twice daily.  Results will be called to the pt and she will be given additional directions then.

## 2016-05-22 ENCOUNTER — Telehealth: Payer: Self-pay

## 2016-05-22 ENCOUNTER — Encounter: Payer: Self-pay | Admitting: Obstetrics & Gynecology

## 2016-05-22 DIAGNOSIS — L900 Lichen sclerosus et atrophicus: Secondary | ICD-10-CM

## 2016-05-22 HISTORY — DX: Lichen sclerosus et atrophicus: L90.0

## 2016-05-22 MED ORDER — CLOBETASOL PROPIONATE 0.05 % EX OINT: 1.0000 "application " | TOPICAL_OINTMENT | Freq: Two times a day (BID) | CUTANEOUS | 1 refills | Status: DC

## 2016-05-22 NOTE — Telephone Encounter (Signed)
Spoke with patient. Advised of results and message from [place] as seen below. Patient is agreeable and verbalizes understanding. 1 month recheck scheduled for 06/13/2016 at 10 am with Dr.Miller. Patient is agreeable to date and time. Requesting rx be sent to [facility] off [address] and [address]. Clobetasol ointment 0.05 % apply BID # 60 g 0RF sent to preferred pharmacy. Patient is agreeable.  Routing to provider for final review. Patient agreeable to disposition. Will close encounter.

## 2016-05-22 NOTE — Telephone Encounter (Signed)
-----   Message from Megan Salon, MD sent at 05/22/2016  8:14 AM EDT ----- Please let pt know biopsy showed lichen sclerosus--what I thought it was.  No abnormal cells noted.  She should use the clobetasol ointment BID x 1 month and then return for recheck.

## 2016-06-13 ENCOUNTER — Ambulatory Visit (INDEPENDENT_AMBULATORY_CARE_PROVIDER_SITE_OTHER): Payer: Medicare Other | Admitting: Obstetrics & Gynecology

## 2016-06-13 ENCOUNTER — Encounter: Payer: Self-pay | Admitting: Obstetrics & Gynecology

## 2016-06-13 VITALS — BP 102/60 | HR 88 | Resp 16 | Ht 63.75 in | Wt 147.0 lb

## 2016-06-13 DIAGNOSIS — E039 Hypothyroidism, unspecified: Secondary | ICD-10-CM

## 2016-06-13 DIAGNOSIS — L900 Lichen sclerosus et atrophicus: Secondary | ICD-10-CM

## 2016-06-13 NOTE — Progress Notes (Signed)
GYNECOLOGY  VISIT   HPI: 75 y.o. G80P2002 Married Caucasian female here for follow up after being diagnosed via vulva biopsy with LS&A.  Pt has been using topical steroid and symptoms are gone.  She did not use any today.  Was a little confused about the directions between the steroid ointments.  Clarification given today--once diagnosis of LS&A made, felt pt would benefit faster from clobetasol.  Wondering if she can stop the medication.  Denies vaginal bleeding or discharge.  Denies pain.  GYNECOLOGIC HISTORY: Patient's last menstrual period was 07/29/1990.  Patient Active Problem List   Diagnosis Date Noted  . TIA (transient ischemic attack) 06/12/2012  . HTN (hypertension) 05/20/2011  . Chest pain 05/20/2011   Past Medical History:  Diagnosis Date  . Alopecia   . Arthritis   . Chest pain   . DJD (degenerative joint disease), cervical 5/02   Elsner  . HSV-1 infection   . Hypercholesteremia   . Hyperlipidemia   . Hypertension   . Lichen sclerosus 0000000   biopsy proven  . Liver hemangioma 2011   Dr. Earlean Shawl  . Menometrorrhagia 01/1989   TAH BSO  . Thyroiditis   . Transient global amnesia    not fully diag    Past Surgical History:  Procedure Laterality Date  . APPENDECTOMY  1956  . CARDIOVASCULAR STRESS TEST  10/29/2006   EF 78%.   Marland Kitchen ROTATOR CUFF REPAIR Bilateral   . TONSILLECTOMY AND ADENOIDECTOMY  1957  . TOTAL ABDOMINAL HYSTERECTOMY W/ BILATERAL SALPINGOOPHORECTOMY  7/90  . US ECHOCARDIOGRAPHY  10/11/2009   EF 55-60%    MEDS:  Reviewed in EPIC and UTD  ALLERGIES: Codeine; Morphine; Morphine and related; and Sulfa antibiotics  Family History  Problem Relation Age of Onset  . Stroke Mother   . Cancer Father   . Colon cancer Father     58's   SH:  Married, non smoker  Review of Systems  All other systems reviewed and are negative.   PHYSICAL EXAMINATION:   Physical Exam  Constitutional: She is oriented to person, place, and time. She appears  well-developed and well-nourished.  Genitourinary: Vagina normal.    There is lesion on the right labia. There is no rash, tenderness or injury on the right labia. There is lesion on the left labia. There is no rash, tenderness or injury on the left labia. No bleeding in the vagina.  Lymphadenopathy:       Right: No inguinal adenopathy present.       Left: No inguinal adenopathy present.  Neurological: She is alert and oriented to person, place, and time.  Skin: Skin is warm and dry.  Psychiatric: She has a normal mood and affect.  Vitals reviewed.   Chaperone was present for exam.  Assessment: Biopsy proven LS&A, significant improvement  Plan: Pt will decrease steroid use to nightly and then after four more weeks, decrease to twice weekly.  We discussed mild increased risks of vulvar cancer and need to return for OV if having any persistent symptoms or if she has any areas that won't heal.  Biopsy would be considered again at that point.  Pt voiced understanding. Plan f/u at [facility].

## 2016-06-13 NOTE — Patient Instructions (Signed)
Decrease steroid ointment to nightly use for one month.  Then decrease to twice weekly for another month (nightly).  Then you can stop.  If you have a flare, use twice daily for up to a week.

## 2016-06-14 ENCOUNTER — Encounter: Payer: Self-pay | Admitting: Obstetrics & Gynecology

## 2016-06-14 DIAGNOSIS — E785 Hyperlipidemia, unspecified: Secondary | ICD-10-CM | POA: Insufficient documentation

## 2016-06-14 DIAGNOSIS — L900 Lichen sclerosus et atrophicus: Secondary | ICD-10-CM | POA: Insufficient documentation

## 2016-06-14 DIAGNOSIS — E039 Hypothyroidism, unspecified: Secondary | ICD-10-CM | POA: Insufficient documentation

## 2016-06-14 DIAGNOSIS — I10 Essential (primary) hypertension: Secondary | ICD-10-CM | POA: Insufficient documentation

## 2016-06-18 DIAGNOSIS — Z85828 Personal history of other malignant neoplasm of skin: Secondary | ICD-10-CM | POA: Diagnosis not present

## 2016-06-18 DIAGNOSIS — L905 Scar conditions and fibrosis of skin: Secondary | ICD-10-CM | POA: Diagnosis not present

## 2016-06-18 DIAGNOSIS — L570 Actinic keratosis: Secondary | ICD-10-CM | POA: Diagnosis not present

## 2016-07-23 ENCOUNTER — Encounter: Payer: Self-pay | Admitting: *Deleted

## 2016-08-08 DIAGNOSIS — R7301 Impaired fasting glucose: Secondary | ICD-10-CM | POA: Diagnosis not present

## 2016-08-08 DIAGNOSIS — E784 Other hyperlipidemia: Secondary | ICD-10-CM | POA: Diagnosis not present

## 2016-08-08 DIAGNOSIS — R8299 Other abnormal findings in urine: Secondary | ICD-10-CM | POA: Diagnosis not present

## 2016-08-08 DIAGNOSIS — I10 Essential (primary) hypertension: Secondary | ICD-10-CM | POA: Diagnosis not present

## 2016-08-08 DIAGNOSIS — E038 Other specified hypothyroidism: Secondary | ICD-10-CM | POA: Diagnosis not present

## 2016-08-09 ENCOUNTER — Ambulatory Visit: Payer: Medicare Other | Admitting: Internal Medicine

## 2016-08-27 DIAGNOSIS — Z0000 Encounter for general adult medical examination without abnormal findings: Secondary | ICD-10-CM | POA: Diagnosis not present

## 2016-08-27 DIAGNOSIS — J45998 Other asthma: Secondary | ICD-10-CM | POA: Diagnosis not present

## 2016-08-27 DIAGNOSIS — I10 Essential (primary) hypertension: Secondary | ICD-10-CM | POA: Diagnosis not present

## 2016-08-27 DIAGNOSIS — R740 Nonspecific elevation of levels of transaminase and lactic acid dehydrogenase [LDH]: Secondary | ICD-10-CM | POA: Diagnosis not present

## 2016-08-27 DIAGNOSIS — E038 Other specified hypothyroidism: Secondary | ICD-10-CM | POA: Diagnosis not present

## 2016-08-27 DIAGNOSIS — R7301 Impaired fasting glucose: Secondary | ICD-10-CM | POA: Diagnosis not present

## 2016-08-27 DIAGNOSIS — Z1389 Encounter for screening for other disorder: Secondary | ICD-10-CM | POA: Diagnosis not present

## 2016-08-27 DIAGNOSIS — Z6826 Body mass index (BMI) 26.0-26.9, adult: Secondary | ICD-10-CM | POA: Diagnosis not present

## 2016-08-27 DIAGNOSIS — E784 Other hyperlipidemia: Secondary | ICD-10-CM | POA: Diagnosis not present

## 2016-08-27 DIAGNOSIS — K5909 Other constipation: Secondary | ICD-10-CM | POA: Diagnosis not present

## 2016-08-27 DIAGNOSIS — Z86010 Personal history of colonic polyps: Secondary | ICD-10-CM | POA: Diagnosis not present

## 2016-09-04 LAB — IFOBT (OCCULT BLOOD): IFOBT: NEGATIVE

## 2016-09-16 ENCOUNTER — Encounter (INDEPENDENT_AMBULATORY_CARE_PROVIDER_SITE_OTHER): Payer: Self-pay | Admitting: Orthopaedic Surgery

## 2016-09-16 ENCOUNTER — Ambulatory Visit (INDEPENDENT_AMBULATORY_CARE_PROVIDER_SITE_OTHER): Payer: Medicare Other | Admitting: Orthopaedic Surgery

## 2016-09-16 ENCOUNTER — Ambulatory Visit (INDEPENDENT_AMBULATORY_CARE_PROVIDER_SITE_OTHER): Payer: Medicare Other

## 2016-09-16 ENCOUNTER — Telehealth (INDEPENDENT_AMBULATORY_CARE_PROVIDER_SITE_OTHER): Payer: Self-pay | Admitting: Orthopaedic Surgery

## 2016-09-16 VITALS — BP 133/71 | HR 85 | Resp 14 | Ht 64.0 in | Wt 143.0 lb

## 2016-09-16 DIAGNOSIS — G8929 Other chronic pain: Secondary | ICD-10-CM

## 2016-09-16 DIAGNOSIS — M25562 Pain in left knee: Secondary | ICD-10-CM

## 2016-09-16 DIAGNOSIS — M25512 Pain in left shoulder: Secondary | ICD-10-CM | POA: Diagnosis not present

## 2016-09-16 MED ORDER — METHYLPREDNISOLONE ACETATE 40 MG/ML IJ SUSP
80.0000 mg | INTRAMUSCULAR | Status: AC | PRN
  Administered 2016-09-16: 80 mg

## 2016-09-16 MED ORDER — BUPIVACAINE HCL 0.5 % IJ SOLN
3.0000 mL | INTRAMUSCULAR | Status: AC | PRN
  Administered 2016-09-16: 3 mL via INTRA_ARTICULAR

## 2016-09-16 MED ORDER — LIDOCAINE HCL 1 % IJ SOLN
5.0000 mL | INTRAMUSCULAR | Status: AC | PRN
  Administered 2016-09-16: 5 mL

## 2016-09-16 NOTE — Progress Notes (Signed)
Office Visit Note   Patient: Sabrina Parks           Date of Birth: May 08, 1941           MRN: [id number] Visit Date: 09/16/2016              Requested by: Marton Redwood, MD [address] [place], [place] [address] PCP: Marton Redwood, MD   Assessment & Plan: Visit Diagnoses: Left shoulder pain is possibly related to either recurrent or chronic re-tearing of the rotator cuff. Left knee pain appears to be osteoarthritis in all 3 compartments but more symptomatic at the patellofemoral joint.   Plan: Cortisone injection left knee, return in 2-3 weeks for reevaluation of left shoulder with possible subacromial cortisone injection. Her knee with present exercise regimen.   Follow-Up Instructions: No Follow-up on file.   Orders:  No orders of the defined types were placed in this encounter.  No orders of the defined types were placed in this encounter.     Procedures: Large Joint Inj Date/Time: 09/16/2016 11:30 AM Performed by: Garald Balding Authorized by: Garald Balding   Consent Given by:  Patient Timeout: prior to procedure the correct patient, procedure, and site was verified   Indications:  Pain and joint swelling Location:  Knee Site:  L knee Prep: patient was prepped and draped in usual sterile fashion   Needle Size:  25 G Needle Length:  1.5 inches Approach:  Anteromedial Ultrasound Guidance: No   Fluoroscopic Guidance: No   Arthrogram: No   Medications:  5 mL lidocaine 1 %; 80 mg methylPREDNISolone acetate 40 MG/ML; 3 mL bupivacaine 0.5 % Aspiration Attempted: No   Patient tolerance:  Patient tolerated the procedure well with no immediate complications     Clinical Data: No additional findings.   Subjective: No chief complaint on file.   Pt presents with Left shoulder and left knee pain today. Denies injury, working in the yard x 3 months ago and pain not resolved. She has difficulty walking up and down stairs, pain in patella region and sometimes  back of left knee. No calf pain, swelling or numbness in feet. She does water aerobics and that seems to make it feel better.  Secondly, left shoulder pain, BIL rotator cuff repair 20+ years ago. She relates she cannot sleep on it anymore and is afraid she is having the same pain as before. Denies neck pain.  Sabrina Parks is having difficulty sleeping on her left shoulder. Denies any recent injury or trauma. Some pain with overhead activity and even in to the biceps muscle.  Review of Systems   Objective: Vital Signs: LMP 07/29/1990   Physical Exam  Ortho Exam left knee exam reveals no evidence of effusion. There is some crepitation beneath both patellae more so left than right. Minimal medial lateral joint pain left knee. Full extension and overall 105 of flexion. No opening with a varus or valgus stress. Negative anterior drawer sign. No calf pain or popliteal fullness. Neurovascular exam intact distally. Positive patellar pain with compression  Overhead motion of left shoulder. Some mild loss of internal rotation. Neurovascular exam intact. Mild impingement on extreme of external rotation. negative empty can testing. Biceps intact mild tenderness over the anterior aspect of the shoulder in the subacromial region.  Specialty Comments:  No specialty comments available.  Imaging: No results found.   PMFS History: Patient Active Problem List   Diagnosis Date Noted  . Lichen sclerosus et atrophicus 06/14/2016  . Hypothyroidism 06/14/2016  .  Hypertension   . Hyperlipidemia   . TIA (transient ischemic attack) 06/12/2012  . HTN (hypertension) 05/20/2011   Past Medical History:  Diagnosis Date  . Alopecia   . Arthritis   . DJD (degenerative joint disease), cervical 5/02   Elsner  . HSV-1 infection   . Hyperlipidemia   . Hypertension   . Lichen sclerosus 0000000   biopsy proven  . Liver hemangioma 2011   Dr. Earlean Shawl  . Thyroiditis   . Transient global amnesia    not fully diag      Family History  Problem Relation Age of Onset  . Stroke Mother   . Cancer Father   . Colon cancer Father     43's    Past Surgical History:  Procedure Laterality Date  . APPENDECTOMY  1956  . CARDIOVASCULAR STRESS TEST  10/29/2006   EF 78%.   Marland Kitchen ROTATOR CUFF REPAIR Bilateral   . TONSILLECTOMY AND ADENOIDECTOMY  1957  . TOTAL ABDOMINAL HYSTERECTOMY W/ BILATERAL SALPINGOOPHORECTOMY  7/90  . US ECHOCARDIOGRAPHY  10/11/2009   EF 55-60%   Social History   Occupational History  . Not on file.   Social History Main Topics  . Smoking status: Former Research scientist (life sciences)  . Smokeless tobacco: Never Used  . Alcohol use Yes     Comment: 1-2 glasses of wine   . Drug use: No  . Sexual activity: Yes    Partners: Male     Comment: hysterectomy

## 2016-09-16 NOTE — Telephone Encounter (Signed)
Patient is requesting a sample of voltaren gel or paper rx for it please. Please advise.

## 2016-09-17 NOTE — Telephone Encounter (Signed)
Please advise 

## 2016-09-18 ENCOUNTER — Other Ambulatory Visit (INDEPENDENT_AMBULATORY_CARE_PROVIDER_SITE_OTHER): Payer: Self-pay | Admitting: Orthopedic Surgery

## 2016-09-18 MED ORDER — DICLOFENAC SODIUM 1 % TD GEL: 2.0000 g | Freq: Four times a day (QID) | TRANSDERMAL | 1 refills | Status: DC

## 2016-09-18 NOTE — Telephone Encounter (Signed)
RX ON YOUR DESK

## 2016-09-19 NOTE — Telephone Encounter (Signed)
Front desk

## 2016-10-20 NOTE — Progress Notes (Signed)
Cardiology Office Note   Date:  10/21/2016   ID:  Sabrina Parks, DOB 1940-10-16, MRN [id number]  PCP:  Marton Redwood, MD  Cardiologist:   Dorris Carnes, MD   F/U of HTN     History of Present Illness: Sabrina Parks is a 76 y.o. female with a history of HTN, HL and palpitations.  Stress test 2011 negaitve for ischemia.  Echo Nov 2013 mild LVH.  LVEF 65 to 06% with Gr I diastlic dysfunc.  2013  Sabrina Parks.  MRA neg  Carotid USN neg  I saw her in March 2017 Denies signif palpitations   Pt denies CP   Foot swollen L foot  1 wks ago  Wine night  No other swelling, No SOB    Pt still goes to  water aerobics  Swimming in circle  Works aobut about 1 1/4 /w  Current Meds  Medication Sig  . amitriptyline (ELAVIL) 25 MG tablet 25 mg daily.  Marland Kitchen amLODipine (NORVASC) 10 MG tablet Take 5 mg by mouth daily.  Marland Kitchen aspirin 81 MG tablet Take 81 mg by mouth daily.  Marland Kitchen BIOTIN PO Take by mouth.  . Cholecalciferol (VITAMIN D-3 PO) Take 1 tablet by mouth daily.  . clobetasol ointment (TEMOVATE) 3.01 % Apply 1 application topically 2 (two) times daily. Apply as directed twice daily  . diclofenac sodium (VOLTAREN) 1 % GEL Apply 2-4 g topically 4 (four) times daily.  . fluticasone (FLONASE) 50 MCG/ACT nasal spray as needed.  Marland Kitchen ibuprofen (ADVIL,MOTRIN) 200 MG tablet Take 200 mg by mouth every 6 (six) hours as needed. For pain  . levothyroxine (SYNTHROID, LEVOTHROID) 50 MCG tablet Take 50 mcg by mouth daily before breakfast.  . LORazepam (ATIVAN) 0.5 MG tablet as needed.  . Multiple Vitamin (MULTIVITAMIN) tablet Take 1 tablet by mouth daily.    Marland Kitchen PROAIR HFA 108 (90 BASE) MCG/ACT inhaler   . rosuvastatin (CRESTOR) 10 MG tablet Take 10 mg by mouth daily.  Marland Kitchen triamcinolone ointment (KENALOG) 0.5 % Apply 1 application topically 2 (two) times daily.  . valsartan-hydrochlorothiazide (DIOVAN-HCT) 160-25 MG per tablet Take 1 tablet by mouth daily. 1/2 tablet daily     Allergies:   Codeine; Morphine; Morphine and  related; and Sulfa antibiotics   Past Medical History:  Diagnosis Date  . Alopecia   . Arthritis   . DJD (degenerative joint disease), cervical 5/02   Sabrina Parks  . HSV-1 infection   . Hyperlipidemia   . Hypertension   . Lichen sclerosus 60/04/9322   biopsy proven  . Liver hemangioma 2011   Sabrina Parks  . Thyroiditis   . Transient global amnesia    not fully diag    Past Surgical History:  Procedure Laterality Date  . APPENDECTOMY  1956  . CARDIOVASCULAR STRESS TEST  10/29/2006   EF 78%.   Marland Kitchen ROTATOR CUFF REPAIR Bilateral   . TONSILLECTOMY AND ADENOIDECTOMY  1957  . TOTAL ABDOMINAL HYSTERECTOMY W/ BILATERAL SALPINGOOPHORECTOMY  7/90  . US ECHOCARDIOGRAPHY  10/11/2009   EF 55-60%     Social History:  The patient  reports that she has quit smoking. She has never used smokeless tobacco. She reports that she drinks alcohol. She reports that she does not use drugs.   Family History:  The patient's family history includes Cancer in her father; Colon cancer in her father; Stroke in her mother.    ROS:  Please see the history of present illness. All other systems are reviewed and  Negative to  the above problem except as noted.    PHYSICAL EXAM: VS:  BP 124/68   Pulse 78   Ht 5\' 4"  (1.626 m)   Wt 153 lb 12.8 oz (69.8 kg)   LMP 07/29/1990   BMI 26.40 kg/m   GEN: Well nourished, well developed, in no acute distress  HEENT: normal  Neck: no JVD, carotid bruits, or masses Cardiac: RRR; no murmurs, rubs, or gallops,no edema  Respiratory:  clear to auscultation bilaterally, normal work of breathing GI: soft, nontender, nondistended, + BS  No hepatomegaly  MS: no deformity Moving all extremities   Skin: warm and dry, no rash Neuro:  Strength and sensation are intact Psych: euthymic mood, full affect   EKG:  EKG is ordered today.  SR  PR intervall 166 msec  LBBB  QRS 130 msec (new)   Lipid Panel No results found for: CHOL, TRIG, HDL, CHOLHDL, VLDL, LDLCALC, LDLDIRECT    Wt  Readings from Last 3 Encounters:  10/21/16 153 lb 12.8 oz (69.8 kg)  09/16/16 143 lb (64.9 kg)  06/13/16 147 lb (66.7 kg)      ASSESSMENT AND PLAN:  1  LBBB  New  She is rel active  I would set up for [employer] given changes to r/o ischemic heart dz   2  Palpitations  Denies signif palpitatons    2  HL   Will get labs from Dr Raul Parks office  3  HTN  Adquate control    Tentative f/u in 1 year   Current medicines are reviewed at length with the patient today.  The patient does not have concerns regarding medicines.  Signed, Dorris Carnes, MD  10/21/2016 3:08 PM    [facility] [place], [place], [place]  [address] Phone: [phone]; Fax: [phone]

## 2016-10-21 ENCOUNTER — Ambulatory Visit (INDEPENDENT_AMBULATORY_CARE_PROVIDER_SITE_OTHER): Payer: Medicare Other | Admitting: Internal Medicine

## 2016-10-21 ENCOUNTER — Encounter (INDEPENDENT_AMBULATORY_CARE_PROVIDER_SITE_OTHER): Payer: Self-pay

## 2016-10-21 ENCOUNTER — Encounter: Payer: Self-pay | Admitting: Internal Medicine

## 2016-10-21 VITALS — BP 124/68 | HR 78 | Ht 64.0 in | Wt 153.8 lb

## 2016-10-21 DIAGNOSIS — I447 Left bundle-branch block, unspecified: Secondary | ICD-10-CM

## 2016-10-21 DIAGNOSIS — E785 Hyperlipidemia, unspecified: Secondary | ICD-10-CM

## 2016-10-21 DIAGNOSIS — R9431 Abnormal electrocardiogram [ECG] [EKG]: Secondary | ICD-10-CM | POA: Diagnosis not present

## 2016-10-21 DIAGNOSIS — R0602 Shortness of breath: Secondary | ICD-10-CM

## 2016-10-21 NOTE — Patient Instructions (Signed)
Your physician recommends that you continue on your current medications as directed. Please refer to the Current Medication list given to you today.  Your physician has requested that you have a lexiscan myoview. For further information please visit [email or web address]. Please follow instruction sheet, as given.  Your physician wants you to follow-up in: 1 year with Dr. Ross.  You will receive a reminder letter in the mail two months in advance. If you don't receive a letter, please call our office to schedule the follow-up appointment.  

## 2016-10-23 ENCOUNTER — Telehealth (HOSPITAL_COMMUNITY): Payer: Self-pay | Admitting: *Deleted

## 2016-10-23 NOTE — Telephone Encounter (Signed)
Left message on voicemail per DPR in reference to upcoming appointment scheduled on 10/29/16 with detailed instructions given per Myocardial Perfusion Study Information Sheet for the test. LM to arrive 15 minutes early, and that it is imperative to arrive on time for appointment to keep from having the test rescheduled. If you need to cancel or reschedule your appointment, please call the office within 24 hours of your appointment. Failure to do so may result in a cancellation of your appointment, and a $50 no show fee. Phone number given for call back for any questions. Kirstie Peri

## 2016-10-29 ENCOUNTER — Ambulatory Visit (HOSPITAL_COMMUNITY): Payer: Medicare Other | Attending: Cardiology

## 2016-10-29 DIAGNOSIS — I10 Essential (primary) hypertension: Secondary | ICD-10-CM | POA: Insufficient documentation

## 2016-10-29 DIAGNOSIS — R002 Palpitations: Secondary | ICD-10-CM | POA: Insufficient documentation

## 2016-10-29 DIAGNOSIS — R0600 Dyspnea, unspecified: Secondary | ICD-10-CM | POA: Diagnosis not present

## 2016-10-29 DIAGNOSIS — R9431 Abnormal electrocardiogram [ECG] [EKG]: Secondary | ICD-10-CM | POA: Diagnosis not present

## 2016-10-29 DIAGNOSIS — I447 Left bundle-branch block, unspecified: Secondary | ICD-10-CM | POA: Diagnosis not present

## 2016-10-29 DIAGNOSIS — R0602 Shortness of breath: Secondary | ICD-10-CM

## 2016-10-29 DIAGNOSIS — E785 Hyperlipidemia, unspecified: Secondary | ICD-10-CM | POA: Diagnosis not present

## 2016-10-29 LAB — MYOCARDIAL PERFUSION IMAGING
LV dias vol: 72 mL (ref 46–106)
LV sys vol: 23 mL
Peak HR: 98 {beats}/min
RATE: 0.28
Rest HR: 78 {beats}/min
SDS: 6
SRS: 5
SSS: 11
TID: 1.14

## 2016-10-29 MED ORDER — REGADENOSON 0.4 MG/5ML IV SOLN
0.4000 mg | Freq: Once | INTRAVENOUS | Status: AC
  Administered 2016-10-29: 0.4 mg via INTRAVENOUS

## 2016-10-29 MED ORDER — TECHNETIUM TC 99M TETROFOSMIN IV KIT
10.9000 | PACK | Freq: Once | INTRAVENOUS | Status: AC | PRN
Start: 2016-10-29 — End: 2016-10-29
  Administered 2016-10-29: 10.9 via INTRAVENOUS
  Filled 2016-10-29: qty 11

## 2016-10-29 MED ORDER — TECHNETIUM TC 99M TETROFOSMIN IV KIT
31.9000 | PACK | Freq: Once | INTRAVENOUS | Status: AC | PRN
  Administered 2016-10-29: 31.9 via INTRAVENOUS
  Filled 2016-10-29: qty 32

## 2016-11-13 DIAGNOSIS — L570 Actinic keratosis: Secondary | ICD-10-CM | POA: Diagnosis not present

## 2016-11-13 DIAGNOSIS — L821 Other seborrheic keratosis: Secondary | ICD-10-CM | POA: Diagnosis not present

## 2016-11-13 DIAGNOSIS — Z85828 Personal history of other malignant neoplasm of skin: Secondary | ICD-10-CM | POA: Diagnosis not present

## 2016-11-18 ENCOUNTER — Other Ambulatory Visit: Payer: Self-pay | Admitting: Internal Medicine

## 2016-11-18 DIAGNOSIS — G4459 Other complicated headache syndrome: Secondary | ICD-10-CM

## 2016-11-18 DIAGNOSIS — Z6826 Body mass index (BMI) 26.0-26.9, adult: Secondary | ICD-10-CM | POA: Diagnosis not present

## 2016-11-18 DIAGNOSIS — R51 Headache: Secondary | ICD-10-CM | POA: Diagnosis not present

## 2016-11-18 DIAGNOSIS — I10 Essential (primary) hypertension: Secondary | ICD-10-CM | POA: Diagnosis not present

## 2016-11-18 DIAGNOSIS — M542 Cervicalgia: Secondary | ICD-10-CM | POA: Diagnosis not present

## 2016-12-02 ENCOUNTER — Ambulatory Visit
Admission: RE | Admit: 2016-12-02 | Discharge: 2016-12-02 | Disposition: A | Discharge status: Home / Self Care | Payer: Medicare Other | Source: Ambulatory Visit | Attending: Internal Medicine | Admitting: Internal Medicine

## 2016-12-02 DIAGNOSIS — G4459 Other complicated headache syndrome: Secondary | ICD-10-CM

## 2016-12-02 DIAGNOSIS — G44221 Chronic tension-type headache, intractable: Secondary | ICD-10-CM | POA: Diagnosis not present

## 2016-12-02 MED ORDER — GADOBENATE DIMEGLUMINE 529 MG/ML IV SOLN
13.0000 mL | Freq: Once | INTRAVENOUS | Status: AC | PRN
  Administered 2016-12-02: 13 mL via INTRAVENOUS

## 2016-12-05 ENCOUNTER — Other Ambulatory Visit: Payer: Self-pay | Admitting: Internal Medicine

## 2016-12-05 DIAGNOSIS — K118 Other diseases of salivary glands: Secondary | ICD-10-CM

## 2016-12-09 ENCOUNTER — Ambulatory Visit
Admission: RE | Admit: 2016-12-09 | Discharge: 2016-12-09 | Disposition: A | Discharge status: Home / Self Care | Payer: Medicare Other | Source: Ambulatory Visit | Attending: Internal Medicine | Admitting: Internal Medicine

## 2016-12-09 DIAGNOSIS — K118 Other diseases of salivary glands: Secondary | ICD-10-CM

## 2016-12-09 DIAGNOSIS — K119 Disease of salivary gland, unspecified: Secondary | ICD-10-CM | POA: Diagnosis not present

## 2016-12-09 MED ORDER — IOPAMIDOL (ISOVUE-300) INJECTION 61%
75.0000 mL | Freq: Once | INTRAVENOUS | Status: AC | PRN
  Administered 2016-12-09: 75 mL via INTRAVENOUS

## 2016-12-16 DIAGNOSIS — Z1231 Encounter for screening mammogram for malignant neoplasm of breast: Secondary | ICD-10-CM | POA: Diagnosis not present

## 2016-12-18 DIAGNOSIS — D110 Benign neoplasm of parotid gland: Secondary | ICD-10-CM | POA: Insufficient documentation

## 2017-01-14 ENCOUNTER — Ambulatory Visit (INDEPENDENT_AMBULATORY_CARE_PROVIDER_SITE_OTHER): Payer: Medicare Other | Admitting: Orthopaedic Surgery

## 2017-01-14 ENCOUNTER — Encounter (INDEPENDENT_AMBULATORY_CARE_PROVIDER_SITE_OTHER): Payer: Self-pay | Admitting: Orthopaedic Surgery

## 2017-01-14 ENCOUNTER — Ambulatory Visit (INDEPENDENT_AMBULATORY_CARE_PROVIDER_SITE_OTHER): Payer: Medicare Other

## 2017-01-14 VITALS — BP 132/60 | HR 80 | Resp 80 | Ht 63.0 in | Wt 153.0 lb

## 2017-01-14 DIAGNOSIS — M25512 Pain in left shoulder: Secondary | ICD-10-CM | POA: Diagnosis not present

## 2017-01-14 DIAGNOSIS — G8929 Other chronic pain: Secondary | ICD-10-CM

## 2017-01-14 MED ORDER — BUPIVACAINE HCL 0.5 % IJ SOLN
2.0000 mL | INTRAMUSCULAR | Status: AC | PRN
  Administered 2017-01-14: 2 mL via INTRA_ARTICULAR

## 2017-01-14 MED ORDER — LIDOCAINE HCL 1 % IJ SOLN
2.0000 mL | INTRAMUSCULAR | Status: AC | PRN
  Administered 2017-01-14: 2 mL

## 2017-01-14 MED ORDER — METHYLPREDNISOLONE ACETATE 40 MG/ML IJ SUSP
80.0000 mg | INTRAMUSCULAR | Status: AC | PRN
  Administered 2017-01-14: 80 mg

## 2017-01-14 NOTE — Progress Notes (Signed)
Office Visit Note   Patient: Sabrina Parks           Date of Birth: 1940/12/30           MRN: [id number] Visit Date: 01/14/2017              Requested by: Marton Redwood, MD [address] [place], [place] [address] PCP: Marton Redwood, MD   Assessment & Plan: Visit Diagnoses:  1. Chronic left shoulder pain   Impingement syndrome left shoulder. Possible recurrent rotator cuff tear  Plan: Long discussion regarding diagnostic possibilities including recurrent rotator cuff tear. Will inject the subacromial region with cortisone and monitor her response  Follow-Up Instructions: Return if symptoms worsen or fail to improve.   Orders:  Orders Placed This Encounter  Procedures  . XR Shoulder Left   No orders of the defined types were placed in this encounter.     Procedures: Large Joint Inj Date/Time: 01/14/2017 12:23 PM Performed by: Garald Balding Authorized by: Garald Balding   Consent Given by:  Patient Timeout: prior to procedure the correct patient, procedure, and site was verified   Indications:  Pain Location:  Shoulder Site:  L subacromial bursa Prep: patient was prepped and draped in usual sterile fashion   Needle Size:  25 G Needle Length:  1.5 inches Approach:  Lateral Ultrasound Guidance: No   Fluoroscopic Guidance: No   Arthrogram: No   Medications:  80 mg methylPREDNISolone acetate 40 MG/ML; 2 mL lidocaine 1 %; 2 mL bupivacaine 0.5 % Aspiration Attempted: No   Patient tolerance:  Patient tolerated the procedure well with no immediate complications     Clinical Data: No additional findings.   Subjective: Chief Complaint  Patient presents with  . Left Shoulder - Pain    Sabrina Parks is a 76 y o that presents with LEFT shoulder pain. X 3 months  [name] has had chronic problems with her left shoulder over." Many months". She denies any history of injury or trauma. Having trouble sleeping at night prompted her office visit. She is over 20 years  status post rotator cuff tear repair by Dr. French Ana. She's having difficulty raising her arm over her head but no history of numbness or tingling.  HPI  Review of Systems   Objective: Vital Signs: BP 132/60   Pulse 80   Resp (!) 80   Ht 5\' 3"  (1.6 m)   Wt 153 lb (69.4 kg)   LMP 07/29/1990   BMI 27.10 kg/m   Physical Exam  Ortho Exam left shoulder with full overhead motion. Some local tenderness directly over the anterior subacromial region. Pain in the lateral subacromial region with abduction. No pain at the acromioclavicular joint. Biceps intact. Good grip and good release. No weakness with internal/external rotation. Positive empty can testing. Positive impingement testing on the extreme of external rotation. No popping or clicking  Specialty Comments:  No specialty comments available.  Imaging: Xr Shoulder Left  Result Date: 01/14/2017 Films of the left shoulder obtained in 2 projections. There seems to be a decrease in the space between the humeral head and acromion. The humeral head is centered about the glenoid. An old metallic anchor from prior rotator cuff surgeries identified. No obvious arthritis of the humeral head or glenoid. No ectopic calcification. Some degenerative changes at the acromioclavicular joint.    PMFS History: Patient Active Problem List   Diagnosis Date Noted  . Lichen sclerosus et atrophicus 06/14/2016  . Hypothyroidism 06/14/2016  . Hypertension   .  Hyperlipidemia   . TIA (transient ischemic attack) 06/12/2012  . HTN (hypertension) 05/20/2011   Past Medical History:  Diagnosis Date  . Alopecia   . Arthritis   . DJD (degenerative joint disease), cervical 5/02   Elsner  . HSV-1 infection   . Hyperlipidemia   . Hypertension   . Lichen sclerosus 03/11/4817   biopsy proven  . Liver hemangioma 2011   Dr. Earlean Shawl  . Thyroiditis   . Transient global amnesia    not fully diag    Family History  Problem Relation Age of Onset  . Stroke  Mother   . Cancer Father   . Colon cancer Father        5's    Past Surgical History:  Procedure Laterality Date  . APPENDECTOMY  1956  . CARDIOVASCULAR STRESS TEST  10/29/2006   EF 78%.   Marland Kitchen ROTATOR CUFF REPAIR Bilateral   . TONSILLECTOMY AND ADENOIDECTOMY  1957  . TOTAL ABDOMINAL HYSTERECTOMY W/ BILATERAL SALPINGOOPHORECTOMY  7/90  . US ECHOCARDIOGRAPHY  10/11/2009   EF 55-60%   Social History   Occupational History  . Not on file.   Social History Main Topics  . Smoking status: Former Research scientist (life sciences)  . Smokeless tobacco: Never Used  . Alcohol use Yes     Comment: 1-2 glasses of wine   . Drug use: No  . Sexual activity: Yes    Partners: Male     Comment: hysterectomy     Garald Balding, MD   Note - This record has been created using Editor, commissioning.  Chart creation errors have been sought, but may not always  have been located. Such creation errors do not reflect on  the standard of medical care.

## 2017-01-27 DIAGNOSIS — L821 Other seborrheic keratosis: Secondary | ICD-10-CM | POA: Diagnosis not present

## 2017-01-27 DIAGNOSIS — Z85828 Personal history of other malignant neoplasm of skin: Secondary | ICD-10-CM | POA: Diagnosis not present

## 2017-01-27 DIAGNOSIS — L918 Other hypertrophic disorders of the skin: Secondary | ICD-10-CM | POA: Diagnosis not present

## 2017-01-27 DIAGNOSIS — L570 Actinic keratosis: Secondary | ICD-10-CM | POA: Diagnosis not present

## 2017-01-27 DIAGNOSIS — C44319 Basal cell carcinoma of skin of other parts of face: Secondary | ICD-10-CM | POA: Diagnosis not present

## 2017-01-27 DIAGNOSIS — D1801 Hemangioma of skin and subcutaneous tissue: Secondary | ICD-10-CM | POA: Diagnosis not present

## 2017-02-10 ENCOUNTER — Telehealth: Payer: Self-pay | Admitting: Obstetrics & Gynecology

## 2017-02-10 NOTE — Telephone Encounter (Signed)
Spoke with patient. Would like to schedule OV with Dr. Sabra Heck for f/u of Lichen Sclerosus and for medication refills. Reports LS is not completely gone and she will be running out of medication, has enough for a few weeks. Patient requesting appointment for 7/26 or 7/27, advised Dr. Sabra Heck has no appointments for dates requested. First available 8/7, patient declined. Patient scheduled for 8/7 at 2:45pm. Patient is agreeable to date and time. Last OV 06/13/16.  Routing to provider for final review. Patient is agreeable to disposition. Will close encounter.   Cc: Dr. Sabra Heck

## 2017-02-10 NOTE — Telephone Encounter (Signed)
Patient would like an appointment for recheck with Dr Sabra Heck.

## 2017-03-06 ENCOUNTER — Encounter: Payer: Self-pay | Admitting: Obstetrics & Gynecology

## 2017-03-06 ENCOUNTER — Ambulatory Visit (INDEPENDENT_AMBULATORY_CARE_PROVIDER_SITE_OTHER): Payer: Medicare Other | Admitting: Obstetrics & Gynecology

## 2017-03-06 VITALS — BP 120/64 | HR 86 | Resp 14 | Ht 63.0 in | Wt 144.5 lb

## 2017-03-06 DIAGNOSIS — L900 Lichen sclerosus et atrophicus: Secondary | ICD-10-CM

## 2017-03-06 MED ORDER — CLOBETASOL PROPIONATE 0.05 % EX OINT: 1.0000 "application " | TOPICAL_OINTMENT | Freq: Two times a day (BID) | CUTANEOUS | 1 refills | Status: DC

## 2017-03-09 NOTE — Progress Notes (Signed)
GYNECOLOGY  VISIT   HPI: 76 y.o. G62P2002 Married Caucasian female here for worsening vulvar itching.  Pt has biopsy proven lichen sclerosus.  Reports that the Clobetasol is the topical steroid that works the best.  She had actually stopped this about a month ago, symptoms started back.  She didn't have very much clobetasol left so she used only a small amount daily.  Reports symptoms are much better but just hasn't gotten it to fully go away.  D/w pt chronic nature of this but need to be able to try and stop clobetasol if possible.  Denies vaginal bleeding.  GYNECOLOGIC HISTORY: Patient's last menstrual period was 07/29/1990. Contraception: PMP Menopausal hormone therapy: none  Patient Active Problem List   Diagnosis Date Noted  . Lichen sclerosus et atrophicus 06/14/2016  . Hypothyroidism 06/14/2016  . Hypertension   . Hyperlipidemia   . TIA (transient ischemic attack) 06/12/2012  . HTN (hypertension) 05/20/2011    Past Medical History:  Diagnosis Date  . Alopecia   . Arthritis   . DJD (degenerative joint disease), cervical 5/02   Elsner  . HSV-1 infection   . Hyperlipidemia   . Hypertension   . Lichen sclerosus 09/60/4540   biopsy proven  . Liver hemangioma 2011   Dr. Earlean Shawl  . Thyroiditis   . Transient global amnesia    not fully diag    Past Surgical History:  Procedure Laterality Date  . APPENDECTOMY  1956  . CARDIOVASCULAR STRESS TEST  10/29/2006   EF 78%.   Marland Kitchen ROTATOR CUFF REPAIR Bilateral   . TONSILLECTOMY AND ADENOIDECTOMY  1957  . TOTAL ABDOMINAL HYSTERECTOMY W/ BILATERAL SALPINGOOPHORECTOMY  7/90  . US ECHOCARDIOGRAPHY  10/11/2009   EF 55-60%    MEDS:   Current Outpatient Prescriptions on File Prior to Visit  Medication Sig Dispense Refill  . amitriptyline (ELAVIL) 25 MG tablet 25 mg daily.    Marland Kitchen amLODipine (NORVASC) 10 MG tablet Take 5 mg by mouth daily.    Marland Kitchen aspirin 81 MG tablet Take 81 mg by mouth daily.    Marland Kitchen BIOTIN PO Take by mouth.    .  Cholecalciferol (VITAMIN D-3 PO) Take 1 tablet by mouth daily.    . diclofenac sodium (VOLTAREN) 1 % GEL Apply 2-4 g topically 4 (four) times daily. 5 Tube 1  . fluticasone (FLONASE) 50 MCG/ACT nasal spray as needed.    Marland Kitchen ibuprofen (ADVIL,MOTRIN) 200 MG tablet Take 200 mg by mouth every 6 (six) hours as needed. For pain    . levothyroxine (SYNTHROID, LEVOTHROID) 50 MCG tablet Take 50 mcg by mouth daily before breakfast.    . LORazepam (ATIVAN) 0.5 MG tablet as needed.    . Multiple Vitamin (MULTIVITAMIN) tablet Take 1 tablet by mouth daily.      Marland Kitchen PROAIR HFA 108 (90 BASE) MCG/ACT inhaler     . rosuvastatin (CRESTOR) 10 MG tablet Take 10 mg by mouth daily.     No current facility-administered medications on file prior to visit.      ALLERGIES: Codeine; Morphine; Morphine and related; and Sulfa antibiotics  Family History  Problem Relation Age of Onset  . Stroke Mother   . Cancer Father   . Colon cancer Father        40's    SH:  Married, non smoker  Review of Systems  All other systems reviewed and are negative.   PHYSICAL EXAMINATION:    BP 120/64 (BP Location: Right Arm, Patient Position: Sitting, Cuff Size: Normal)  Pulse 86   Resp 14   Ht 5\' 3"  (1.6 m)   Wt 144 lb 8 oz (65.5 kg)   LMP 07/29/1990   BMI 25.60 kg/m     Physical Exam  Constitutional: She is oriented to person, place, and time. She appears well-developed and well-nourished.  Genitourinary:    There is no rash, tenderness, lesion or injury on the right labia. There is no rash, tenderness or lesion on the left labia.  Neurological: She is alert and oriented to person, place, and time.  Skin: Skin is warm and dry.  Psychiatric: She has a normal mood and affect.   Assessment: Lichen sclerosus  Plan: Advised pt to start using Clobetasol 0.05% ointment BID for the next 14 days.  She is to give update then.  If symptoms are resolved, she is advised to just stop.  If symptoms are much better, advised will  have her taper off and will give advise for that at that time.  Also, d/w pt chronic nature of this and typical topical products that can cause this.  Appreciative of this information.

## 2017-03-23 DIAGNOSIS — R55 Syncope and collapse: Secondary | ICD-10-CM | POA: Diagnosis not present

## 2017-03-23 DIAGNOSIS — R404 Transient alteration of awareness: Secondary | ICD-10-CM | POA: Diagnosis not present

## 2017-03-27 DIAGNOSIS — R55 Syncope and collapse: Secondary | ICD-10-CM | POA: Diagnosis not present

## 2017-03-27 DIAGNOSIS — Z6825 Body mass index (BMI) 25.0-25.9, adult: Secondary | ICD-10-CM | POA: Diagnosis not present

## 2017-03-27 DIAGNOSIS — Z23 Encounter for immunization: Secondary | ICD-10-CM | POA: Diagnosis not present

## 2017-04-07 ENCOUNTER — Telehealth: Payer: Self-pay | Admitting: Internal Medicine

## 2017-04-07 DIAGNOSIS — Z85828 Personal history of other malignant neoplasm of skin: Secondary | ICD-10-CM | POA: Diagnosis not present

## 2017-04-07 DIAGNOSIS — C44319 Basal cell carcinoma of skin of other parts of face: Secondary | ICD-10-CM | POA: Diagnosis not present

## 2017-04-07 NOTE — Telephone Encounter (Signed)
Pt c/o Syncope: STAT if syncope occurred within 30 minutes and pt complains of lightheadedness High Priority if episode of passing out, completely, today or in last 24 hours   Did you pass out today? no 1. When is the last time you passed out? 04-05-2017   2. Has this occurred multiple times? no  3. Did you have any symptoms prior to passing out? asleep woke up feeling week hot and sweaty, went to the br and passed out

## 2017-04-07 NOTE — Telephone Encounter (Signed)
Spoke to pt on phone Woke up that morning sweating  Got up to bathroom  Next thing she knw waking up on floor.  Reviewed with [facility]aylor Last EKG  LBBB  Please set pt up to see him in EP clinic to discuss what to do

## 2017-04-07 NOTE — Telephone Encounter (Signed)
Patient reports TWO saturdays ago she woke up sweaty, felt weird, walked to bathroom and then woke up on bathroom floor.    EMS came, they checked several BPs and EKG which was "ok". they said it prob her BP.  Saw Dr. Brigitte Pulse a week ago, who agreed but to call cardiology.  She called today.  Dr. Brigitte Pulse stopped amlodipine (5 mg). Checking BP at home running 120/70s.  Feels great and felt great prior.  Lost 10#  Recently.  Advised continue to monitor BP.  Aware I am forwarding to Dr. Harrington Challenger and if anything else to do we will call her.  She is in agreement with this.

## 2017-04-08 NOTE — Telephone Encounter (Signed)
Will send message to EP scheduler. Patient stated she could see Dr. Lovena Le any time after September 23rd. Patient stated she would be out of town next week. Informed patient that EP scheduler would be calling her. Patient verbalized understanding.

## 2017-04-22 ENCOUNTER — Ambulatory Visit (INDEPENDENT_AMBULATORY_CARE_PROVIDER_SITE_OTHER): Payer: Medicare Other | Admitting: Internal Medicine

## 2017-04-22 ENCOUNTER — Encounter: Payer: Self-pay | Admitting: Internal Medicine

## 2017-04-22 VITALS — BP 144/72 | HR 78 | Ht 63.0 in | Wt 142.8 lb

## 2017-04-22 DIAGNOSIS — R55 Syncope and collapse: Secondary | ICD-10-CM | POA: Diagnosis not present

## 2017-04-22 DIAGNOSIS — H52202 Unspecified astigmatism, left eye: Secondary | ICD-10-CM | POA: Diagnosis not present

## 2017-04-22 DIAGNOSIS — H04123 Dry eye syndrome of bilateral lacrimal glands: Secondary | ICD-10-CM | POA: Diagnosis not present

## 2017-04-22 DIAGNOSIS — I447 Left bundle-branch block, unspecified: Secondary | ICD-10-CM | POA: Diagnosis not present

## 2017-04-22 DIAGNOSIS — H26493 Other secondary cataract, bilateral: Secondary | ICD-10-CM | POA: Diagnosis not present

## 2017-04-22 DIAGNOSIS — H43813 Vitreous degeneration, bilateral: Secondary | ICD-10-CM | POA: Diagnosis not present

## 2017-04-22 NOTE — Progress Notes (Signed)
HPI [name] is referred today by Dr. Harrington Challenger for evaluation of unexplained syncope. She has a h/o HTN, palpitations and preserved LV function. She describes a syncopal episode a few weeks ago where she awoke from sleep and felt poorly, walked to the bathroom and as she crossed the threshold, passed out. She is unsure how long she was unconscious. She awoke and felt nausea and diaphoresis. She felt better after about 20-30 minutes. No loss of bowel or bladder continence. No chest pain or sob. No edema. She has LBBB. Allergies  Allergen Reactions  . Codeine Nausea Only  . Morphine Hives  . Morphine And Related Hives  . Sulfa Antibiotics Rash     Current Outpatient Prescriptions  Medication Sig Dispense Refill  . amitriptyline (ELAVIL) 25 MG tablet 25 mg daily.    Marland Kitchen aspirin 81 MG tablet Take 81 mg by mouth daily.    Marland Kitchen BIOTIN PO Take by mouth.    . Cholecalciferol (VITAMIN D-3 PO) Take 1 tablet by mouth daily.    . clobetasol ointment (TEMOVATE) 5.18 % Apply 1 application topically 2 (two) times daily. Apply as directed twice daily 60 g 1  . diclofenac sodium (VOLTAREN) 1 % GEL Apply 2-4 g topically 4 (four) times daily. 5 Tube 1  . fluticasone (FLONASE) 50 MCG/ACT nasal spray as needed.    Marland Kitchen ibuprofen (ADVIL,MOTRIN) 200 MG tablet Take 200 mg by mouth every 6 (six) hours as needed. For pain    . levothyroxine (SYNTHROID, LEVOTHROID) 50 MCG tablet Take 50 mcg by mouth daily before breakfast.    . LORazepam (ATIVAN) 0.5 MG tablet as needed.    Marland Kitchen losartan-hydrochlorothiazide (HYZAAR) 100-25 MG tablet     . PROAIR HFA 108 (90 BASE) MCG/ACT inhaler     . rosuvastatin (CRESTOR) 10 MG tablet Take 10 mg by mouth daily.     No current facility-administered medications for this visit.      Past Medical History:  Diagnosis Date  . Alopecia   . Arthritis   . DJD (degenerative joint disease), cervical 5/02   Elsner  . HSV-1 infection   . Hyperlipidemia   . Hypertension   . Lichen  sclerosus 84/16/6063   biopsy proven  . Liver hemangioma 2011   Dr. Earlean Shawl  . Thyroiditis   . Transient global amnesia    not fully diag    ROS:   All systems reviewed and negative except as noted in the HPI.   Past Surgical History:  Procedure Laterality Date  . APPENDECTOMY  1956  . CARDIOVASCULAR STRESS TEST  10/29/2006   EF 78%.   Marland Kitchen ROTATOR CUFF REPAIR Bilateral   . TONSILLECTOMY AND ADENOIDECTOMY  1957  . TOTAL ABDOMINAL HYSTERECTOMY W/ BILATERAL SALPINGOOPHORECTOMY  7/90  . US ECHOCARDIOGRAPHY  10/11/2009   EF 55-60%     Family History  Problem Relation Age of Onset  . Stroke Mother   . Cancer Father   . Colon cancer Father        71's     Social History   Social History  . Marital status: Married    Spouse name: N/A  . Number of children: N/A  . Years of education: N/A   Occupational History  . Not on file.   Social History Main Topics  . Smoking status: Former Research scientist (life sciences)  . Smokeless tobacco: Never Used  . Alcohol use Yes     Comment: 1-2 glasses of wine   . Drug use:  No  . Sexual activity: Yes    Partners: Male     Comment: hysterectomy   Other Topics Concern  . Not on file   Social History Narrative  . No narrative on file     BP (!) 144/72   Pulse 78   Ht 5\' 3"  (1.6 m)   Wt 142 lb 12.8 oz (64.8 kg)   LMP 07/29/1990   SpO2 98%   BMI 25.30 kg/m   Physical Exam:  Well appearing 76 yo woman, NAD HEENT: Unremarkable Neck:  6 cm JVD, no thyromegally Lymphatics:  No adenopathy Back:  No CVA tenderness Lungs:  Clear with no wheezes HEART:  Regular rate rhythm, no murmurs, no rubs, no clicks Abd:  soft, positive bowel sounds, no organomegally, no rebound, no guarding Ext:  2 plus pulses, no edema, no cyanosis, no clubbing Skin:  No rashes no nodules Neuro:  CN II through XII intact, motor grossly intact  EKG - reviewed. NSR with LBBB   Assess/Plan: 1. Syncope - the etiology is unclear. Her symptoms sound more like a vagally  mediated episode. I have recommended insertion of an ILR.  2. HTN - Her blood pressure is controlled. She is encouraged to maintain a low sodium diet. 3. LBBB - we discussed the implications of syncope in the setting of LBBB. Her episode does not sound like a Stokes-Adams attack. I reviewed the indications for PPM. We will hold off on this unless we see her have heart block on her ILR.  Mikle Bosworth.D.

## 2017-04-22 NOTE — Patient Instructions (Addendum)
Medication Instructions:  Your physician recommends that you continue on your current medications as directed. Please refer to the Current Medication list given to you today.  Labwork: None ordered.  Testing/Procedures: Dr. Lovena Le has suggested you have a loop recorder implanted to monitor your heart rhythms.  Please arrive at the main entrance of [facility] on 05/01/2017 @ 6:30 am.  Your procedure will be at 7:30 am.  You will be discharged home shortly after.   Follow-Up: You will follow up in 7-10 days with the device clinic for a wound check.  You will follow up with Dr. Lovena Le 3 months after your loop insertion.  Any Other Special Instructions Will Be Listed Below (If Applicable).  There are no special instructions.  You may eat breakfast, take your morning pills, and drive yourself home after the procedure.   If you need a refill on your cardiac medications before your next appointment, please call your pharmacy.  If you have any questions or need to reschedule please call me:  Myrtie Hawk RN [phone]

## 2017-05-01 ENCOUNTER — Ambulatory Visit (HOSPITAL_COMMUNITY)
Admission: RE | Admit: 2017-05-01 | Discharge: 2017-05-01 | Disposition: A | Discharge status: Home / Self Care | Payer: Medicare Other | Source: Ambulatory Visit | Attending: Internal Medicine | Admitting: Internal Medicine

## 2017-05-01 ENCOUNTER — Encounter (HOSPITAL_COMMUNITY): Payer: Self-pay | Admitting: Internal Medicine

## 2017-05-01 ENCOUNTER — Encounter (HOSPITAL_COMMUNITY)
Admission: RE | Disposition: A | Discharge status: Home / Self Care | Payer: Self-pay | Source: Ambulatory Visit | Attending: Internal Medicine

## 2017-05-01 DIAGNOSIS — R55 Syncope and collapse: Secondary | ICD-10-CM | POA: Insufficient documentation

## 2017-05-01 DIAGNOSIS — Z882 Allergy status to sulfonamides status: Secondary | ICD-10-CM | POA: Diagnosis not present

## 2017-05-01 DIAGNOSIS — M47892 Other spondylosis, cervical region: Secondary | ICD-10-CM | POA: Insufficient documentation

## 2017-05-01 DIAGNOSIS — Z87891 Personal history of nicotine dependence: Secondary | ICD-10-CM | POA: Insufficient documentation

## 2017-05-01 DIAGNOSIS — R002 Palpitations: Secondary | ICD-10-CM | POA: Insufficient documentation

## 2017-05-01 DIAGNOSIS — I10 Essential (primary) hypertension: Secondary | ICD-10-CM | POA: Diagnosis not present

## 2017-05-01 DIAGNOSIS — E785 Hyperlipidemia, unspecified: Secondary | ICD-10-CM | POA: Diagnosis not present

## 2017-05-01 DIAGNOSIS — Z7982 Long term (current) use of aspirin: Secondary | ICD-10-CM | POA: Insufficient documentation

## 2017-05-01 DIAGNOSIS — Z79899 Other long term (current) drug therapy: Secondary | ICD-10-CM | POA: Diagnosis not present

## 2017-05-01 DIAGNOSIS — Z885 Allergy status to narcotic agent status: Secondary | ICD-10-CM | POA: Insufficient documentation

## 2017-05-01 DIAGNOSIS — I447 Left bundle-branch block, unspecified: Secondary | ICD-10-CM | POA: Insufficient documentation

## 2017-05-01 DIAGNOSIS — G459 Transient cerebral ischemic attack, unspecified: Secondary | ICD-10-CM | POA: Diagnosis present

## 2017-05-01 HISTORY — PX: LOOP RECORDER INSERTION: EP1214

## 2017-05-01 SURGERY — LOOP RECORDER INSERTION

## 2017-05-01 MED ORDER — LIDOCAINE-EPINEPHRINE 1 %-1:100000 IJ SOLN
INTRAMUSCULAR | Status: DC | PRN
  Administered 2017-05-01: 15 mL

## 2017-05-01 MED ORDER — ONDANSETRON HCL 4 MG/2ML IJ SOLN: 4.0000 mg | Freq: Four times a day (QID) | INTRAMUSCULAR | Status: DC | PRN

## 2017-05-01 MED ORDER — ACETAMINOPHEN 325 MG PO TABS: 325.0000 mg | ORAL_TABLET | ORAL | Status: DC | PRN

## 2017-05-01 MED ORDER — LIDOCAINE-EPINEPHRINE 1 %-1:100000 IJ SOLN
INTRAMUSCULAR | Status: AC
  Filled 2017-05-01: qty 1

## 2017-05-01 SURGICAL SUPPLY — 2 items
LOOP REVEAL LINQSYS (Prosthesis & Implant Heart) ×1 IMPLANT
PACK LOOP INSERTION (CUSTOM PROCEDURE TRAY) ×2 IMPLANT

## 2017-05-01 NOTE — Interval H&P Note (Signed)
History and Physical Interval Note:  05/01/2017 7:51 AM  Sabrina Parks  has presented today for surgery, with the diagnosis of syncope  The various methods of treatment have been discussed with the patient and family. After consideration of risks, benefits and other options for treatment, the patient has consented to  Procedure(s): LOOP RECORDER INSERTION (N/A) as a surgical intervention .  The patient's history has been reviewed, patient examined, no change in status, stable for surgery.  I have reviewed the patient's chart and labs.  Questions were answered to the patient's satisfaction.     Cristopher Peru

## 2017-05-01 NOTE — H&P (View-Only) (Signed)
HPI Sabrina Parks is referred today by Dr. Harrington Challenger for evaluation of unexplained syncope. She has a h/o HTN, palpitations and preserved LV function. She describes a syncopal episode a few weeks ago where she awoke from sleep and felt poorly, walked to the bathroom and as she crossed the threshold, passed out. She is unsure how long she was unconscious. She awoke and felt nausea and diaphoresis. She felt better after about 20-30 minutes. No loss of bowel or bladder continence. No chest pain or sob. No edema. She has LBBB. Allergies  Allergen Reactions  . Codeine Nausea Only  . Morphine Hives  . Morphine And Related Hives  . Sulfa Antibiotics Rash     Current Outpatient Prescriptions  Medication Sig Dispense Refill  . amitriptyline (ELAVIL) 25 MG tablet 25 mg daily.    Marland Kitchen aspirin 81 MG tablet Take 81 mg by mouth daily.    Marland Kitchen BIOTIN PO Take by mouth.    . Cholecalciferol (VITAMIN D-3 PO) Take 1 tablet by mouth daily.    . clobetasol ointment (TEMOVATE) 9.83 % Apply 1 application topically 2 (two) times daily. Apply as directed twice daily 60 g 1  . diclofenac sodium (VOLTAREN) 1 % GEL Apply 2-4 g topically 4 (four) times daily. 5 Tube 1  . fluticasone (FLONASE) 50 MCG/ACT nasal spray as needed.    Marland Kitchen ibuprofen (ADVIL,MOTRIN) 200 MG tablet Take 200 mg by mouth every 6 (six) hours as needed. For pain    . levothyroxine (SYNTHROID, LEVOTHROID) 50 MCG tablet Take 50 mcg by mouth daily before breakfast.    . LORazepam (ATIVAN) 0.5 MG tablet as needed.    Marland Kitchen losartan-hydrochlorothiazide (HYZAAR) 100-25 MG tablet     . PROAIR HFA 108 (90 BASE) MCG/ACT inhaler     . rosuvastatin (CRESTOR) 10 MG tablet Take 10 mg by mouth daily.     No current facility-administered medications for this visit.      Past Medical History:  Diagnosis Date  . Alopecia   . Arthritis   . DJD (degenerative joint disease), cervical 5/02   Elsner  . HSV-1 infection   . Hyperlipidemia   . Hypertension   . Lichen  sclerosus 38/25/0539   biopsy proven  . Liver hemangioma 2011   Dr. Earlean Shawl  . Thyroiditis   . Transient global amnesia    not fully diag    ROS:   All systems reviewed and negative except as noted in the HPI.   Past Surgical History:  Procedure Laterality Date  . APPENDECTOMY  1956  . CARDIOVASCULAR STRESS TEST  10/29/2006   EF 78%.   Marland Kitchen ROTATOR CUFF REPAIR Bilateral   . TONSILLECTOMY AND ADENOIDECTOMY  1957  . TOTAL ABDOMINAL HYSTERECTOMY W/ BILATERAL SALPINGOOPHORECTOMY  7/90  . US ECHOCARDIOGRAPHY  10/11/2009   EF 55-60%     Family History  Problem Relation Age of Onset  . Stroke Mother   . Cancer Father   . Colon cancer Father        57's     Social History   Social History  . Marital status: Married    Spouse name: N/A  . Number of children: N/A  . Years of education: N/A   Occupational History  . Not on file.   Social History Main Topics  . Smoking status: Former Research scientist (life sciences)  . Smokeless tobacco: Never Used  . Alcohol use Yes     Comment: 1-2 glasses of wine   . Drug use:  No  . Sexual activity: Yes    Partners: Male     Comment: hysterectomy   Other Topics Concern  . Not on file   Social History Narrative  . No narrative on file     BP (!) 144/72   Pulse 78   Ht 5\' 3"  (1.6 m)   Wt 142 lb 12.8 oz (64.8 kg)   LMP 07/29/1990   SpO2 98%   BMI 25.30 kg/m   Physical Exam:  Well appearing 76 yo woman, NAD HEENT: Unremarkable Neck:  6 cm JVD, no thyromegally Lymphatics:  No adenopathy Back:  No CVA tenderness Lungs:  Clear with no wheezes HEART:  Regular rate rhythm, no murmurs, no rubs, no clicks Abd:  soft, positive bowel sounds, no organomegally, no rebound, no guarding Ext:  2 plus pulses, no edema, no cyanosis, no clubbing Skin:  No rashes no nodules Neuro:  CN II through XII intact, motor grossly intact  EKG - reviewed. NSR with LBBB   Assess/Plan: 1. Syncope - the etiology is unclear. Her symptoms sound more like a vagally  mediated episode. I have recommended insertion of an ILR.  2. HTN - Her blood pressure is controlled. She is encouraged to maintain a low sodium diet. 3. LBBB - we discussed the implications of syncope in the setting of LBBB. Her episode does not sound like a Stokes-Adams attack. I reviewed the indications for PPM. We will hold off on this unless we see her have heart block on her ILR.  Mikle Bosworth.D.

## 2017-05-08 ENCOUNTER — Ambulatory Visit (INDEPENDENT_AMBULATORY_CARE_PROVIDER_SITE_OTHER): Payer: Self-pay | Admitting: *Deleted

## 2017-05-08 DIAGNOSIS — R55 Syncope and collapse: Secondary | ICD-10-CM

## 2017-05-08 LAB — CUP PACEART INCLINIC DEVICE CHECK
Date Time Interrogation Session: 20181011102049
Implantable Pulse Generator Implant Date: 20181004

## 2017-05-08 NOTE — Progress Notes (Signed)
Wound Loop check in clinic. Incision edges unapproxiamted. Steri-Strips applied to educated to keep wound dry until ROV w/ DC on 05/12/2017. Battery status: Good. R-waves 0.16 mV. 1 symptom episodes ~ from implant.  0 tachy episodes, 0  pause episodes, 0 brady episodes. 0 AF episodes Monthly summary reports and ROV with DC 05/12/2017 for wound recheck. ROV w/ GT 06/2017. Pt educated about symptom activator and when to call DC.

## 2017-05-12 ENCOUNTER — Ambulatory Visit (INDEPENDENT_AMBULATORY_CARE_PROVIDER_SITE_OTHER): Payer: Self-pay | Admitting: *Deleted

## 2017-05-12 DIAGNOSIS — R55 Syncope and collapse: Secondary | ICD-10-CM

## 2017-05-12 LAB — CUP PACEART INCLINIC DEVICE CHECK
Date Time Interrogation Session: 20181015144720
Implantable Pulse Generator Implant Date: 20181004

## 2017-05-12 NOTE — Progress Notes (Signed)
Wound recheck appointment. Steri-strips removed. Wound without redness or edema. Incision edges approximated, wound healing well. Normal device function. No symptom, tachy, brady, pause, or AF episodes noted. Patient educated about wound care, Carelink monitor, and symptom activator. ROV with GT on 08/07/17.

## 2017-05-27 DIAGNOSIS — C44729 Squamous cell carcinoma of skin of left lower limb, including hip: Secondary | ICD-10-CM | POA: Diagnosis not present

## 2017-05-27 DIAGNOSIS — Z85828 Personal history of other malignant neoplasm of skin: Secondary | ICD-10-CM | POA: Diagnosis not present

## 2017-05-29 ENCOUNTER — Telehealth: Payer: Self-pay | Admitting: Internal Medicine

## 2017-05-29 NOTE — Telephone Encounter (Signed)
Transmission received. No tachy episodes. 1 symptom episode last night- SR 90s with rare-occasional PACs and PVCs. She reports that she felt like her heart was racing and she couldn't even get to sleep. Feels somewhat better today, but concerned. BP today 864G systolic. She reports that BP meds were changed recently- mentioned that meds may need to be readjusted.

## 2017-05-29 NOTE — Telephone Encounter (Signed)
New message    Patient calling, thinks medication needs adjustment.  Feels like she has racing heartbeat. Started 05/28/17. Please call.  Patient c/o Palpitations:  High priority if patient c/o lightheadedness, shortness of breath, or chest pain  1) How long have you had palpitations/irregular HR/ Afib? Are you having the symptoms now? yes  2) Are you currently experiencing lightheadedness, SOB or CP? NO  3) Do you have a history of afib (atrial fibrillation) or irregular heart rhythm? yes  4) Have you checked your BP or HR? (document readings if available): pulse 91, 145/73  5) Are you experiencing any other symptoms? A little last night

## 2017-05-29 NOTE — Telephone Encounter (Signed)
Requested manual transmission. Sent while we were on the phone. Will review and call pt back.

## 2017-05-30 NOTE — Telephone Encounter (Signed)
Left message for patient to call back  

## 2017-05-30 NOTE — Telephone Encounter (Signed)
Patient called back. She is feeling fine. The only thing different 2 nights ago was she ate a small amount of a chocolate bar.  She is very sensitive to caffeine. Will continue to monitor her BP and will call if abnormal or any more episodes occur. Scheduled March Dr. Harrington Challenger follow up per the recall.

## 2017-06-02 ENCOUNTER — Ambulatory Visit (INDEPENDENT_AMBULATORY_CARE_PROVIDER_SITE_OTHER): Payer: Medicare Other | Admitting: *Deleted

## 2017-06-02 DIAGNOSIS — R55 Syncope and collapse: Secondary | ICD-10-CM | POA: Diagnosis not present

## 2017-06-02 NOTE — Progress Notes (Signed)
Carelink Summary Report / Loop Recorder 

## 2017-06-03 LAB — CUP PACEART REMOTE DEVICE CHECK
Date Time Interrogation Session: 20181103161425
Implantable Pulse Generator Implant Date: 20181004

## 2017-06-16 DIAGNOSIS — Z85828 Personal history of other malignant neoplasm of skin: Secondary | ICD-10-CM | POA: Diagnosis not present

## 2017-06-16 DIAGNOSIS — L570 Actinic keratosis: Secondary | ICD-10-CM | POA: Diagnosis not present

## 2017-06-27 DIAGNOSIS — J029 Acute pharyngitis, unspecified: Secondary | ICD-10-CM | POA: Diagnosis not present

## 2017-06-27 DIAGNOSIS — R509 Fever, unspecified: Secondary | ICD-10-CM | POA: Diagnosis not present

## 2017-06-27 DIAGNOSIS — J111 Influenza due to unidentified influenza virus with other respiratory manifestations: Secondary | ICD-10-CM | POA: Diagnosis not present

## 2017-06-27 DIAGNOSIS — R05 Cough: Secondary | ICD-10-CM | POA: Diagnosis not present

## 2017-06-27 DIAGNOSIS — J328 Other chronic sinusitis: Secondary | ICD-10-CM | POA: Diagnosis not present

## 2017-06-30 ENCOUNTER — Ambulatory Visit (INDEPENDENT_AMBULATORY_CARE_PROVIDER_SITE_OTHER): Payer: Medicare Other | Admitting: *Deleted

## 2017-06-30 DIAGNOSIS — R55 Syncope and collapse: Secondary | ICD-10-CM | POA: Diagnosis not present

## 2017-06-30 NOTE — Progress Notes (Signed)
Carelink Summary Report / Loop Recorder 

## 2017-07-08 LAB — CUP PACEART REMOTE DEVICE CHECK
Date Time Interrogation Session: 20181203171155
Implantable Pulse Generator Implant Date: 20181004

## 2017-07-18 ENCOUNTER — Telehealth: Payer: Self-pay | Admitting: *Deleted

## 2017-07-18 NOTE — Telephone Encounter (Signed)
Spoke with patient and advised that I requested a manual transmission to review full ECG of symptom episode on 06/10/17.  Episode was not auto-detected as a tachy episode due to short duration and was noted on most recent Summary Report.  ECG suggests 14bts SVT at ~165bpm.  Patient reports she was out of town at [employer] and suddenly felt like she was going to pass out.  Symptoms resolved shortly thereafter. Advised patient that going forward, alerts for all symptom episodes have been enabled in Carelink and requested that she call us if she uses her symptom activator.  Patient verbalizes understanding and agreement with plan.  ECG included below.  Will route to Dr. Lovena Le for any additional recommendations.

## 2017-07-18 NOTE — Telephone Encounter (Signed)
LMOM (DPR) requesting manual Carelink transmission for review.  [facility] phone number for questions/concerns.

## 2017-07-18 NOTE — Telephone Encounter (Signed)
Spoke w/ pt and informed her that we received the transmission and it was needed to review the episode a little further. Pt has more questions. Please call back.

## 2017-07-24 NOTE — Telephone Encounter (Signed)
No additional recs. Continue. GT

## 2017-07-30 ENCOUNTER — Ambulatory Visit (INDEPENDENT_AMBULATORY_CARE_PROVIDER_SITE_OTHER): Payer: Medicare Other | Admitting: *Deleted

## 2017-07-30 DIAGNOSIS — R55 Syncope and collapse: Secondary | ICD-10-CM

## 2017-07-31 NOTE — Progress Notes (Signed)
Carelink Summary Report / Loop Recorder 

## 2017-08-07 ENCOUNTER — Encounter: Payer: Medicare Other | Admitting: Internal Medicine

## 2017-08-11 ENCOUNTER — Encounter: Payer: Self-pay | Admitting: Internal Medicine

## 2017-08-11 ENCOUNTER — Ambulatory Visit (INDEPENDENT_AMBULATORY_CARE_PROVIDER_SITE_OTHER): Payer: Medicare Other | Admitting: Internal Medicine

## 2017-08-11 VITALS — BP 118/70 | HR 85 | Ht 64.0 in | Wt 143.0 lb

## 2017-08-11 DIAGNOSIS — R55 Syncope and collapse: Secondary | ICD-10-CM

## 2017-08-11 DIAGNOSIS — I447 Left bundle-branch block, unspecified: Secondary | ICD-10-CM

## 2017-08-11 LAB — CUP PACEART INCLINIC DEVICE CHECK
Date Time Interrogation Session: 20190114141932
Implantable Pulse Generator Implant Date: 20181004

## 2017-08-11 NOTE — Progress Notes (Signed)
HPI [name] returns today for ongoing evaluation and management of syncope, s/p insertion of an ILR. She has known LBBB. Since insertion of her ILR, she has been stable with no syncope. She has very minimal dizzy spells. Allergies  Allergen Reactions  . Codeine Nausea Only  . Morphine Hives  . Morphine And Related Hives  . Sulfa Antibiotics Rash     Current Outpatient Medications  Medication Sig Dispense Refill  . acetaminophen (TYLENOL) 500 MG tablet Take 500 mg by mouth every 6 (six) hours as needed for moderate pain or headache.    Marland Kitchen amitriptyline (ELAVIL) 25 MG tablet Take 25 mg by mouth at bedtime.     Marland Kitchen aspirin 81 MG tablet Take 81 mg by mouth daily.    Marland Kitchen BIOTIN PO Take 1 tablet by mouth daily.     . Cholecalciferol (VITAMIN D-3 PO) Take 1 tablet by mouth daily.    . diclofenac sodium (VOLTAREN) 1 % GEL Apply 2-4 g topically 4 (four) times daily. 5 Tube 1  . ibuprofen (ADVIL,MOTRIN) 200 MG tablet Take 200 mg by mouth every 6 (six) hours as needed for headache or moderate pain.     Marland Kitchen levothyroxine (SYNTHROID, LEVOTHROID) 50 MCG tablet Take 50 mcg by mouth daily before breakfast.    . LORazepam (ATIVAN) 0.5 MG tablet Take 0.5 mg by mouth daily as needed for anxiety.     Marland Kitchen losartan-hydrochlorothiazide (HYZAAR) 100-25 MG tablet Take 1 tablet by mouth daily.     Marland Kitchen PROAIR HFA 108 (90 BASE) MCG/ACT inhaler Inhale 2 puffs into the lungs every 6 (six) hours as needed for wheezing or shortness of breath.     . ranitidine (ZANTAC) 150 MG tablet Take 150 mg by mouth daily.    . rosuvastatin (CRESTOR) 10 MG tablet Take 10 mg by mouth daily.    Marland Kitchen triamcinolone (NASACORT ALLERGY 24HR) 55 MCG/ACT AERO nasal inhaler Place 1 spray into the nose daily as needed (for allergies).     No current facility-administered medications for this visit.      Past Medical History:  Diagnosis Date  . Alopecia   . Arthritis   . DJD (degenerative joint disease), cervical 5/02   Elsner  .  HSV-1 infection   . Hyperlipidemia   . Hypertension   . Lichen sclerosus 81/44/8185   biopsy proven  . Liver hemangioma 2011   Dr. Earlean Shawl  . Thyroiditis   . Transient global amnesia    not fully diag    ROS:   All systems reviewed and negative except as noted in the HPI.   Past Surgical History:  Procedure Laterality Date  . APPENDECTOMY  1956  . CARDIOVASCULAR STRESS TEST  10/29/2006   EF 78%.   Marland Kitchen LOOP RECORDER INSERTION N/A 05/01/2017   Procedure: LOOP RECORDER INSERTION;  Surgeon: Evans Lance, MD;  Location: [facility];  Service: Cardiovascular;  Laterality: N/A;  . ROTATOR CUFF REPAIR Bilateral   . TONSILLECTOMY AND ADENOIDECTOMY  1957  . TOTAL ABDOMINAL HYSTERECTOMY W/ BILATERAL SALPINGOOPHORECTOMY  7/90  . US ECHOCARDIOGRAPHY  10/11/2009   EF 55-60%     Family History  Problem Relation Age of Onset  . Stroke Mother   . Cancer Father   . Colon cancer Father        21's     Social History   Socioeconomic History  . Marital status: Married    Spouse name: Not on file  . Number  of children: Not on file  . Years of education: Not on file  . Highest education level: Not on file  Social Needs  . Financial resource strain: Not on file  . Food insecurity - worry: Not on file  . Food insecurity - inability: Not on file  . Transportation needs - medical: Not on file  . Transportation needs - non-medical: Not on file  Occupational History  . Not on file  Tobacco Use  . Smoking status: Former Research scientist (life sciences)  . Smokeless tobacco: Never Used  Substance and Sexual Activity  . Alcohol use: Yes    Comment: 1-2 glasses of wine   . Drug use: No  . Sexual activity: Yes    Partners: Male    Comment: hysterectomy  Other Topics Concern  . Not on file  Social History Narrative  . Not on file     BP 118/70   Pulse 85   Ht 5\' 4"  (1.626 m)   Wt 143 lb (64.9 kg)   LMP 07/29/1990   BMI 24.55 kg/m   Physical Exam:  Well appearing 77 yo woman, NAD HEENT:  Unremarkable Neck:  6 cm JVD, no thyromegally Lymphatics:  No adenopathy Back:  No CVA tenderness Lungs:  Clear with no wheezes HEART:  Regular rate rhythm, no murmurs, no rubs, no clicks Abd:  soft, positive bowel sounds, no organomegally, no rebound, no guarding Ext:  2 plus pulses, no edema, no cyanosis, no clubbing Skin:  No rashes no nodules Neuro:  CN II through XII intact, motor grossly intact   DEVICE  Normal device function.  See PaceArt for details.   Assess/Plan: 1. Syncope - no tachy or brady episodes. She will undergo watchful waiting. 2. AT - she has had brief episodes of NS atrial tachy of no clinical consequence. Will follow. 3. LBBB - no Stokes Adams attacks yet.   Mikle Bosworth.D.

## 2017-08-11 NOTE — Patient Instructions (Addendum)
Medication Instructions:  Your physician recommends that you continue on your current medications as directed. Please refer to the Current Medication list given to you today.   Labwork: None ordered   Testing/Procedures: None ordered   Follow-Up: Your physician wants you to follow-up in: 9 months with Dr Taylor You will receive a reminder letter in the mail two months in advance. If you don't receive a letter, please call our office to schedule the follow-up appointment.   Any Other Special Instructions Will Be Listed Below (If Applicable).     If you need a refill on your cardiac medications before your next appointment, please call your pharmacy.   

## 2017-08-12 DIAGNOSIS — L570 Actinic keratosis: Secondary | ICD-10-CM | POA: Diagnosis not present

## 2017-08-12 DIAGNOSIS — Z85828 Personal history of other malignant neoplasm of skin: Secondary | ICD-10-CM | POA: Diagnosis not present

## 2017-08-12 LAB — CUP PACEART REMOTE DEVICE CHECK
Date Time Interrogation Session: 20190102174013
Implantable Pulse Generator Implant Date: 20181004

## 2017-08-15 ENCOUNTER — Telehealth: Payer: Self-pay | Admitting: Internal Medicine

## 2017-08-15 NOTE — Telephone Encounter (Signed)
  Patient called regarding a syncopal episode that patient experienced while she was in the bathroom. She claims that she felt her heart race prior the event and believes that she passed out for roughly 8 seconds. Event was witnessed by her husband and this was similar to the event she had in the past. She denies any chest pain and claims that she is asymptomatic now. She doesn't want to come to the hospital but wanted to let us know so that we could evaluate her device. It seems that patient has a hx of atrial tachycardia, but will f/u with her after device interrogation. Also, discussed with her to return to the hospital if she has further events and told her to not drive.   Fellow  Willeen Cass

## 2017-08-16 ENCOUNTER — Telehealth: Payer: Self-pay | Admitting: Physician Assistant

## 2017-08-16 DIAGNOSIS — R55 Syncope and collapse: Secondary | ICD-10-CM

## 2017-08-16 NOTE — Telephone Encounter (Signed)
See fellow note. Pt had called answering service reporting another episode of syncope earlier. Dr. Rayann Heman spoke with rep who interrogated ILR - no associated episodes of arrhythmias to explain events. Spoke with patient. Blood pressure has been running normally in the 130s so I cannot explain this from cardiac standpoint. I do not see any recent labs in system. Pt denies having any bloodwork since this all started. I think at the very least she needs a Hgb checked to exclude significant anemia contributing to syncope, as well as a neuro exam to eval for any focal signs that need to be looked into - have advised she proceed to urgent care for evaluation today and not to drive herself. She verbalized understanding and gratitude. Dayna Dunn PA-C

## 2017-08-16 NOTE — Telephone Encounter (Signed)
Called pt after review of chart She had one spell of syncope yesterday Has had loose BM for past 4 days   Crampy  A little blood  [place] had company for [employer]  Had 2 glasses of wine In bathroom got lightheaded  Went to go to bed [employer] on sofa most of day  Per report no arrhythmia  Sounds multifactorial  Volume loss with autonomic dysfunction possiblly  Recom:  INcrease salt intake  Increase fluid If sympotms of dizzy, palp  Then sit down    Recomm she come in Monday morning for CBC and BMET  \ I am in clinic  Can check BP

## 2017-08-18 ENCOUNTER — Telehealth: Payer: Self-pay | Admitting: *Deleted

## 2017-08-18 ENCOUNTER — Other Ambulatory Visit: Payer: Medicare Other | Admitting: *Deleted

## 2017-08-18 DIAGNOSIS — R55 Syncope and collapse: Secondary | ICD-10-CM | POA: Diagnosis not present

## 2017-08-18 LAB — CBC WITH DIFFERENTIAL/PLATELET
Basophils Absolute: 0 10*3/uL (ref 0.0–0.2)
Basos: 0 %
EOS (ABSOLUTE): 0.2 10*3/uL (ref 0.0–0.4)
Eos: 3 %
Hematocrit: 37.8 % (ref 34.0–46.6)
Hemoglobin: 13.1 g/dL (ref 11.1–15.9)
Immature Grans (Abs): 0 10*3/uL (ref 0.0–0.1)
Immature Granulocytes: 0 %
Lymphocytes Absolute: 2.1 10*3/uL (ref 0.7–3.1)
Lymphs: 27 %
MCH: 29.1 pg (ref 26.6–33.0)
MCHC: 34.7 g/dL (ref 31.5–35.7)
MCV: 84 fL (ref 79–97)
Monocytes Absolute: 0.7 10*3/uL (ref 0.1–0.9)
Monocytes: 9 %
Neutrophils Absolute: 4.6 10*3/uL (ref 1.4–7.0)
Neutrophils: 61 %
Platelets: 354 10*3/uL (ref 150–379)
RBC: 4.5 x10E6/uL (ref 3.77–5.28)
RDW: 13.5 % (ref 12.3–15.4)
WBC: 7.7 10*3/uL (ref 3.4–10.8)

## 2017-08-18 LAB — BASIC METABOLIC PANEL WITH GFR
BUN/Creatinine Ratio: 15 (ref 12–28)
BUN: 11 mg/dL (ref 8–27)
CO2: 27 mmol/L (ref 20–29)
Calcium: 9.7 mg/dL (ref 8.7–10.3)
Chloride: 98 mmol/L (ref 96–106)
Creatinine, Ser: 0.74 mg/dL (ref 0.57–1.00)
GFR calc Af Amer: 91 mL/min/1.73
GFR calc non Af Amer: 79 mL/min/1.73
Glucose: 70 mg/dL (ref 65–99)
Potassium: 3.8 mmol/L (ref 3.5–5.2)
Sodium: 139 mmol/L (ref 134–144)

## 2017-08-18 NOTE — Telephone Encounter (Signed)
Spoke with patient to request a manual transmission for review of symptom episode ECGs (only 1 ECG received automatically but 2 were marked).  Transmission successful, ECGs reveal SR w/PVCs.  ECGs printed and placed in Dr. Macon Large folder for review

## 2017-08-18 NOTE — Addendum Note (Signed)
Addended by: Harland German A on: 08/18/2017 08:11 AM   Modules accepted: Orders

## 2017-08-18 NOTE — Telephone Encounter (Signed)
Patient will arrive at 0930 to speak with Dr. Harrington Challenger and to have blood work. She was grateful for assistance.

## 2017-08-21 DIAGNOSIS — M419 Scoliosis, unspecified: Secondary | ICD-10-CM | POA: Diagnosis not present

## 2017-08-21 DIAGNOSIS — M47816 Spondylosis without myelopathy or radiculopathy, lumbar region: Secondary | ICD-10-CM | POA: Diagnosis not present

## 2017-08-25 DIAGNOSIS — Z86010 Personal history of colonic polyps: Secondary | ICD-10-CM | POA: Diagnosis not present

## 2017-08-25 DIAGNOSIS — K921 Melena: Secondary | ICD-10-CM | POA: Diagnosis not present

## 2017-08-25 DIAGNOSIS — K5901 Slow transit constipation: Secondary | ICD-10-CM | POA: Diagnosis not present

## 2017-08-27 DIAGNOSIS — I10 Essential (primary) hypertension: Secondary | ICD-10-CM | POA: Diagnosis not present

## 2017-08-27 DIAGNOSIS — R55 Syncope and collapse: Secondary | ICD-10-CM | POA: Diagnosis not present

## 2017-08-27 DIAGNOSIS — Z6825 Body mass index (BMI) 25.0-25.9, adult: Secondary | ICD-10-CM | POA: Diagnosis not present

## 2017-08-27 DIAGNOSIS — I951 Orthostatic hypotension: Secondary | ICD-10-CM | POA: Diagnosis not present

## 2017-08-29 ENCOUNTER — Ambulatory Visit (INDEPENDENT_AMBULATORY_CARE_PROVIDER_SITE_OTHER): Payer: Medicare Other | Admitting: *Deleted

## 2017-08-29 DIAGNOSIS — R55 Syncope and collapse: Secondary | ICD-10-CM | POA: Diagnosis not present

## 2017-08-29 NOTE — Progress Notes (Signed)
Carelink Summary Report / Loop Recorder 

## 2017-09-02 DIAGNOSIS — M5136 Other intervertebral disc degeneration, lumbar region: Secondary | ICD-10-CM | POA: Diagnosis not present

## 2017-09-02 DIAGNOSIS — M48061 Spinal stenosis, lumbar region without neurogenic claudication: Secondary | ICD-10-CM | POA: Diagnosis not present

## 2017-09-02 DIAGNOSIS — M5416 Radiculopathy, lumbar region: Secondary | ICD-10-CM | POA: Diagnosis not present

## 2017-09-02 DIAGNOSIS — M4726 Other spondylosis with radiculopathy, lumbar region: Secondary | ICD-10-CM | POA: Diagnosis not present

## 2017-09-02 LAB — CUP PACEART REMOTE DEVICE CHECK
Date Time Interrogation Session: 20190201183955
Implantable Pulse Generator Implant Date: 20181004

## 2017-09-17 ENCOUNTER — Telehealth: Payer: Self-pay | Admitting: Cardiology

## 2017-09-17 NOTE — Telephone Encounter (Signed)
Spoke w/ pt and requested that she send a manual transmission b/c her home monitor has not updated in at least 14 days.   

## 2017-09-30 DIAGNOSIS — I10 Essential (primary) hypertension: Secondary | ICD-10-CM | POA: Diagnosis not present

## 2017-09-30 DIAGNOSIS — E038 Other specified hypothyroidism: Secondary | ICD-10-CM | POA: Diagnosis not present

## 2017-09-30 DIAGNOSIS — R7301 Impaired fasting glucose: Secondary | ICD-10-CM | POA: Diagnosis not present

## 2017-09-30 DIAGNOSIS — R82998 Other abnormal findings in urine: Secondary | ICD-10-CM | POA: Diagnosis not present

## 2017-09-30 DIAGNOSIS — E7849 Other hyperlipidemia: Secondary | ICD-10-CM | POA: Diagnosis not present

## 2017-10-01 ENCOUNTER — Ambulatory Visit (INDEPENDENT_AMBULATORY_CARE_PROVIDER_SITE_OTHER): Payer: Medicare Other | Admitting: *Deleted

## 2017-10-01 DIAGNOSIS — R55 Syncope and collapse: Secondary | ICD-10-CM | POA: Diagnosis not present

## 2017-10-02 ENCOUNTER — Other Ambulatory Visit: Payer: Self-pay | Admitting: Internal Medicine

## 2017-10-02 ENCOUNTER — Encounter (INDEPENDENT_AMBULATORY_CARE_PROVIDER_SITE_OTHER): Payer: Self-pay

## 2017-10-02 ENCOUNTER — Ambulatory Visit (INDEPENDENT_AMBULATORY_CARE_PROVIDER_SITE_OTHER): Payer: Medicare Other | Admitting: Internal Medicine

## 2017-10-02 ENCOUNTER — Encounter: Payer: Self-pay | Admitting: Internal Medicine

## 2017-10-02 VITALS — BP 124/54 | HR 87 | Ht 64.0 in | Wt 146.2 lb

## 2017-10-02 DIAGNOSIS — E785 Hyperlipidemia, unspecified: Secondary | ICD-10-CM

## 2017-10-02 DIAGNOSIS — R55 Syncope and collapse: Secondary | ICD-10-CM | POA: Diagnosis not present

## 2017-10-02 DIAGNOSIS — I447 Left bundle-branch block, unspecified: Secondary | ICD-10-CM

## 2017-10-02 DIAGNOSIS — I10 Essential (primary) hypertension: Secondary | ICD-10-CM

## 2017-10-02 NOTE — Patient Instructions (Addendum)
Your physician recommends that you continue on your current medications as directed. Please refer to the Current Medication list given to you today. Your physician wants you to follow-up in: 1 year with Dr. Harrington Challenger.  You will receive a reminder letter in the mail two months in advance. If you don't receive a letter, please call our office to schedule the follow-up appointment.  Addendum: called Dr. Raul Del office.  Most recent labs to be faxed to [phone].

## 2017-10-02 NOTE — Progress Notes (Signed)
Cardiology Office Note   Date:  10/02/2017   ID:  Sabrina Parks, DOB Nov 19, 1940, MRN [id number]  PCP:  Marton Redwood, MD  Cardiologist:   Dorris Carnes, MD   F/U of HTN     History of Present Illness: Sabrina Parks is a 77 y.o. female with a history of HTN, HL and palpitations.  Stress test 2011 negaitve for ischemia.  Echo Nov 2013 mild LVH.  LVEF 65 to 63% with Gr I diastlic dysfunc.  2013  Sabrina Parks.  MRA neg  Carotid USN neg  I saw her in March 2018  LBBB block on EKG new  Myovue normal  The pt called in September  Woke up one day  Felt weired  Walked to [facility]  Woke up on floor.  Dr Brigitte Pulse had previously stopp amlodipne   Seen in clinic by Beckie Salts  EKG with LBBB   AN ILR implanted    Since then she has had no further spells  She has activated her monitor but strips show NSR  She says she feels good  Very active  No dizziness NO palpitations   No CP Breathing is good Current Meds  Medication Sig  . acetaminophen (TYLENOL) 500 MG tablet Take 500 mg by mouth every 6 (six) hours as needed for moderate pain or headache.  Marland Kitchen amitriptyline (ELAVIL) 25 MG tablet Take 25 mg by mouth at bedtime.   Marland Kitchen aspirin 81 MG tablet Take 81 mg by mouth daily.  Marland Kitchen BIOTIN PO Take 1 tablet by mouth daily.   . Cholecalciferol (VITAMIN D-3 PO) Take 1 tablet by mouth daily.  . diclofenac sodium (VOLTAREN) 1 % GEL Apply 2-4 g topically 4 (four) times daily.  Marland Kitchen ibuprofen (ADVIL,MOTRIN) 200 MG tablet Take 200 mg by mouth every 6 (six) hours as needed for headache or moderate pain.   Marland Kitchen levothyroxine (SYNTHROID, LEVOTHROID) 50 MCG tablet Take 50 mcg by mouth daily before breakfast.  . LORazepam (ATIVAN) 0.5 MG tablet Take 0.5 mg by mouth daily as needed for anxiety.   Marland Kitchen olmesartan (BENICAR) 40 MG tablet Take 40 mg by mouth daily.   Marland Kitchen PROAIR HFA 108 (90 BASE) MCG/ACT inhaler Inhale 2 puffs into the lungs every 6 (six) hours as needed for wheezing or shortness of breath.   . ranitidine (ZANTAC) 150 MG tablet Take  150 mg by mouth daily.  . rosuvastatin (CRESTOR) 10 MG tablet Take 10 mg by mouth daily.  Marland Kitchen triamcinolone (NASACORT ALLERGY 24HR) 55 MCG/ACT AERO nasal inhaler Place 1 spray into the nose daily as needed (for allergies).     Allergies:   Codeine; Morphine; Morphine and related; and Sulfa antibiotics   Past Medical History:  Diagnosis Date  . Alopecia   . Arthritis   . DJD (degenerative joint disease), cervical 5/02   Elsner  . HSV-1 infection   . Hyperlipidemia   . Hypertension   . Lichen sclerosus 33/54/5625   biopsy proven  . Liver hemangioma 2011   Dr. Earlean Shawl  . Thyroiditis   . Transient global amnesia    not fully diag    Past Surgical History:  Procedure Laterality Date  . APPENDECTOMY  1956  . CARDIOVASCULAR STRESS TEST  10/29/2006   EF 78%.   Marland Kitchen LOOP RECORDER INSERTION N/A 05/01/2017   Procedure: LOOP RECORDER INSERTION;  Surgeon: Evans Lance, MD;  Location: [facility];  Service: Cardiovascular;  Laterality: N/A;  . ROTATOR CUFF REPAIR Bilateral   . TONSILLECTOMY AND  ADENOIDECTOMY  1957  . TOTAL ABDOMINAL HYSTERECTOMY W/ BILATERAL SALPINGOOPHORECTOMY  7/90  . US ECHOCARDIOGRAPHY  10/11/2009   EF 55-60%     Social History:  The patient  reports that she has quit smoking. she has never used smokeless tobacco. She reports that she drinks alcohol. She reports that she does not use drugs.   Family History:  The patient's family history includes Cancer in her father; Colon cancer in her father; Stroke in her mother.    ROS:  Please see the history of present illness. All other systems are reviewed and  Negative to the above problem except as noted.    PHYSICAL EXAM: VS:  BP (!) 124/54   Pulse 87   Ht 5\' 4"  (1.626 m)   Wt 146 lb 3.2 oz (66.3 kg)   LMP 07/29/1990   SpO2 97%   BMI 25.10 kg/m   GEN: Well nourished, well developed, in no acute distress  HEENT: normal  Neck: JVP normal  No carotid bruits, or masses Cardiac: RRR; no murmurs, rubs, or  gallops,no edema  Respiratory:  clear to auscultation bilaterally, normal work of breathing GI: soft, nontender, nondistended, + BS  No hepatomegaly  MS: no deformity Moving all extremities   Skin: warm and dry, no rash Neuro:  Strength and sensation are intact Psych: euthymic mood, full affect   EKG:  EKG is not done   Lipid Panel No results found for: CHOL, TRIG, HDL, CHOLHDL, VLDL, LDLCALC, LDLDIRECT    Wt Readings from Last 3 Encounters:  10/02/17 146 lb 3.2 oz (66.3 kg)  08/11/17 143 lb (64.9 kg)  05/01/17 140 lb (63.5 kg)      ASSESSMENT AND PLAN:  1   Syncope  No further spells   Has ILF  2  Palpitations  Denies    3  LBBB     4  HL  Will review lipids from Dr Manya Silvas office  5  HTN  Good control     Tentative f/u in 1 year   Current medicines are reviewed at length with the patient today.  The patient does not have concerns regarding medicines.  Signed, Dorris Carnes, MD  10/02/2017 10:54 PM    [address], [place], [place]  [address] Phone: [phone]; Fax: [phone]

## 2017-10-02 NOTE — Progress Notes (Signed)
Carelink Summary Report / Loop Recorder 

## 2017-10-07 DIAGNOSIS — Z1389 Encounter for screening for other disorder: Secondary | ICD-10-CM | POA: Diagnosis not present

## 2017-10-07 DIAGNOSIS — Z6825 Body mass index (BMI) 25.0-25.9, adult: Secondary | ICD-10-CM | POA: Diagnosis not present

## 2017-10-07 DIAGNOSIS — E7849 Other hyperlipidemia: Secondary | ICD-10-CM | POA: Diagnosis not present

## 2017-10-07 DIAGNOSIS — R7301 Impaired fasting glucose: Secondary | ICD-10-CM | POA: Diagnosis not present

## 2017-10-07 DIAGNOSIS — K5909 Other constipation: Secondary | ICD-10-CM | POA: Diagnosis not present

## 2017-10-07 DIAGNOSIS — I10 Essential (primary) hypertension: Secondary | ICD-10-CM | POA: Diagnosis not present

## 2017-10-07 DIAGNOSIS — E038 Other specified hypothyroidism: Secondary | ICD-10-CM | POA: Diagnosis not present

## 2017-10-07 DIAGNOSIS — D320 Benign neoplasm of cerebral meninges: Secondary | ICD-10-CM | POA: Diagnosis not present

## 2017-10-07 DIAGNOSIS — J019 Acute sinusitis, unspecified: Secondary | ICD-10-CM | POA: Diagnosis not present

## 2017-10-07 DIAGNOSIS — Z0000 Encounter for general adult medical examination without abnormal findings: Secondary | ICD-10-CM | POA: Diagnosis not present

## 2017-10-07 DIAGNOSIS — Z23 Encounter for immunization: Secondary | ICD-10-CM | POA: Diagnosis not present

## 2017-10-07 DIAGNOSIS — R110 Nausea: Secondary | ICD-10-CM | POA: Diagnosis not present

## 2017-10-07 DIAGNOSIS — K118 Other diseases of salivary glands: Secondary | ICD-10-CM | POA: Diagnosis not present

## 2017-10-20 DIAGNOSIS — K5901 Slow transit constipation: Secondary | ICD-10-CM | POA: Diagnosis not present

## 2017-11-03 ENCOUNTER — Ambulatory Visit (INDEPENDENT_AMBULATORY_CARE_PROVIDER_SITE_OTHER): Payer: Medicare Other | Admitting: *Deleted

## 2017-11-03 DIAGNOSIS — R55 Syncope and collapse: Secondary | ICD-10-CM | POA: Diagnosis not present

## 2017-11-04 ENCOUNTER — Telehealth: Payer: Self-pay | Admitting: Obstetrics & Gynecology

## 2017-11-04 NOTE — Telephone Encounter (Signed)
Spoke with patient. Patient states she is having an ongoing lichen sclerosus flare. Has used topical clobetasol BID x 1 week. Reports once she stops using the steroid cream flare reoccurs. Wants to be seen for a recheck with Dr.Miller. Patient found a lump in her right axilla region close to her breast. Reports lump is tender to the touch. Denies and swelling, redness, or warmth to the breast. Patient declines all appointments this week. Appointment scheduled for 11/10/2017 at 2:30 pm with Dr.Miller. Advised if symptoms worsen or develops new symptoms will need to be seen in office earlier for further evaluation. Patient is agreeable.  Routing to provider for final review. Patient agreeable to disposition. Will close encounter.

## 2017-11-04 NOTE — Progress Notes (Signed)
Carelink Summary Report / Loop Recorder 

## 2017-11-04 NOTE — Telephone Encounter (Signed)
Patient would like to make follow up appointment for Lichen sclerosis and she also has a knot located under right arm, near side of breast. Knot has been checked before, but it is bothering her more now.

## 2017-11-10 ENCOUNTER — Telehealth: Payer: Self-pay | Admitting: Obstetrics & Gynecology

## 2017-11-10 ENCOUNTER — Ambulatory Visit: Payer: Medicare Other | Admitting: Obstetrics & Gynecology

## 2017-11-10 NOTE — Telephone Encounter (Signed)
Patient called and cancelled her appointment for lichen sclerosus, knot under right breast for today with Dr. Sabra Heck due to a "personal emergency." Patient declined to give any more details. She rescheduled to Wednesday, 11/12/17 at 2:30 PM.

## 2017-11-11 LAB — CUP PACEART REMOTE DEVICE CHECK
Date Time Interrogation Session: 20190306183645
Implantable Pulse Generator Implant Date: 20181004

## 2017-11-12 ENCOUNTER — Encounter: Payer: Self-pay | Admitting: Obstetrics & Gynecology

## 2017-11-12 ENCOUNTER — Ambulatory Visit (INDEPENDENT_AMBULATORY_CARE_PROVIDER_SITE_OTHER): Payer: Medicare Other | Admitting: Obstetrics & Gynecology

## 2017-11-12 ENCOUNTER — Other Ambulatory Visit: Payer: Self-pay

## 2017-11-12 VITALS — BP 120/56 | HR 92 | Resp 14 | Ht 64.0 in | Wt 146.0 lb

## 2017-11-12 DIAGNOSIS — L900 Lichen sclerosus et atrophicus: Secondary | ICD-10-CM | POA: Diagnosis not present

## 2017-11-12 DIAGNOSIS — M791 Myalgia, unspecified site: Secondary | ICD-10-CM

## 2017-11-12 MED ORDER — CLOBETASOL PROPIONATE 0.05 % EX OINT: 1.0000 "application " | TOPICAL_OINTMENT | Freq: Two times a day (BID) | CUTANEOUS | 0 refills | Status: DC

## 2017-11-12 NOTE — Progress Notes (Signed)
GYNECOLOGY  VISIT  CC:   Sore place in breast  HPI: 77 y.o. G78P2002 Married Caucasian female here for complaint of a sore place in her back that radiates to under her right arm at times.  Sometimes it even feels like there is a lump or knot in her back.  Would like this evaluated.  Not really having a lot of issues with her lichen sclerosus.  Feels it has never fully "resolved" and so wanted to have a recheck.  Is not having any itching or burning.  Not really using the Clobetasol at this time.  Denies vaginal discharge or vaginal bleeding.   Last MMG was last per, per pt, but I do not have this in my records.  States she is scheduled for next one in May, in less than a month.  Denies any new breast issues--no masses, skin changes, nipple discharge.  GYNECOLOGIC HISTORY: Patient's last menstrual period was 07/29/1990. Contraception: post menopausal  Menopausal hormone therapy: none  Patient Active Problem List   Diagnosis Date Noted  . Lichen sclerosus et atrophicus 06/14/2016  . Hypothyroidism 06/14/2016  . Hypertension   . Hyperlipidemia   . TIA (transient ischemic attack) 06/12/2012  . HTN (hypertension) 05/20/2011    Past Medical History:  Diagnosis Date  . Alopecia   . Arthritis   . DJD (degenerative joint disease), cervical 5/02   Elsner  . HSV-1 infection   . Hyperlipidemia   . Hypertension   . Lichen sclerosus 30/86/5784   biopsy proven  . Liver hemangioma 2011   Dr. Earlean Shawl  . Thyroiditis   . Transient global amnesia    not fully diag    Past Surgical History:  Procedure Laterality Date  . APPENDECTOMY  1956  . CARDIOVASCULAR STRESS TEST  10/29/2006   EF 78%.   Marland Kitchen LOOP RECORDER INSERTION N/A 05/01/2017   Procedure: LOOP RECORDER INSERTION;  Surgeon: Evans Lance, MD;  Location: [facility];  Service: Cardiovascular;  Laterality: N/A;  . ROTATOR CUFF REPAIR Bilateral   . TONSILLECTOMY AND ADENOIDECTOMY  1957  . TOTAL ABDOMINAL HYSTERECTOMY W/  BILATERAL SALPINGOOPHORECTOMY  7/90  . US ECHOCARDIOGRAPHY  10/11/2009   EF 55-60%    MEDS:   Current Outpatient Medications on File Prior to Visit  Medication Sig Dispense Refill  . acetaminophen (TYLENOL) 500 MG tablet Take 500 mg by mouth every 6 (six) hours as needed for moderate pain or headache.    Marland Kitchen amitriptyline (ELAVIL) 25 MG tablet Take 25 mg by mouth at bedtime.     Marland Kitchen aspirin 81 MG tablet Take 81 mg by mouth daily.    Marland Kitchen BIOTIN PO Take 1 tablet by mouth daily.     . Cholecalciferol (VITAMIN D-3 PO) Take 1 tablet by mouth daily.    . diclofenac sodium (VOLTAREN) 1 % GEL Apply 2-4 g topically 4 (four) times daily. 5 Tube 1  . ibuprofen (ADVIL,MOTRIN) 200 MG tablet Take 200 mg by mouth every 6 (six) hours as needed for headache or moderate pain.     Marland Kitchen levothyroxine (SYNTHROID, LEVOTHROID) 50 MCG tablet Take 50 mcg by mouth daily before breakfast.    . LORazepam (ATIVAN) 0.5 MG tablet Take 0.5 mg by mouth daily as needed for anxiety.     Marland Kitchen olmesartan (BENICAR) 40 MG tablet Take 40 mg by mouth daily.     Marland Kitchen PROAIR HFA 108 (90 BASE) MCG/ACT inhaler Inhale 2 puffs into the lungs every 6 (six) hours as needed for  wheezing or shortness of breath.     . ranitidine (ZANTAC) 150 MG tablet Take 150 mg by mouth daily.    . rosuvastatin (CRESTOR) 10 MG tablet Take 10 mg by mouth daily.    Marland Kitchen triamcinolone (NASACORT ALLERGY 24HR) 55 MCG/ACT AERO nasal inhaler Place 1 spray into the nose daily as needed (for allergies).     No current facility-administered medications on file prior to visit.     ALLERGIES: Codeine; Morphine; Morphine and related; and Sulfa antibiotics  Family History  Problem Relation Age of Onset  . Stroke Mother   . Cancer Father   . Colon cancer Father        89's    SH:  Married, non smoker  Review of Systems  All other systems reviewed and are negative.   PHYSICAL EXAMINATION:    BP (!) 120/56 (BP Location: Right Arm, Patient Position: Sitting, Cuff Size:  Normal)   Pulse 92   Resp 14   Ht 5\' 4"  (1.626 m)   Wt 146 lb (66.2 kg)   LMP 07/29/1990   BMI 25.06 kg/m     Physical Exam  Constitutional: She is oriented to person, place, and time. She appears well-developed and well-nourished.  Cardiovascular: Normal rate and regular rhythm.  Respiratory: Effort normal and breath sounds normal.     Right breast exhibits no inverted nipple, no mass, no nipple discharge, no skin change and no tenderness. Left breast exhibits no inverted nipple, no mass, no nipple discharge, no skin change and no tenderness. Breasts are symmetrical.  Genitourinary: Vagina normal. There is no rash, tenderness, lesion or injury on the right labia. There is no rash, tenderness, lesion or injury on the left labia.  Lymphadenopathy:       Right: No inguinal adenopathy present.       Left: No inguinal adenopathy present.  Neurological: She is alert and oriented to person, place, and time.  Skin: Skin is warm and dry.  Psychiatric: She has a normal mood and affect.    Chaperone was present for exam.  Assessment: Muscular knot-like area on back, very tender to palpation.  Not a mass but area of muscular tension.  Normal breast exam H/o biopsy proven lichen sclerosus (10/31) with very normal appearing vulvar exam today.  Plan: Clobetasol 0.05% ointment rx to pharmacy.  Does not need now but would like to have on have.  Chronic nature of this skin disorder reviewed.  Pt much more comfortable about this now.  Didn't quite understand that this would be chronic in nature. Reassured about finding on back.  Feel she needs massage therapy.  Pt happy about needing to do this. MMG due in May.     ~15 minutes spent with patient >50% of time was in face to face discussion of above.

## 2017-11-17 ENCOUNTER — Telehealth: Payer: Self-pay

## 2017-11-17 NOTE — Telephone Encounter (Signed)
Spoke with pt regarding symptom episode pt stated that she felt like her heart was beating funny or a few seconds but then it quit, informed pt that there was nothing concerning on the transmission some PVCs pt voiced understanding and appreciative of call back

## 2017-11-17 NOTE — Telephone Encounter (Signed)
LVM for pt to call back regarding symptom episode from 4/20@ 1045

## 2017-12-05 ENCOUNTER — Other Ambulatory Visit: Payer: Self-pay | Admitting: Internal Medicine

## 2017-12-05 ENCOUNTER — Telehealth: Payer: Self-pay | Admitting: Internal Medicine

## 2017-12-05 DIAGNOSIS — D329 Benign neoplasm of meninges, unspecified: Secondary | ICD-10-CM

## 2017-12-05 LAB — CUP PACEART REMOTE DEVICE CHECK
Date Time Interrogation Session: 20190408193931
Implantable Pulse Generator Implant Date: 20181004

## 2017-12-05 NOTE — Telephone Encounter (Signed)
Called to inform patient that she can have an MRI with her ILR. Patient verbalized understanding.

## 2017-12-05 NOTE — Telephone Encounter (Signed)
Unable to LVM - line busy. May have MRI - ILR implanted 04/2017

## 2017-12-05 NOTE — Telephone Encounter (Signed)
New message  Patient wants to know if she can get MRI Brain with her device. Please call  1. Has your device fired? NO  2. Is you device beeping? NO  3. Are you experiencing draining or swelling at device site? NO  4. Are you calling to see if we received your device transmission? NO  5. Have you passed out? NO    Please route to [place]

## 2017-12-08 ENCOUNTER — Ambulatory Visit (INDEPENDENT_AMBULATORY_CARE_PROVIDER_SITE_OTHER): Payer: Medicare Other | Admitting: *Deleted

## 2017-12-08 DIAGNOSIS — R55 Syncope and collapse: Secondary | ICD-10-CM | POA: Diagnosis not present

## 2017-12-09 NOTE — Progress Notes (Signed)
Carelink Summary Report / Loop Recorder 

## 2017-12-15 ENCOUNTER — Ambulatory Visit
Admission: RE | Admit: 2017-12-15 | Discharge: 2017-12-15 | Disposition: A | Discharge status: Home / Self Care | Payer: Medicare Other | Source: Ambulatory Visit | Attending: Internal Medicine | Admitting: Internal Medicine

## 2017-12-15 DIAGNOSIS — D329 Benign neoplasm of meninges, unspecified: Secondary | ICD-10-CM | POA: Diagnosis not present

## 2017-12-15 MED ORDER — GADOBENATE DIMEGLUMINE 529 MG/ML IV SOLN
13.0000 mL | Freq: Once | INTRAVENOUS | Status: AC | PRN
  Administered 2017-12-15: 13 mL via INTRAVENOUS

## 2017-12-18 DIAGNOSIS — Z1231 Encounter for screening mammogram for malignant neoplasm of breast: Secondary | ICD-10-CM | POA: Diagnosis not present

## 2017-12-31 LAB — CUP PACEART REMOTE DEVICE CHECK
Date Time Interrogation Session: 20190511200803
Implantable Pulse Generator Implant Date: 20181004

## 2018-01-01 ENCOUNTER — Encounter (INDEPENDENT_AMBULATORY_CARE_PROVIDER_SITE_OTHER): Payer: Self-pay | Admitting: Orthopaedic Surgery

## 2018-01-01 ENCOUNTER — Ambulatory Visit (INDEPENDENT_AMBULATORY_CARE_PROVIDER_SITE_OTHER): Payer: Medicare Other | Admitting: Orthopaedic Surgery

## 2018-01-01 VITALS — Resp 16 | Ht 63.75 in | Wt 143.0 lb

## 2018-01-01 DIAGNOSIS — M25552 Pain in left hip: Secondary | ICD-10-CM | POA: Diagnosis not present

## 2018-01-01 MED ORDER — LIDOCAINE HCL 1 % IJ SOLN
2.0000 mL | INTRAMUSCULAR | Status: AC | PRN
  Administered 2018-01-01: 2 mL

## 2018-01-01 MED ORDER — BUPIVACAINE HCL 0.5 % IJ SOLN
2.0000 mL | INTRAMUSCULAR | Status: AC | PRN
  Administered 2018-01-01: 2 mL via INTRA_ARTICULAR

## 2018-01-01 MED ORDER — METHYLPREDNISOLONE ACETATE 40 MG/ML IJ SUSP
80.0000 mg | INTRAMUSCULAR | Status: AC | PRN
  Administered 2018-01-01: 80 mg

## 2018-01-01 NOTE — Progress Notes (Addendum)
Office Visit Note   Patient: Sabrina Parks           Date of Birth: 1940/11/09           MRN: [id number] Visit Date: 01/01/2018              Requested by: Marton Redwood, MD [address] [place], [place] [address] PCP: Marton Redwood, MD   Assessment & Plan: Visit Diagnoses:  1. Pain of left hip joint     Plan: Greater trochanteric bursitis left hip.  Will inject with cortisone and monitor response.  Return as needed  Follow-Up Instructions: Return if symptoms worsen or fail to improve.   Orders:  No orders of the defined types were placed in this encounter.  No orders of the defined types were placed in this encounter.     Procedures: Large Joint Inj: L greater trochanter on 01/01/2018 4:49 PM Indications: pain and diagnostic evaluation Details: 25 G 1.5 in needle, lateral approach  Arthrogram: No  Medications: 2 mL lidocaine 1 %; 2 mL bupivacaine 0.5 %; 80 mg methylPREDNISolone acetate 40 MG/ML Procedure, treatment alternatives, risks and benefits explained, specific risks discussed. Consent was given by the patient. Immediately prior to procedure a time out was called to verify the correct patient, procedure, equipment, support staff and site/side marked as required. Patient was prepped and draped in the usual sterile fashion.       Clinical Data: No additional findings.   Subjective: Chief Complaint  Patient presents with  . Left Hip - Pain  . Hip Pain    Left hip pain x 1 month, no injury, not diabetic, no surgery to left hip, difficulty standing, difficulty sleeping on left side, Advil helps  Sabrina Parks had insidious onset of lateral left hip pain approximately month ago.  No history of injury or trauma.  No fever chills or skin rashes.  Not had any groin pain.  Her past history is significant that she has had some issues with her lumbar spine and is seeing Dr. Ellene Route.  She is had several injections in the lumbar spine in the past with excellent relief of her pain.   Presently she is not that symptomatic and does not feel that the problem with her left hip is related to her back.  No numbness or tingling.  HPI  Review of Systems  Constitutional: Positive for activity change.  HENT: Negative for trouble swallowing.   Eyes: Negative for pain.  Respiratory: Negative for shortness of breath.   Cardiovascular: Negative for leg swelling.  Gastrointestinal: Negative for constipation.  Endocrine: Positive for heat intolerance.  Genitourinary: Negative for difficulty urinating.  Musculoskeletal: Negative for gait problem.  Skin: Negative for rash.  Allergic/Immunologic: Negative for food allergies.  Neurological: Negative for numbness.  Hematological: Does not bruise/bleed easily.  Psychiatric/Behavioral: Positive for sleep disturbance.     Objective: Vital Signs: Resp 16   Ht 5' 3.75" (1.619 m)   Wt 143 lb (64.9 kg)   LMP 07/29/1990   BMI 24.74 kg/m   Physical Exam  Constitutional: She is oriented to person, place, and time. She appears well-developed and well-nourished.  HENT:  Mouth/Throat: Oropharynx is clear and moist.  Eyes: Pupils are equal, round, and reactive to light. EOM are normal.  Pulmonary/Chest: Effort normal.  Neurological: She is alert and oriented to person, place, and time.  Skin: Skin is warm and dry.  Psychiatric: She has a normal mood and affect. Her behavior is normal.    Ortho Exam  awake alert and oriented x3.  Comfortable sitting.  Walks without a limp below she can localize pain directly over the greater trochanter of her left hip.  No pain with internal/external rotation of the left hip.  Straight leg raise is negative.  No percussible tenderness of the lumbar spine.  Skin intact.  Neurovascular exam intact  Specialty Comments:  No specialty comments available.  Imaging: No results found.   PMFS History: Patient Active Problem List   Diagnosis Date Noted  . Lichen sclerosus et atrophicus 06/14/2016  .  Hypothyroidism 06/14/2016  . Hypertension   . Hyperlipidemia   . TIA (transient ischemic attack) 06/12/2012  . HTN (hypertension) 05/20/2011   Past Medical History:  Diagnosis Date  . Alopecia   . Arthritis   . DJD (degenerative joint disease), cervical 5/02   Elsner  . HSV-1 infection   . Hyperlipidemia   . Hypertension   . Lichen sclerosus 56/81/2751   biopsy proven  . Liver hemangioma 2011   Dr. Earlean Shawl  . Thyroiditis   . Transient global amnesia    not fully diag    Family History  Problem Relation Age of Onset  . Stroke Mother   . Cancer Father   . Colon cancer Father        45's    Past Surgical History:  Procedure Laterality Date  . APPENDECTOMY  1956  . CARDIOVASCULAR STRESS TEST  10/29/2006   EF 78%.   Marland Kitchen LOOP RECORDER INSERTION N/A 05/01/2017   Procedure: LOOP RECORDER INSERTION;  Surgeon: Evans Lance, MD;  Location: [facility];  Service: Cardiovascular;  Laterality: N/A;  . ROTATOR CUFF REPAIR Bilateral   . SHOULDER SURGERY    . TONSILLECTOMY AND ADENOIDECTOMY  1957  . TOTAL ABDOMINAL HYSTERECTOMY W/ BILATERAL SALPINGOOPHORECTOMY  7/90  . US ECHOCARDIOGRAPHY  10/11/2009   EF 55-60%   Social History   Occupational History  . Not on file  Tobacco Use  . Smoking status: Former Smoker    Years: 10.00    Types: Cigarettes    Last attempt to quit: 01/01/1970    Years since quitting: 48.0  . Smokeless tobacco: Never Used  Substance and Sexual Activity  . Alcohol use: Yes    Comment: 1-2 glasses of wine   . Drug use: No  . Sexual activity: Yes    Partners: Male    Comment: hysterectomy

## 2018-01-08 ENCOUNTER — Ambulatory Visit (INDEPENDENT_AMBULATORY_CARE_PROVIDER_SITE_OTHER): Payer: Medicare Other | Admitting: *Deleted

## 2018-01-08 DIAGNOSIS — R55 Syncope and collapse: Secondary | ICD-10-CM | POA: Diagnosis not present

## 2018-01-09 NOTE — Progress Notes (Signed)
Carelink Summary Report / Loop Recorder 

## 2018-01-15 ENCOUNTER — Other Ambulatory Visit: Payer: Self-pay | Admitting: Internal Medicine

## 2018-01-22 DIAGNOSIS — K5901 Slow transit constipation: Secondary | ICD-10-CM | POA: Diagnosis not present

## 2018-01-27 ENCOUNTER — Other Ambulatory Visit (INDEPENDENT_AMBULATORY_CARE_PROVIDER_SITE_OTHER): Payer: Self-pay | Admitting: Radiology

## 2018-01-27 ENCOUNTER — Ambulatory Visit (INDEPENDENT_AMBULATORY_CARE_PROVIDER_SITE_OTHER): Payer: Self-pay

## 2018-01-27 ENCOUNTER — Ambulatory Visit (INDEPENDENT_AMBULATORY_CARE_PROVIDER_SITE_OTHER): Payer: Medicare Other | Admitting: Orthopaedic Surgery

## 2018-01-27 ENCOUNTER — Encounter (INDEPENDENT_AMBULATORY_CARE_PROVIDER_SITE_OTHER): Payer: Self-pay | Admitting: Orthopaedic Surgery

## 2018-01-27 VITALS — BP 135/63 | HR 79 | Ht 63.75 in | Wt 145.0 lb

## 2018-01-27 DIAGNOSIS — M25552 Pain in left hip: Secondary | ICD-10-CM

## 2018-01-27 NOTE — Progress Notes (Signed)
Office Visit Note   Patient: Sabrina Parks           Date of Birth: [birth year]           MRN: [id number] Visit Date: 01/27/2018              Requested by: Marton Redwood, MD [address] [place], [place] [address] PCP: Marton Redwood, MD   Assessment & Plan: Visit Diagnoses:  1. Pain in left hip     Plan: Persistent pain over the greater trochanter left hip several diagnostic possibilities including referred pain from her back.  Early osteoarthritis of hip.  Will obtain MRI of pelvis return to the office afterwards  Follow-Up Instructions: No follow-ups on file.   Orders:  Orders Placed This Encounter  Procedures  . XR HIP UNILAT W OR W/O PELVIS 2-3 VIEWS LEFT   No orders of the defined types were placed in this encounter.     Procedures: No procedures performed   Clinical Data: No additional findings.   Subjective: Chief Complaint  Patient presents with  . Follow-up    L HIP PAIN FOR FEW MO, NO INJURY WAS INJECTED 1 MO AGO HELPED AT FIRST BUT NOT NOW  Shatina was seen a month ago for evaluation of lateral left hip pain.  Clinically her symptoms were consistent with greater trochanteric bursitis.  Cortisone injection was performed with significant relief up until about a week ago when she has had recurrent pain without injury or trauma.  Pain does not appear to be related to her back.  She is had some chronic problems with her lumbar spine has been followed by Dr. Ellene Route.  Not been experiencing numbness or tingling or groin or anterior thigh pain.  HPI  Review of Systems  Constitutional: Negative for fatigue and fever.  HENT: Negative for ear pain.   Eyes: Negative for pain.  Respiratory: Negative for cough and shortness of breath.   Cardiovascular: Negative for leg swelling.  Gastrointestinal: Negative for constipation and diarrhea.  Genitourinary: Negative for difficulty urinating.  Musculoskeletal: Negative for back pain and neck pain.  Skin: Negative for  rash.  Allergic/Immunologic: Negative for food allergies.  Neurological: Negative for weakness and numbness.  Hematological: Bruises/bleeds easily.  Psychiatric/Behavioral: Positive for sleep disturbance.     Objective: Vital Signs: BP 135/63 (BP Location: Left Arm, Patient Position: Sitting, Cuff Size: Normal)   Pulse 79   Ht 5' 3.75" (1.619 m)   Wt 145 lb (65.8 kg)   LMP 07/29/1990   BMI 25.08 kg/m   Physical Exam  Constitutional: She is oriented to person, place, and time. She appears well-developed and well-nourished.  HENT:  Mouth/Throat: Oropharynx is clear and moist.  Eyes: Pupils are equal, round, and reactive to light. EOM are normal.  Pulmonary/Chest: Effort normal.  Neurological: She is alert and oriented to person, place, and time.  Skin: Skin is warm and dry.  Psychiatric: She has a normal mood and affect. Her behavior is normal.    Ortho Exam week alert and oriented x3.  Comfortable sitting.  Some pain over the greater trochanter of the left hip with internal/external rotation.  Did not have any specific groin or anterior thigh pain.  Straight leg raise negative.  Some pain directly over the greater trochanter.  No particular loss of motion of the left compared to her right hip.  No distal edema.  Neurovascular exam intact Specialty Comments:  No specialty comments available.  Imaging: No results found.   [place]  History: Patient Active Problem List   Diagnosis Date Noted  . Lichen sclerosus et atrophicus 06/14/2016  . Hypothyroidism 06/14/2016  . Hypertension   . Hyperlipidemia   . TIA (transient ischemic attack) 06/12/2012  . HTN (hypertension) 05/20/2011   Past Medical History:  Diagnosis Date  . Alopecia   . Arthritis   . DJD (degenerative joint disease), cervical 5/02   Elsner  . HSV-1 infection   . Hyperlipidemia   . Hypertension   . Lichen sclerosus 24/23/5361   biopsy proven  . Liver hemangioma 2011   Dr. Earlean Shawl  . Thyroiditis   .  Transient global amnesia    not fully diag    Family History  Problem Relation Age of Onset  . Stroke Mother   . Cancer Father   . Colon cancer Father        84's    Past Surgical History:  Procedure Laterality Date  . APPENDECTOMY  1956  . CARDIOVASCULAR STRESS TEST  10/29/2006   EF 78%.   Marland Kitchen LOOP RECORDER INSERTION N/A 05/01/2017   Procedure: LOOP RECORDER INSERTION;  Surgeon: Evans Lance, MD;  Location: [facility];  Service: Cardiovascular;  Laterality: N/A;  . ROTATOR CUFF REPAIR Bilateral   . SHOULDER SURGERY    . TONSILLECTOMY AND ADENOIDECTOMY  1957  . TOTAL ABDOMINAL HYSTERECTOMY W/ BILATERAL SALPINGOOPHORECTOMY  7/90  . US ECHOCARDIOGRAPHY  10/11/2009   EF 55-60%   Social History   Occupational History  . Not on file  Tobacco Use  . Smoking status: Former Smoker    Years: 10.00    Types: Cigarettes    Last attempt to quit: 01/01/1970    Years since quitting: 48.1  . Smokeless tobacco: Never Used  Substance and Sexual Activity  . Alcohol use: Yes    Comment: 1-2 glasses of wine   . Drug use: No  . Sexual activity: Yes    Partners: Male    Comment: hysterectomy

## 2018-02-05 ENCOUNTER — Other Ambulatory Visit (INDEPENDENT_AMBULATORY_CARE_PROVIDER_SITE_OTHER): Payer: Self-pay | Admitting: Radiology

## 2018-02-05 DIAGNOSIS — M25552 Pain in left hip: Secondary | ICD-10-CM

## 2018-02-06 ENCOUNTER — Ambulatory Visit (INDEPENDENT_AMBULATORY_CARE_PROVIDER_SITE_OTHER): Payer: Medicare Other | Admitting: *Deleted

## 2018-02-06 DIAGNOSIS — R55 Syncope and collapse: Secondary | ICD-10-CM

## 2018-02-07 ENCOUNTER — Ambulatory Visit
Admission: RE | Admit: 2018-02-07 | Discharge: 2018-02-07 | Disposition: A | Discharge status: Home / Self Care | Payer: Medicare Other | Source: Ambulatory Visit | Attending: Orthopaedic Surgery | Admitting: Orthopaedic Surgery

## 2018-02-07 DIAGNOSIS — S76312A Strain of muscle, fascia and tendon of the posterior muscle group at thigh level, left thigh, initial encounter: Secondary | ICD-10-CM | POA: Diagnosis not present

## 2018-02-07 DIAGNOSIS — M25552 Pain in left hip: Secondary | ICD-10-CM

## 2018-02-10 ENCOUNTER — Encounter (INDEPENDENT_AMBULATORY_CARE_PROVIDER_SITE_OTHER): Payer: Self-pay | Admitting: Orthopaedic Surgery

## 2018-02-10 ENCOUNTER — Other Ambulatory Visit (INDEPENDENT_AMBULATORY_CARE_PROVIDER_SITE_OTHER): Payer: Self-pay | Admitting: Radiology

## 2018-02-10 ENCOUNTER — Ambulatory Visit (INDEPENDENT_AMBULATORY_CARE_PROVIDER_SITE_OTHER): Payer: Medicare Other | Admitting: Orthopaedic Surgery

## 2018-02-10 VITALS — BP 121/56 | HR 78 | Ht 63.75 in | Wt 145.0 lb

## 2018-02-10 DIAGNOSIS — M25552 Pain in left hip: Secondary | ICD-10-CM

## 2018-02-10 DIAGNOSIS — D1801 Hemangioma of skin and subcutaneous tissue: Secondary | ICD-10-CM | POA: Diagnosis not present

## 2018-02-10 DIAGNOSIS — L821 Other seborrheic keratosis: Secondary | ICD-10-CM | POA: Diagnosis not present

## 2018-02-10 DIAGNOSIS — Z85828 Personal history of other malignant neoplasm of skin: Secondary | ICD-10-CM | POA: Diagnosis not present

## 2018-02-10 DIAGNOSIS — L570 Actinic keratosis: Secondary | ICD-10-CM | POA: Diagnosis not present

## 2018-02-10 NOTE — Progress Notes (Signed)
Office Visit Note   Patient: Sabrina Parks           Date of Birth: Mar 06, 1941           MRN: [id number] Visit Date: 02/10/2018              Requested by: Marton Redwood, MD [address] [place], [place] [address] PCP: Marton Redwood, MD   Assessment & Plan: Visit Diagnoses:  1. Pain of left hip joint     Plan: MRI scan of pelvis demonstrates some osteoarthritic changes in both the left and the right hip there also is spinal stenosis at L4-5 which is been a chronic issue.  She sees Dr. Ellene Route for occasional dural steroid injection there is also a partial tear the left gluteus minimus muscle.  Think the pain she is experiencing along the lateral aspect of her left hip originates from her hip joint.  She has not had good response to cortisone injection over the greater trochanter last Dr. Ernestina Patches to inject the left hip for both diagnostic and therapeutic purposes  Follow-Up Instructions: Return in about 1 month (around 03/13/2018).   Orders:  No orders of the defined types were placed in this encounter.  No orders of the defined types were placed in this encounter.     Procedures: No procedures performed   Clinical Data: No additional findings.   Subjective: Chief Complaint  Patient presents with  . Follow-up    MRI REVIEW PELVIS  Sabrina Parks is been experiencing lateral left hip pain for she does have a chronic problem with her back with spinal stenosis and degenerative changes at L4-5.  She does see Dr. Ellene Route.  She had an injection in January that made a big difference in terms of her back and leg pain.  He has, however, developed some pain directly over the lateral aspect of her left hip.  Cortisone injection did not provide much relief.  Accordingly I ordered the MRI scan there are degenerative changes of both hips left greater than right  HPI  Review of Systems   Objective: Vital Signs: BP (!) 121/56 (BP Location: Left Arm, Patient Position: Sitting, Cuff Size: Normal)    Pulse 78   Ht 5' 3.75" (1.619 m)   Wt 145 lb (65.8 kg)   LMP 07/29/1990   BMI 25.08 kg/m   Physical Exam  Constitutional: She is oriented to person, place, and time. She appears well-developed and well-nourished.  HENT:  Mouth/Throat: Oropharynx is clear and moist.  Eyes: Pupils are equal, round, and reactive to light. EOM are normal.  Pulmonary/Chest: Effort normal.  Neurological: She is alert and oriented to person, place, and time.  Skin: Skin is warm and dry.  Psychiatric: She has a normal mood and affect. Her behavior is normal.    Ortho Exam awake alert and oriented x3.  Comfortable sitting.  Some pain with internal and particularly external rotation of her left hip with pain referred to the lateral trochanteric region.  No thigh pain no knee pain.  Straight leg raise negative Specialty Comments:  No specialty comments available.  Imaging: No results found.   PMFS History: Patient Active Problem List   Diagnosis Date Noted  . Lichen sclerosus et atrophicus 06/14/2016  . Hypothyroidism 06/14/2016  . Hypertension   . Hyperlipidemia   . TIA (transient ischemic attack) 06/12/2012  . HTN (hypertension) 05/20/2011   Past Medical History:  Diagnosis Date  . Alopecia   . Arthritis   . DJD (degenerative joint  disease), cervical 5/02   Elsner  . HSV-1 infection   . Hyperlipidemia   . Hypertension   . Lichen sclerosus 44/92/0100   biopsy proven  . Liver hemangioma 2011   Dr. Earlean Shawl  . Thyroiditis   . Transient global amnesia    not fully diag    Family History  Problem Relation Age of Onset  . Stroke Mother   . Cancer Father   . Colon cancer Father        48's    Past Surgical History:  Procedure Laterality Date  . APPENDECTOMY  1956  . CARDIOVASCULAR STRESS TEST  10/29/2006   EF 78%.   Marland Kitchen LOOP RECORDER INSERTION N/A 05/01/2017   Procedure: LOOP RECORDER INSERTION;  Surgeon: Evans Lance, MD;  Location: [facility];  Service: Cardiovascular;   Laterality: N/A;  . ROTATOR CUFF REPAIR Bilateral   . SHOULDER SURGERY    . TONSILLECTOMY AND ADENOIDECTOMY  1957  . TOTAL ABDOMINAL HYSTERECTOMY W/ BILATERAL SALPINGOOPHORECTOMY  7/90  . US ECHOCARDIOGRAPHY  10/11/2009   EF 55-60%   Social History   Occupational History  . Not on file  Tobacco Use  . Smoking status: Former Smoker    Years: 10.00    Types: Cigarettes    Last attempt to quit: 01/01/1970    Years since quitting: 48.1  . Smokeless tobacco: Never Used  Substance and Sexual Activity  . Alcohol use: Yes    Comment: 1-2 glasses of wine   . Drug use: No  . Sexual activity: Yes    Partners: Male    Comment: hysterectomy     Garald Balding, MD   Note - This record has been created using Editor, commissioning.  Chart creation errors have been sought, but may not always  have been located. Such creation errors do not reflect on  the standard of medical care.

## 2018-02-11 ENCOUNTER — Telehealth: Payer: Self-pay

## 2018-02-11 DIAGNOSIS — R55 Syncope and collapse: Secondary | ICD-10-CM | POA: Diagnosis not present

## 2018-02-11 NOTE — Telephone Encounter (Signed)
Pt felt like something was going on with her device last night. She felt overall bad. I let her talk to the device clinic tech nurse.

## 2018-02-11 NOTE — Progress Notes (Signed)
Carelink Summary Report / Loop Recorder 

## 2018-02-13 LAB — CUP PACEART REMOTE DEVICE CHECK
Date Time Interrogation Session: 20190613203644
Implantable Pulse Generator Implant Date: 20181004

## 2018-02-27 ENCOUNTER — Encounter

## 2018-02-27 ENCOUNTER — Ambulatory Visit (INDEPENDENT_AMBULATORY_CARE_PROVIDER_SITE_OTHER): Payer: Medicare Other | Admitting: Physical Medicine and Rehabilitation

## 2018-02-27 ENCOUNTER — Ambulatory Visit (INDEPENDENT_AMBULATORY_CARE_PROVIDER_SITE_OTHER): Payer: Self-pay

## 2018-02-27 DIAGNOSIS — M25552 Pain in left hip: Secondary | ICD-10-CM | POA: Diagnosis not present

## 2018-02-27 NOTE — Patient Instructions (Signed)

## 2018-02-27 NOTE — Progress Notes (Signed)
Numeric Pain Rating Scale and Functional Assessment Average Pain 6   In the last MONTH (on 0-10 scale) has pain interfered with the following?  1. General activity like being  able to carry out your everyday physical activities such as walking, climbing stairs, carrying groceries, or moving a chair?  Rating(3)    -BT, -Dye Allergies.

## 2018-02-27 NOTE — Progress Notes (Addendum)
Sabrina Parks - 77 y.o. female MRN [id number]  Date of birth: 03/31/1941  Office Visit Note: Visit Date: 02/27/2018 PCP: Marton Redwood, MD Referred by: Marton Redwood, MD  Subjective: Chief Complaint  Patient presents with  . Left Hip - Pain   HPI: Sabrina Parks is a 77 year old female that I have actually seen in the remote past for her back.  She is followed by Dr. Joni Fears who has been managing her left hip pain which is mostly posterior lateral hip pain worse with laying on that side.  He is completed greater trochanteric injections without much relief.  MRI of the pelvis shows degenerative changes of both hips as well as stenosis of the lumbar spine at L4-5.  She is followed by Dr. Ellene Route who provides injections for her spine.  We are going to complete a diagnostic note for therapeutic anesthetic hip arthrogram on the right.  Depending on relief would asked Dr. Durward Fortes to consider fluoroscopic guided greater trochanteric injection at least on one occasion to see if that helped.   ROS Otherwise per HPI.  Assessment & Plan: Visit Diagnoses:  1. Pain in left hip     Plan: Findings:  Diagnostic and hopefully therapeutic anesthetic left  hip arthrogram.  Patient seem to get some relief with movement during the anesthetic phase but it was hard for her to tell.    Meds & Orders: No orders of the defined types were placed in this encounter.   Orders Placed This Encounter  Procedures  . Large Joint Inj: L hip joint  . XR C-ARM NO REPORT    Follow-up: Return if symptoms worsen or fail to improve.   Procedures: Large Joint Inj: L hip joint on 02/27/2018 10:40 AM Indications: pain and diagnostic evaluation Details: 22 G needle, anterior approach  Arthrogram: Yes  Medications: 80 mg triamcinolone acetonide 40 MG/ML; 3 mL bupivacaine 0.5 % Outcome: tolerated well, no immediate complications  Arthrogram demonstrated excellent flow of contrast throughout the joint surface  without extravasation or obvious defect.  The patient had relief of symptoms during the anesthetic phase of the injection.  Procedure, treatment alternatives, risks and benefits explained, specific risks discussed. Consent was given by the patient. Immediately prior to procedure a time out was called to verify the correct patient, procedure, equipment, support staff and site/side marked as required. Patient was prepped and draped in the usual sterile fashion.      No notes on file   Clinical History: No specialty comments available.   She reports that she quit smoking about 48 years ago. Her smoking use included cigarettes. She quit after 10.00 years of use. She has never used smokeless tobacco. No results for input(s): HGBA1C, LABURIC in the last 8760 hours.  Objective:  VS:  HT:    WT:   BMI:     BP:   HR: bpm  TEMP: ( )  RESP:  Physical Exam  Ortho Exam Imaging: No results found.  Past Medical/Family/Surgical/Social History: Medications & Allergies reviewed per EMR, new medications updated. Patient Active Problem List   Diagnosis Date Noted  . Lichen sclerosus et atrophicus 06/14/2016  . Hypothyroidism 06/14/2016  . Hypertension   . Hyperlipidemia   . TIA (transient ischemic attack) 06/12/2012  . HTN (hypertension) 05/20/2011   Past Medical History:  Diagnosis Date  . Alopecia   . Arthritis   . DJD (degenerative joint disease), cervical 5/02   Elsner  . HSV-1 infection   . Hyperlipidemia   .  Hypertension   . Lichen sclerosus 47/18/5501   biopsy proven  . Liver hemangioma 2011   Dr. Earlean Shawl  . Thyroiditis   . Transient global amnesia    not fully diag   Family History  Problem Relation Age of Onset  . Stroke Mother   . Cancer Father   . Colon cancer Father        44's   Past Surgical History:  Procedure Laterality Date  . APPENDECTOMY  1956  . CARDIOVASCULAR STRESS TEST  10/29/2006   EF 78%.   Marland Kitchen LOOP RECORDER INSERTION N/A 05/01/2017   Procedure: LOOP  RECORDER INSERTION;  Surgeon: Evans Lance, MD;  Location: [facility];  Service: Cardiovascular;  Laterality: N/A;  . ROTATOR CUFF REPAIR Bilateral   . SHOULDER SURGERY    . TONSILLECTOMY AND ADENOIDECTOMY  1957  . TOTAL ABDOMINAL HYSTERECTOMY W/ BILATERAL SALPINGOOPHORECTOMY  7/90  . US ECHOCARDIOGRAPHY  10/11/2009   EF 55-60%   Social History   Occupational History  . Not on file  Tobacco Use  . Smoking status: Former Smoker    Years: 10.00    Types: Cigarettes    Last attempt to quit: 01/01/1970    Years since quitting: 48.2  . Smokeless tobacco: Never Used  Substance and Sexual Activity  . Alcohol use: Yes    Comment: 1-2 glasses of wine   . Drug use: No  . Sexual activity: Yes    Partners: Male    Comment: hysterectomy

## 2018-03-10 DIAGNOSIS — M5116 Intervertebral disc disorders with radiculopathy, lumbar region: Secondary | ICD-10-CM | POA: Diagnosis not present

## 2018-03-10 DIAGNOSIS — M5416 Radiculopathy, lumbar region: Secondary | ICD-10-CM | POA: Diagnosis not present

## 2018-03-16 ENCOUNTER — Ambulatory Visit (INDEPENDENT_AMBULATORY_CARE_PROVIDER_SITE_OTHER): Payer: Medicare Other | Admitting: *Deleted

## 2018-03-16 DIAGNOSIS — R55 Syncope and collapse: Secondary | ICD-10-CM

## 2018-03-17 NOTE — Progress Notes (Signed)
Carelink Summary Report / Loop Recorder 

## 2018-03-18 MED ORDER — TRIAMCINOLONE ACETONIDE 40 MG/ML IJ SUSP
80.0000 mg | INTRAMUSCULAR | Status: AC | PRN
  Administered 2018-02-27: 80 mg via INTRA_ARTICULAR

## 2018-03-18 MED ORDER — BUPIVACAINE HCL 0.5 % IJ SOLN
3.0000 mL | INTRAMUSCULAR | Status: AC | PRN
  Administered 2018-02-27: 3 mL via INTRA_ARTICULAR

## 2018-03-19 LAB — CUP PACEART REMOTE DEVICE CHECK
Date Time Interrogation Session: 20190716203551
Implantable Pulse Generator Implant Date: 20181004

## 2018-04-17 ENCOUNTER — Ambulatory Visit (INDEPENDENT_AMBULATORY_CARE_PROVIDER_SITE_OTHER): Payer: Medicare Other | Admitting: *Deleted

## 2018-04-17 DIAGNOSIS — R55 Syncope and collapse: Secondary | ICD-10-CM

## 2018-04-18 NOTE — Progress Notes (Signed)
Carelink Summary Report / Loop Recorder 

## 2018-04-20 LAB — CUP PACEART REMOTE DEVICE CHECK
Date Time Interrogation Session: 20190818213903
Implantable Pulse Generator Implant Date: 20181004

## 2018-04-27 DIAGNOSIS — H04123 Dry eye syndrome of bilateral lacrimal glands: Secondary | ICD-10-CM | POA: Diagnosis not present

## 2018-04-27 DIAGNOSIS — H43813 Vitreous degeneration, bilateral: Secondary | ICD-10-CM | POA: Diagnosis not present

## 2018-04-27 DIAGNOSIS — H5211 Myopia, right eye: Secondary | ICD-10-CM | POA: Diagnosis not present

## 2018-04-27 DIAGNOSIS — H26493 Other secondary cataract, bilateral: Secondary | ICD-10-CM | POA: Diagnosis not present

## 2018-04-27 LAB — CUP PACEART REMOTE DEVICE CHECK
Date Time Interrogation Session: 20190920214153
Implantable Pulse Generator Implant Date: 20181004

## 2018-05-02 DIAGNOSIS — Z23 Encounter for immunization: Secondary | ICD-10-CM | POA: Diagnosis not present

## 2018-05-18 ENCOUNTER — Telehealth: Payer: Self-pay | Admitting: Obstetrics & Gynecology

## 2018-05-18 ENCOUNTER — Telehealth (INDEPENDENT_AMBULATORY_CARE_PROVIDER_SITE_OTHER): Payer: Self-pay | Admitting: Physical Medicine and Rehabilitation

## 2018-05-18 NOTE — Telephone Encounter (Signed)
ok 

## 2018-05-18 NOTE — Telephone Encounter (Signed)
Patient calling to schedule appointment for lichen sclerosus and knot under breast.

## 2018-05-18 NOTE — Telephone Encounter (Signed)
Office visit with Dr. Sabra Heck tomorrow at [phone] scheduled.  Most recent mammogram with [facility] requested.  Encounter closed.

## 2018-05-19 ENCOUNTER — Ambulatory Visit (INDEPENDENT_AMBULATORY_CARE_PROVIDER_SITE_OTHER): Payer: Medicare Other | Admitting: Obstetrics & Gynecology

## 2018-05-19 ENCOUNTER — Other Ambulatory Visit: Payer: Self-pay

## 2018-05-19 ENCOUNTER — Encounter: Payer: Self-pay | Admitting: Obstetrics & Gynecology

## 2018-05-19 VITALS — BP 138/70 | HR 84 | Resp 16 | Ht 63.75 in | Wt 149.0 lb

## 2018-05-19 DIAGNOSIS — R222 Localized swelling, mass and lump, trunk: Secondary | ICD-10-CM

## 2018-05-19 DIAGNOSIS — L900 Lichen sclerosus et atrophicus: Secondary | ICD-10-CM

## 2018-05-19 NOTE — Progress Notes (Signed)
GYNECOLOGY  VISIT  CC:   New breast lump two days ago  HPI: 77 y.o. G80P2002 Married White or Caucasian female here for lateral right mass that she noted two days ago.  It is not in her breast, she thinks, but lateral to the right breast.  It is a little tender.  Denies recent trauma.  Had MMG 12/18/17.  Has Grade A breast density and this was as 3D.  Reports she has a sore place on the vulva.  Has biopsy proven sclerosus.really not feeling any itching.  Wondering about a small nodular area she is feeling.  Denies vaginal bleeding or discharge.  GYNECOLOGIC HISTORY: Patient's last menstrual period was 07/29/1990. Contraception: hysterectomy  Menopausal hormone therapy: none  Patient Active Problem List   Diagnosis Date Noted  . 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 Lichen sclerosus et atrophicus 06/14/2016  . Hypothyroidism 06/14/2016  . Hypertension   . Hyperlipidemia   . TIA (transient ischemic attack) 06/12/2012  . Alopecia 08/01/2011    Past Medical History:  Diagnosis Date  . Alopecia   . Arthritis   . DJD (degenerative joint disease), cervical 5/02   Elsner  . HSV-1 infection   . Hyperlipidemia   . Hypertension   . Lichen sclerosus 17/51/0258   biopsy proven  . Liver hemangioma 2011   Dr. Earlean Shawl  . Thyroiditis   . Transient global amnesia    not fully diag    Past Surgical History:  Procedure Laterality Date  . APPENDECTOMY  1956  . CARDIOVASCULAR STRESS TEST  10/29/2006   EF 78%.   Marland Kitchen LOOP RECORDER INSERTION N/A 05/01/2017   Procedure: LOOP RECORDER INSERTION;  Surgeon: Evans Lance, MD;  Location: [facility];  Service: Cardiovascular;  Laterality: N/A;  . ROTATOR CUFF REPAIR Bilateral   . SHOULDER SURGERY    . TONSILLECTOMY AND ADENOIDECTOMY  1957  . TOTAL ABDOMINAL HYSTERECTOMY W/ BILATERAL SALPINGOOPHORECTOMY  7/90  . US ECHOCARDIOGRAPHY  10/11/2009   EF 55-60%    MEDS:   Current Outpatient Medications on File Prior to Visit   Medication Sig Dispense Refill  . acetaminophen (TYLENOL) 500 MG tablet Take 500 mg by mouth every 6 (six) hours as needed for moderate pain or headache.    Marland Kitchen amitriptyline (ELAVIL) 25 MG tablet Take 25 mg by mouth at bedtime.     Marland Kitchen aspirin 81 MG tablet Take 81 mg by mouth daily.    Marland Kitchen BIOTIN PO Take 1 tablet by mouth daily.     . Cholecalciferol (VITAMIN D-3 PO) Take 1 tablet by mouth daily.    . clobetasol ointment (TEMOVATE) 5.27 % Apply 1 application topically 2 (two) times daily. Apply as directed twice daily.  Use for up to 7 days. 60 g 0  . diclofenac sodium (VOLTAREN) 1 % GEL Apply 2-4 g topically 4 (four) times daily. 5 Tube 1  . ibuprofen (ADVIL,MOTRIN) 200 MG tablet Take 200 mg by mouth every 6 (six) hours as needed for headache or moderate pain.     Marland Kitchen levothyroxine (SYNTHROID, LEVOTHROID) 50 MCG tablet Take 50 mcg by mouth daily before breakfast.    . LORazepam (ATIVAN) 0.5 MG tablet Take 0.5 mg by mouth daily as needed for anxiety.     Marland Kitchen olmesartan (BENICAR) 40 MG tablet Take 40 mg by mouth daily.     Marland Kitchen PROAIR HFA 108 (90 BASE) MCG/ACT inhaler Inhale 2 puffs into the lungs every 6 (six) hours as needed for wheezing  or shortness of breath.     . ranitidine (ZANTAC) 150 MG tablet Take 150 mg by mouth daily.    . rosuvastatin (CRESTOR) 10 MG tablet Take 10 mg by mouth daily.    Marland Kitchen triamcinolone (NASACORT ALLERGY 24HR) 55 MCG/ACT AERO nasal inhaler Place 1 spray into the nose daily as needed (for allergies).     No current facility-administered medications on file prior to visit.     ALLERGIES: Codeine; Morphine; Morphine and related; and Sulfa antibiotics  Family History  Problem Relation Age of Onset  . Stroke Mother   . Cancer Father   . Colon cancer Father        59's    SH:  Married, non smoker  Review of Systems  HENT: Positive for hearing loss.   Genitourinary: Positive for dyspareunia.  All other systems reviewed and are negative.   PHYSICAL EXAMINATION:    BP  138/70 (BP Location: Left Arm, Patient Position: Sitting, Cuff Size: Normal)   Pulse 84   Resp 16   Ht 5' 3.75" (1.619 m)   Wt 149 lb (67.6 kg)   LMP 07/29/1990   BMI 25.78 kg/m     Physical Exam  Constitutional: She appears well-developed and well-nourished.  Neck: Normal range of motion. No thyromegaly present.  Respiratory: Right breast exhibits no inverted nipple, no mass, no nipple discharge, no skin change and no tenderness. Left breast exhibits no inverted nipple, no mass, no nipple discharge, no skin change and no tenderness.    Genitourinary:    No labial fusion. There is lesion on the right labia. There is no rash, tenderness or injury on the right labia. There is no rash, tenderness, lesion or injury on the left labia.  Lymphadenopathy:    She has no cervical adenopathy.    Chaperone was present for exam.  Assessment: Possible right lateral to the breast lymph node vs thickened tissue from bra or change in tissue quality due to gravity Two small sebaceous cysts  Plan: Will see if Teola Bradley will do a right lateral breast ultrasound Reassured about vulvar findings

## 2018-05-19 NOTE — Telephone Encounter (Signed)
Patient reports right lateral hip pain. She has not seen Dr. Ernestina Patches for right sided pain. She will schedule an appointment with Dr. Durward Fortes to have this evaluated.

## 2018-05-19 NOTE — Telephone Encounter (Signed)
Left message with patient's husband for patient to call back to schedule.

## 2018-05-20 ENCOUNTER — Ambulatory Visit (INDEPENDENT_AMBULATORY_CARE_PROVIDER_SITE_OTHER): Payer: Medicare Other | Admitting: *Deleted

## 2018-05-20 DIAGNOSIS — R55 Syncope and collapse: Secondary | ICD-10-CM | POA: Diagnosis not present

## 2018-05-21 NOTE — Progress Notes (Signed)
Carelink Summary Report / Loop Recorder 

## 2018-05-28 ENCOUNTER — Encounter (INDEPENDENT_AMBULATORY_CARE_PROVIDER_SITE_OTHER): Payer: Self-pay | Admitting: Orthopaedic Surgery

## 2018-05-28 ENCOUNTER — Ambulatory Visit (INDEPENDENT_AMBULATORY_CARE_PROVIDER_SITE_OTHER): Payer: Medicare Other | Admitting: Orthopaedic Surgery

## 2018-05-28 VITALS — BP 130/66 | HR 95 | Ht 63.75 in | Wt 143.0 lb

## 2018-05-28 DIAGNOSIS — M7061 Trochanteric bursitis, right hip: Secondary | ICD-10-CM | POA: Diagnosis not present

## 2018-05-28 MED ORDER — DICLOFENAC SODIUM 1 % TD GEL: 2.0000 g | Freq: Four times a day (QID) | TRANSDERMAL | 3 refills | Status: DC

## 2018-05-28 MED ORDER — METHYLPREDNISOLONE ACETATE 40 MG/ML IJ SUSP
80.0000 mg | INTRAMUSCULAR | Status: AC | PRN
  Administered 2018-05-28: 80 mg

## 2018-05-28 MED ORDER — BUPIVACAINE HCL 0.5 % IJ SOLN
2.0000 mL | INTRAMUSCULAR | Status: AC | PRN
  Administered 2018-05-28: 2 mL via INTRA_ARTICULAR

## 2018-05-28 MED ORDER — LIDOCAINE HCL 1 % IJ SOLN
2.0000 mL | INTRAMUSCULAR | Status: AC | PRN
  Administered 2018-05-28: 2 mL

## 2018-05-28 NOTE — Progress Notes (Signed)
Office Visit Note   Patient: Sabrina Parks           Date of Birth: 1941-05-06           MRN: [id number] Visit Date: 05/28/2018              Requested by: Marton Redwood, MD [address] [place], [place] [address] PCP: Marton Redwood, MD   Assessment & Plan: Visit Diagnoses:  1. Trochanteric bursitis, right hip     Plan:  #1: Corticosteroid injection to the right trochanteric region was given with excellent results #2: Voltaren gel prescription was written for her  Follow-Up Instructions: Return if symptoms worsen or fail to improve.   Orders:  No orders of the defined types were placed in this encounter.  Meds ordered this encounter  Medications  . diclofenac sodium (VOLTAREN) 1 % GEL    Sig: Apply 2-4 g topically 4 (four) times daily.    Dispense:  5 Tube    Refill:  3    Order Specific Question:   Supervising Provider    Answer:   Garald Balding [[id number]]      Procedures: Large Joint Inj: R greater trochanter on 05/28/2018 10:18 AM Details: 3.5 in lateral approach Medications: 2 mL lidocaine 1 %; 2 mL bupivacaine 0.5 %; 80 mg methylPREDNISolone acetate 40 MG/ML Outcome: tolerated well, no immediate complications Procedure, treatment alternatives, risks and benefits explained, specific risks discussed. Consent was given by the patient. Immediately prior to procedure a time out was called to verify the correct patient, procedure, equipment, support staff and site/side marked as required. Patient was prepped and draped in the usual sterile fashion.       Clinical Data: No additional findings.   Subjective: Chief Complaint  Patient presents with  . Follow-up    R HIP PAIN WOULD LIKE AN INJECTION. PT FELL AT [facility] 8-10 YRS AGO HAD PAIN OFF AND ON SINCE    HPI  Sabrina Parks is a very pleasant 77 year old white female who presents today with pain overlying the right lateral hip.  She states that she has no groin pain or back pain at this time.  Nothing radicular in  nature.  History is though she fell about 8 to 10 years ago at the [facility] has had pain off and on in the area of the hip.  Denies any neurovascular compromise.  Review of Systems  Constitutional: Negative for fatigue and fever.  HENT: Negative for ear pain.   Eyes: Negative for pain.  Respiratory: Negative for cough and shortness of breath.   Cardiovascular: Negative for leg swelling.  Gastrointestinal: Negative for constipation and diarrhea.  Genitourinary: Negative for difficulty urinating.  Musculoskeletal: Negative for back pain and neck pain.  Skin: Negative for rash.  Allergic/Immunologic: Negative for food allergies.  Neurological: Negative for weakness and numbness.  Hematological: Bruises/bleeds easily.  Psychiatric/Behavioral: Positive for sleep disturbance.     Objective: Vital Signs: BP 130/66 (BP Location: Left Arm, Patient Position: Sitting, Cuff Size: Normal)   Pulse 95   Ht 5' 3.75" (1.619 m)   Wt 143 lb (64.9 kg)   LMP 07/29/1990   BMI 24.74 kg/m   Physical Exam  Constitutional: She is oriented to person, place, and time. She appears well-developed and well-nourished.  HENT:  Mouth/Throat: Oropharynx is clear and moist.  Eyes: Pupils are equal, round, and reactive to light. EOM are normal.  Pulmonary/Chest: Effort normal.  Neurological: She is alert and oriented to person, place, and time.  Skin: Skin is warm and dry.  Psychiatric: She has a normal mood and affect. Her behavior is normal.    Ortho Exam  Exam today reveals good motion of the right hip without pain or discomfort.  She is exquisitely tender over the trochanteric bursal area and trochanter.  No warmth or erythema.  No ecchymosis is noted.  Specialty Comments:  No specialty comments available.  Imaging: No results found.   PMFS History: Current Outpatient Medications  Medication Sig Dispense Refill  . acetaminophen (TYLENOL) 500 MG tablet Take 500 mg by mouth every 6 (six) hours as needed  for moderate pain or headache.    Marland Kitchen amitriptyline (ELAVIL) 25 MG tablet Take 25 mg by mouth at bedtime.     Marland Kitchen aspirin 81 MG tablet Take 81 mg by mouth daily.    Marland Kitchen BIOTIN PO Take 1 tablet by mouth daily.     . Cholecalciferol (VITAMIN D-3 PO) Take 1 tablet by mouth daily.    . clobetasol ointment (TEMOVATE) 2.37 % Apply 1 application topically 2 (two) times daily. Apply as directed twice daily.  Use for up to 7 days. 60 g 0  . diclofenac sodium (VOLTAREN) 1 % GEL Apply 2-4 g topically 4 (four) times daily. 5 Tube 1  . ibuprofen (ADVIL,MOTRIN) 200 MG tablet Take 200 mg by mouth every 6 (six) hours as needed for headache or moderate pain.     Marland Kitchen levothyroxine (SYNTHROID, LEVOTHROID) 50 MCG tablet Take 50 mcg by mouth daily before breakfast.    . LORazepam (ATIVAN) 0.5 MG tablet Take 0.5 mg by mouth daily as needed for anxiety.     Marland Kitchen olmesartan (BENICAR) 40 MG tablet Take 40 mg by mouth daily.     Marland Kitchen PROAIR HFA 108 (90 BASE) MCG/ACT inhaler Inhale 2 puffs into the lungs every 6 (six) hours as needed for wheezing or shortness of breath.     . ranitidine (ZANTAC) 150 MG tablet Take 150 mg by mouth daily.    . rosuvastatin (CRESTOR) 10 MG tablet Take 10 mg by mouth daily.    Marland Kitchen triamcinolone (NASACORT ALLERGY 24HR) 55 MCG/ACT AERO nasal inhaler Place 1 spray into the nose daily as needed (for allergies).    . diclofenac sodium (VOLTAREN) 1 % GEL Apply 2-4 g topically 4 (four) times daily. 5 Tube 3   No current facility-administered medications for this visit.     Patient Active Problem List   Diagnosis Date Noted  . 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 Lichen sclerosus et atrophicus 06/14/2016  . Hypothyroidism 06/14/2016  . Hypertension   . Hyperlipidemia   . TIA (transient ischemic attack) 06/12/2012  . Alopecia 08/01/2011   Past Medical History:  Diagnosis Date  . Alopecia   . Arthritis   . DJD (degenerative joint disease), cervical 5/02   Elsner  . HSV-1 infection   .  Hyperlipidemia   . Hypertension   . Lichen sclerosus 62/83/1517   biopsy proven  . Liver hemangioma 2011   Dr. Earlean Shawl  . Thyroiditis   . Transient global amnesia    not fully diag    Family History  Problem Relation Age of Onset  . Stroke Mother   . Cancer Father   . Colon cancer Father        52's    Past Surgical History:  Procedure Laterality Date  . APPENDECTOMY  1956  . CARDIOVASCULAR STRESS TEST  10/29/2006   EF 78%.   Marland Kitchen LOOP RECORDER  INSERTION N/A 05/01/2017   Procedure: LOOP RECORDER INSERTION;  Surgeon: Evans Lance, MD;  Location: [facility];  Service: Cardiovascular;  Laterality: N/A;  . ROTATOR CUFF REPAIR Bilateral   . SHOULDER SURGERY    . TONSILLECTOMY AND ADENOIDECTOMY  1957  . TOTAL ABDOMINAL HYSTERECTOMY W/ BILATERAL SALPINGOOPHORECTOMY  7/90  . US ECHOCARDIOGRAPHY  10/11/2009   EF 55-60%   Social History   Occupational History  . Not on file  Tobacco Use  . Smoking status: Former Smoker    Years: 10.00    Types: Cigarettes    Last attempt to quit: 01/01/1970    Years since quitting: 48.4  . Smokeless tobacco: Never Used  Substance and Sexual Activity  . Alcohol use: Yes    Comment: 1-2 glasses of wine   . Drug use: No  . Sexual activity: Yes    Partners: Male    Comment: hysterectomy

## 2018-06-02 DIAGNOSIS — N649 Disorder of breast, unspecified: Secondary | ICD-10-CM | POA: Diagnosis not present

## 2018-06-10 DIAGNOSIS — Z6826 Body mass index (BMI) 26.0-26.9, adult: Secondary | ICD-10-CM | POA: Diagnosis not present

## 2018-06-10 DIAGNOSIS — K118 Other diseases of salivary glands: Secondary | ICD-10-CM | POA: Diagnosis not present

## 2018-06-10 DIAGNOSIS — I10 Essential (primary) hypertension: Secondary | ICD-10-CM | POA: Diagnosis not present

## 2018-06-10 DIAGNOSIS — R209 Unspecified disturbances of skin sensation: Secondary | ICD-10-CM | POA: Diagnosis not present

## 2018-06-11 LAB — CUP PACEART REMOTE DEVICE CHECK
Date Time Interrogation Session: 20191023213620
Implantable Pulse Generator Implant Date: 20181004

## 2018-06-16 ENCOUNTER — Encounter: Payer: Self-pay | Admitting: Obstetrics & Gynecology

## 2018-06-22 ENCOUNTER — Ambulatory Visit (INDEPENDENT_AMBULATORY_CARE_PROVIDER_SITE_OTHER): Payer: Medicare Other

## 2018-06-22 DIAGNOSIS — R55 Syncope and collapse: Secondary | ICD-10-CM

## 2018-06-23 NOTE — Progress Notes (Signed)
Carelink Summary Report / Loop Recorder 

## 2018-07-15 ENCOUNTER — Telehealth: Payer: Self-pay | Admitting: *Deleted

## 2018-07-15 DIAGNOSIS — R42 Dizziness and giddiness: Secondary | ICD-10-CM | POA: Diagnosis not present

## 2018-07-15 DIAGNOSIS — Z6826 Body mass index (BMI) 26.0-26.9, adult: Secondary | ICD-10-CM | POA: Diagnosis not present

## 2018-07-15 DIAGNOSIS — R7301 Impaired fasting glucose: Secondary | ICD-10-CM | POA: Diagnosis not present

## 2018-07-15 DIAGNOSIS — I10 Essential (primary) hypertension: Secondary | ICD-10-CM | POA: Diagnosis not present

## 2018-07-15 NOTE — Telephone Encounter (Signed)
Spoke with patient regarding tachy episode noted on LINQ from 07/13/18 at 0234, duration 7sec. Patient denies symptoms with this episode, but states she has noted dizzy spells as recently as 07/06/18. Advised patient this is the only auto-detected episode since implant. Encouraged patient to use symptom activator for dizzy spells and requested that she send a manual transmission and make [place] aware of these episodes so that we can review the ECGs. Patient verbalizes understanding and agreement with plan. She denies additional questions or concerns at this time.  ECG printed and placed in Dr. Macon Large folder for review.

## 2018-07-17 ENCOUNTER — Telehealth: Payer: Self-pay | Admitting: Cardiology

## 2018-07-17 NOTE — Telephone Encounter (Signed)
Spoke to patient regarding her ILR remote transmission. Presenting rhythm: SR @ 98bpm. (2) "symptom" episodes recorded - SR @ ~104bpm. Patient states that she noticed her HR was slightly elevated when she took her BP. Patient states that she a little ShOB, but denies any other symptoms. I informed patient that her HR was slightly above normal at the time, and no arrhythmias were noted. Patient verbalized understanding, but still expressed concern regarding her BP and HR. Spoke to Lavina, [place] about patient's concerns. Tommye Standard, PA recommended no changes at this time. Called patient and informed her of Renee's recommendation. Patient verbalized understanding and appreciation of information.   Will forward information to Lorenda Hatchet as patient is overdue f/u with Dr.Taylor.

## 2018-07-17 NOTE — Telephone Encounter (Signed)
Patient called and stated that last night she had heart rate >100 bpm according to her blood pressure monitor. Her blood pressure was around 168/95 to140/73. Patient stated that she was a little short of breath, no dizziness, no chest pain. Patient stated that she did use her symptom activator. Transmission received from 07-16-18. Informed pt that I would send message to the nurse and someone will call her back after the review the information. Pt verbalized understanding.

## 2018-07-27 ENCOUNTER — Ambulatory Visit (INDEPENDENT_AMBULATORY_CARE_PROVIDER_SITE_OTHER): Payer: Medicare Other

## 2018-07-27 DIAGNOSIS — R55 Syncope and collapse: Secondary | ICD-10-CM

## 2018-07-27 NOTE — Progress Notes (Signed)
Carelink Summary Report / Loop Recorder 

## 2018-07-28 LAB — CUP PACEART REMOTE DEVICE CHECK
Date Time Interrogation Session: 20191228231123
Implantable Pulse Generator Implant Date: 20181004

## 2018-08-04 DIAGNOSIS — I10 Essential (primary) hypertension: Secondary | ICD-10-CM | POA: Diagnosis not present

## 2018-08-04 DIAGNOSIS — R600 Localized edema: Secondary | ICD-10-CM | POA: Diagnosis not present

## 2018-08-04 DIAGNOSIS — Z6826 Body mass index (BMI) 26.0-26.9, adult: Secondary | ICD-10-CM | POA: Diagnosis not present

## 2018-08-05 ENCOUNTER — Ambulatory Visit (HOSPITAL_COMMUNITY)
Admission: RE | Admit: 2018-08-05 | Discharge: 2018-08-05 | Disposition: A | Discharge status: Home / Self Care | Payer: Medicare Other | Source: Ambulatory Visit | Attending: Family | Admitting: Family

## 2018-08-05 ENCOUNTER — Other Ambulatory Visit (HOSPITAL_COMMUNITY): Payer: Self-pay | Admitting: Internal Medicine

## 2018-08-05 DIAGNOSIS — R609 Edema, unspecified: Secondary | ICD-10-CM | POA: Diagnosis not present

## 2018-08-09 LAB — CUP PACEART REMOTE DEVICE CHECK
Date Time Interrogation Session: 20191125224102
Implantable Pulse Generator Implant Date: 20181004

## 2018-08-18 DIAGNOSIS — L821 Other seborrheic keratosis: Secondary | ICD-10-CM | POA: Diagnosis not present

## 2018-08-18 DIAGNOSIS — Z85828 Personal history of other malignant neoplasm of skin: Secondary | ICD-10-CM | POA: Diagnosis not present

## 2018-08-18 DIAGNOSIS — L603 Nail dystrophy: Secondary | ICD-10-CM | POA: Diagnosis not present

## 2018-08-18 DIAGNOSIS — L570 Actinic keratosis: Secondary | ICD-10-CM | POA: Diagnosis not present

## 2018-08-18 DIAGNOSIS — D1801 Hemangioma of skin and subcutaneous tissue: Secondary | ICD-10-CM | POA: Diagnosis not present

## 2018-08-18 DIAGNOSIS — D485 Neoplasm of uncertain behavior of skin: Secondary | ICD-10-CM | POA: Diagnosis not present

## 2018-08-18 DIAGNOSIS — C44519 Basal cell carcinoma of skin of other part of trunk: Secondary | ICD-10-CM | POA: Diagnosis not present

## 2018-08-18 DIAGNOSIS — D692 Other nonthrombocytopenic purpura: Secondary | ICD-10-CM | POA: Diagnosis not present

## 2018-08-26 DIAGNOSIS — K449 Diaphragmatic hernia without obstruction or gangrene: Secondary | ICD-10-CM | POA: Diagnosis not present

## 2018-08-26 DIAGNOSIS — K5730 Diverticulosis of large intestine without perforation or abscess without bleeding: Secondary | ICD-10-CM | POA: Diagnosis not present

## 2018-08-26 DIAGNOSIS — K219 Gastro-esophageal reflux disease without esophagitis: Secondary | ICD-10-CM | POA: Diagnosis not present

## 2018-08-26 DIAGNOSIS — Z800 Family history of malignant neoplasm of digestive organs: Secondary | ICD-10-CM | POA: Diagnosis not present

## 2018-08-26 DIAGNOSIS — D123 Benign neoplasm of transverse colon: Secondary | ICD-10-CM | POA: Diagnosis not present

## 2018-08-26 DIAGNOSIS — Z86010 Personal history of colonic polyps: Secondary | ICD-10-CM | POA: Diagnosis not present

## 2018-08-27 ENCOUNTER — Ambulatory Visit (INDEPENDENT_AMBULATORY_CARE_PROVIDER_SITE_OTHER): Payer: Medicare Other

## 2018-08-27 DIAGNOSIS — R55 Syncope and collapse: Secondary | ICD-10-CM | POA: Diagnosis not present

## 2018-08-28 DIAGNOSIS — D123 Benign neoplasm of transverse colon: Secondary | ICD-10-CM | POA: Diagnosis not present

## 2018-08-28 LAB — CUP PACEART REMOTE DEVICE CHECK
Date Time Interrogation Session: 20200130233743
Implantable Pulse Generator Implant Date: 20181004

## 2018-09-01 ENCOUNTER — Other Ambulatory Visit: Payer: Self-pay | Admitting: Internal Medicine

## 2018-09-03 DIAGNOSIS — R600 Localized edema: Secondary | ICD-10-CM | POA: Diagnosis not present

## 2018-09-03 DIAGNOSIS — Z6825 Body mass index (BMI) 25.0-25.9, adult: Secondary | ICD-10-CM | POA: Diagnosis not present

## 2018-09-03 DIAGNOSIS — I10 Essential (primary) hypertension: Secondary | ICD-10-CM | POA: Diagnosis not present

## 2018-09-04 NOTE — Progress Notes (Signed)
Carelink Summary Report / Loop Recorder 

## 2018-09-24 ENCOUNTER — Telehealth: Payer: Self-pay | Admitting: *Deleted

## 2018-09-24 ENCOUNTER — Other Ambulatory Visit: Payer: Self-pay

## 2018-09-24 ENCOUNTER — Encounter: Payer: Self-pay | Admitting: Obstetrics & Gynecology

## 2018-09-24 ENCOUNTER — Ambulatory Visit (INDEPENDENT_AMBULATORY_CARE_PROVIDER_SITE_OTHER): Payer: Medicare Other | Admitting: Obstetrics & Gynecology

## 2018-09-24 VITALS — BP 138/60 | HR 96 | Temp 98.5°F | Resp 16 | Ht 63.75 in | Wt 149.4 lb

## 2018-09-24 DIAGNOSIS — N9089 Other specified noninflammatory disorders of vulva and perineum: Secondary | ICD-10-CM

## 2018-09-24 DIAGNOSIS — R3915 Urgency of urination: Secondary | ICD-10-CM | POA: Diagnosis not present

## 2018-09-24 LAB — POCT URINALYSIS DIPSTICK
Bilirubin, UA: NEGATIVE
Blood, UA: NEGATIVE
Glucose, UA: NEGATIVE
Ketones, UA: NEGATIVE
Nitrite, UA: NEGATIVE
Protein, UA: NEGATIVE
Urobilinogen, UA: 0.2 E.U./dL
pH, UA: 6 (ref 5.0–8.0)

## 2018-09-24 MED ORDER — NYSTATIN 100000 UNIT/GM EX CREA: 1.0000 "application " | TOPICAL_CREAM | Freq: Two times a day (BID) | CUTANEOUS | 0 refills | Status: DC

## 2018-09-24 NOTE — Telephone Encounter (Signed)
Spoke with patient. Patient reports vaginal irritation and discomfort when sitting. Urine burns skin when urinating, symptoms started 2/26. Denies urinary symptoms, vaginal d/c, odor, itching or bleeding. Requesting OV today. OV scheduled with Dr. Sabra Heck for today at 3:15pm. Patient verbalizes understanding and is agreeable.   Encounter closed.

## 2018-09-24 NOTE — Progress Notes (Signed)
GYNECOLOGY  VISIT  CC:   Perineal pain, dysuria    HPI: 78 y.o. G73P2002 Married White or Caucasian female here for vulvar irritation and stinging with voiding that has been present for two days.  This does not feel like a UTI to her as she doesn't really have burning with urination until the urine hits the vulvar skin and then there is a burning sensation.  To her, this feels more like a skin issue.  Denies vaginal bleeding or discharge.  Denies itching.  She has experienced some mild urinary urgency as well.  GYNECOLOGIC HISTORY: Patient's last menstrual period was 07/29/1990. Contraception: Hysterectomy  Menopausal hormone therapy: none  Patient Active Problem List   Diagnosis Date Noted  . 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 Lichen sclerosus et atrophicus 06/14/2016  . Hypothyroidism 06/14/2016  . Hypertension   . Hyperlipidemia   . TIA (transient ischemic attack) 06/12/2012  . Alopecia 08/01/2011    Past Medical History:  Diagnosis Date  . Alopecia   . Arthritis   . DJD (degenerative joint disease), cervical 5/02   Elsner  . HSV-1 infection   . Hyperlipidemia   . Hypertension   . Lichen sclerosus 20/25/4270   biopsy proven  . Liver hemangioma 2011   Dr. Earlean Shawl  . Thyroiditis   . Transient global amnesia    not fully diag    Past Surgical History:  Procedure Laterality Date  . APPENDECTOMY  1956  . CARDIOVASCULAR STRESS TEST  10/29/2006   EF 78%.   Marland Kitchen LOOP RECORDER INSERTION N/A 05/01/2017   Procedure: LOOP RECORDER INSERTION;  Surgeon: Evans Lance, MD;  Location: [facility];  Service: Cardiovascular;  Laterality: N/A;  . ROTATOR CUFF REPAIR Bilateral   . SHOULDER SURGERY    . TONSILLECTOMY AND ADENOIDECTOMY  1957  . TOTAL ABDOMINAL HYSTERECTOMY W/ BILATERAL SALPINGOOPHORECTOMY  7/90  . US ECHOCARDIOGRAPHY  10/11/2009   EF 55-60%    MEDS:   Current Outpatient Medications on File Prior to Visit  Medication Sig Dispense Refill  .  acetaminophen (TYLENOL) 500 MG tablet Take 500 mg by mouth every 6 (six) hours as needed for moderate pain or headache.    Marland Kitchen amitriptyline (ELAVIL) 25 MG tablet Take 25 mg by mouth at bedtime.     Marland Kitchen aspirin 81 MG tablet Take 81 mg by mouth daily.    Marland Kitchen BIOTIN PO Take 1 tablet by mouth daily.     . Cholecalciferol (VITAMIN D-3 PO) Take 1 tablet by mouth daily.    . clobetasol ointment (TEMOVATE) 6.23 % Apply 1 application topically 2 (two) times daily. Apply as directed twice daily.  Use for up to 7 days. 60 g 0  . diclofenac sodium (VOLTAREN) 1 % GEL Apply 2-4 g topically 4 (four) times daily. 5 Tube 3  . famotidine (PEPCID) 20 MG tablet TAKE 1 TABLET BY MOUTH AT BEDTIME AS NEEDED TWICE A WEEK    . hydrochlorothiazide (HYDRODIURIL) 12.5 MG tablet Take 12.5 mg by mouth daily.    Marland Kitchen ibuprofen (ADVIL,MOTRIN) 200 MG tablet Take 200 mg by mouth every 6 (six) hours as needed for headache or moderate pain.     Marland Kitchen levothyroxine (SYNTHROID, LEVOTHROID) 50 MCG tablet Take 50 mcg by mouth daily before breakfast.    . LORazepam (ATIVAN) 0.5 MG tablet Take 0.5 mg by mouth daily as needed for anxiety.     Marland Kitchen olmesartan (BENICAR) 40 MG tablet Take 40 mg by mouth daily.     Marland Kitchen  ondansetron (ZOFRAN) 4 MG tablet Take 4 mg by mouth every 6 (six) hours as needed. for nausea  1  . PROAIR HFA 108 (90 BASE) MCG/ACT inhaler Inhale 2 puffs into the lungs every 6 (six) hours as needed for wheezing or shortness of breath.     . rosuvastatin (CRESTOR) 10 MG tablet Take 10 mg by mouth daily.    Marland Kitchen triamcinolone (NASACORT ALLERGY 24HR) 55 MCG/ACT AERO nasal inhaler Place 1 spray into the nose daily as needed (for allergies).     No current facility-administered medications on file prior to visit.     ALLERGIES: Codeine; Morphine; Morphine and related; and Sulfa antibiotics  Family History  Problem Relation Age of Onset  . Stroke Mother   . Cancer Father   . Colon cancer Father        69's    SH:  Married, non  smoker  Review of Systems  Genitourinary: Positive for urgency.  All other systems reviewed and are negative.   PHYSICAL EXAMINATION:    BP 138/60 (BP Location: Right Arm, Patient Position: Sitting, Cuff Size: Large)   Pulse 96   Temp 98.5 F (36.9 C) (Oral)   Resp 16   Ht 5' 3.75" (1.619 m)   Wt 149 lb 6.4 oz (67.8 kg)   LMP 07/29/1990   BMI 25.85 kg/m     General appearance: alert, cooperative and appears stated age Lymph:  no inguinal LAD noted  Pelvic: External genitalia:  no lesions but significant erythema of inner labia majora down to the rectum noted              Urethra:  normal appearing urethra with no masses, tenderness or lesions              Bartholins and Skenes: normal                 Vagina: normal appearing vagina with normal color and discharge, no lesions              Cervix: absent              Bimanual Exam:  Uterus:  uterus absent            Chaperone was present for exam.  Assessment: Vulvar irritation/erythema Mild dysuria and urinary urgency  Plan: Affirm pending.  Nystatin cream bid to skin will be started. Urine culture pending.  Results will be called to pt.

## 2018-09-24 NOTE — Telephone Encounter (Signed)
Patient is experiencing some having vaginal irritation.

## 2018-09-25 ENCOUNTER — Telehealth: Payer: Self-pay | Admitting: *Deleted

## 2018-09-25 ENCOUNTER — Encounter: Payer: Self-pay | Admitting: Obstetrics & Gynecology

## 2018-09-25 ENCOUNTER — Ambulatory Visit (INDEPENDENT_AMBULATORY_CARE_PROVIDER_SITE_OTHER): Payer: Medicare Other | Admitting: Obstetrics & Gynecology

## 2018-09-25 VITALS — BP 138/60 | HR 80 | Resp 16 | Ht 63.75 in | Wt 149.0 lb

## 2018-09-25 DIAGNOSIS — N8111 Cystocele, midline: Secondary | ICD-10-CM

## 2018-09-25 LAB — VAGINITIS/VAGINOSIS, DNA PROBE
Candida Species: NEGATIVE
Gardnerella vaginalis: NEGATIVE
Trichomonas vaginosis: NEGATIVE

## 2018-09-25 NOTE — Progress Notes (Signed)
GYNECOLOGY  VISIT  CC:   Follow up   HPI: 78 y.o. G49P2002 Married White or Caucasian female here for evaluation of a vaginal bulge that she noted today.  She was applying the nystatin cream today with a mirror and she saw the bulge.  This was new.  She has been experiencing some increased urine dribbling over the past few months after she finishes voiding.  Denies pressure or pain.  Was concerned this was a hernia present and felt she needed to come in.    She was seen yesterday for vulvar irritation and vulvar erythema.  Vaginal and vulvar swab was obtained that did not show yeast or BV.  She thinks the irritation is improved.  She's only used the cream twice.    GYNECOLOGIC HISTORY: Patient's last menstrual period was 07/29/1990. Contraception: Hysterectomy  Menopausal hormone therapy: none  Patient Active Problem List   Diagnosis Date Noted  . 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 Lichen sclerosus et atrophicus 06/14/2016  . Hypothyroidism 06/14/2016  . Hypertension   . Hyperlipidemia   . TIA (transient ischemic attack) 06/12/2012  . Alopecia 08/01/2011    Past Medical History:  Diagnosis Date  . Alopecia   . Arthritis   . DJD (degenerative joint disease), cervical 5/02   Elsner  . HSV-1 infection   . Hyperlipidemia   . Hypertension   . Lichen sclerosus 00/17/4944   biopsy proven  . Liver hemangioma 2011   Dr. Earlean Shawl  . Thyroiditis   . Transient global amnesia    not fully diag    Past Surgical History:  Procedure Laterality Date  . APPENDECTOMY  1956  . CARDIOVASCULAR STRESS TEST  10/29/2006   EF 78%.   Marland Kitchen LOOP RECORDER INSERTION N/A 05/01/2017   Procedure: LOOP RECORDER INSERTION;  Surgeon: Evans Lance, MD;  Location: [facility];  Service: Cardiovascular;  Laterality: N/A;  . ROTATOR CUFF REPAIR Bilateral   . SHOULDER SURGERY    . TONSILLECTOMY AND ADENOIDECTOMY  1957  . TOTAL ABDOMINAL HYSTERECTOMY W/ BILATERAL SALPINGOOPHORECTOMY  7/90  .  US ECHOCARDIOGRAPHY  10/11/2009   EF 55-60%    MEDS:   Current Outpatient Medications on File Prior to Visit  Medication Sig Dispense Refill  . acetaminophen (TYLENOL) 500 MG tablet Take 500 mg by mouth every 6 (six) hours as needed for moderate pain or headache.    Marland Kitchen amitriptyline (ELAVIL) 25 MG tablet Take 25 mg by mouth at bedtime.     Marland Kitchen aspirin 81 MG tablet Take 81 mg by mouth daily.    Marland Kitchen BIOTIN PO Take 1 tablet by mouth daily.     . Cholecalciferol (VITAMIN D-3 PO) Take 1 tablet by mouth daily.    . clobetasol ointment (TEMOVATE) 9.67 % Apply 1 application topically 2 (two) times daily. Apply as directed twice daily.  Use for up to 7 days. 60 g 0  . diclofenac sodium (VOLTAREN) 1 % GEL Apply 2-4 g topically 4 (four) times daily. 5 Tube 3  . famotidine (PEPCID) 20 MG tablet TAKE 1 TABLET BY MOUTH AT BEDTIME AS NEEDED TWICE A WEEK    . hydrochlorothiazide (HYDRODIURIL) 12.5 MG tablet Take 12.5 mg by mouth daily.    Marland Kitchen ibuprofen (ADVIL,MOTRIN) 200 MG tablet Take 200 mg by mouth every 6 (six) hours as needed for headache or moderate pain.     Marland Kitchen levothyroxine (SYNTHROID, LEVOTHROID) 50 MCG tablet Take 50 mcg by mouth daily before breakfast.    .  LORazepam (ATIVAN) 0.5 MG tablet Take 0.5 mg by mouth daily as needed for anxiety.     Marland Kitchen nystatin cream (MYCOSTATIN) Apply 1 application topically 2 (two) times daily. Apply to affected area BID for up to 7 days. 30 g 0  . olmesartan (BENICAR) 40 MG tablet Take 40 mg by mouth daily.     . ondansetron (ZOFRAN) 4 MG tablet Take 4 mg by mouth every 6 (six) hours as needed. for nausea  1  . PROAIR HFA 108 (90 BASE) MCG/ACT inhaler Inhale 2 puffs into the lungs every 6 (six) hours as needed for wheezing or shortness of breath.     . rosuvastatin (CRESTOR) 10 MG tablet Take 10 mg by mouth daily.    Marland Kitchen triamcinolone (NASACORT ALLERGY 24HR) 55 MCG/ACT AERO nasal inhaler Place 1 spray into the nose daily as needed (for allergies).     No current  facility-administered medications on file prior to visit.     ALLERGIES: Codeine; Morphine; Morphine and related; and Sulfa antibiotics  Family History  Problem Relation Age of Onset  . Stroke Mother   . Cancer Father   . Colon cancer Father        77's    SH:  Married, non smoker  Review of Systems  All other systems reviewed and are negative.   PHYSICAL EXAMINATION:    BP 138/60 (BP Location: Left Arm, Patient Position: Sitting, Cuff Size: Large)   Pulse 80   Resp 16   Ht 5' 3.75" (1.619 m)   Wt 149 lb (67.6 kg)   LMP 07/29/1990   BMI 25.78 kg/m     General appearance: alert, cooperative and appears stated age Lymph:  no inguinal LAD noted  Pelvic: External genitalia:  no lesions, erythema that was present yesterday is much improved              Urethra:  normal appearing urethra with no masses, tenderness or lesions              Bartholins and Skenes: normal                 Vagina: vaginal atrophy without lesions, no masses, 3rd degree cystocele with valsalva noted              Cervix: absent              Bimanual Exam:  Uterus:  uterus absent              Adnexa: no mass, fullness, tenderness  Chaperone was present for exam.  Assessment: Cystocele Vu;var irritation that is improved  Plan: Referral to Dr. Matilde Sprang for surgical repair.  She is not interested in a pessary and I do not think pelvic PT will help much.

## 2018-09-25 NOTE — Telephone Encounter (Signed)
Patient would like to speak with nurse about something she found after her visit on yesterday.

## 2018-09-25 NOTE — Telephone Encounter (Signed)
Pt calling with concerns regarding area that is white "protruding" from vagina.  Wants Dr. Sabra Heck to look at it.  Office visit today with Dr. Sabra Heck at 1245. Encounter closed.

## 2018-09-26 LAB — URINE CULTURE

## 2018-09-28 ENCOUNTER — Telehealth: Payer: Self-pay | Admitting: *Deleted

## 2018-09-28 MED ORDER — TRIAMCINOLONE ACETONIDE 0.5 % EX OINT: 1.0000 "application " | TOPICAL_OINTMENT | Freq: Two times a day (BID) | CUTANEOUS | 0 refills | Status: DC

## 2018-09-28 NOTE — Telephone Encounter (Signed)
LM for pt with normal results and to call back to give [place] an update on her symptoms. Per pt release form.

## 2018-09-28 NOTE — Telephone Encounter (Signed)
Patient states she still has the sore spot that she showed you last week. She states Nystatin does not help.   Please advise.

## 2018-09-28 NOTE — Telephone Encounter (Signed)
Rx for triamcinolone ointment 0.5% to be used twice daily sent to pharmacy.  If no better by Wednesday evening or Thursday am (after 48 hours), needs recheck.  Thanks.

## 2018-09-28 NOTE — Telephone Encounter (Signed)
Left voicemail for pt with message from [facility].

## 2018-09-28 NOTE — Telephone Encounter (Signed)
-----   Message from Megan Salon, MD sent at 09/28/2018 12:45 PM EST ----- Please let pt know her urine culture was negative and see if her symptoms are improved (vulvar burning sensation).  Thanks.

## 2018-09-29 ENCOUNTER — Encounter: Payer: Medicare Other | Admitting: Internal Medicine

## 2018-09-29 ENCOUNTER — Ambulatory Visit (INDEPENDENT_AMBULATORY_CARE_PROVIDER_SITE_OTHER): Payer: Medicare Other | Admitting: *Deleted

## 2018-09-29 DIAGNOSIS — R55 Syncope and collapse: Secondary | ICD-10-CM

## 2018-09-30 LAB — CUP PACEART REMOTE DEVICE CHECK
Date Time Interrogation Session: 20200304003700
Implantable Pulse Generator Implant Date: 20181004

## 2018-10-02 ENCOUNTER — Telehealth: Payer: Self-pay | Admitting: Obstetrics & Gynecology

## 2018-10-02 ENCOUNTER — Encounter: Payer: Self-pay | Admitting: Obstetrics & Gynecology

## 2018-10-02 DIAGNOSIS — R82998 Other abnormal findings in urine: Secondary | ICD-10-CM | POA: Diagnosis not present

## 2018-10-02 DIAGNOSIS — E7849 Other hyperlipidemia: Secondary | ICD-10-CM | POA: Diagnosis not present

## 2018-10-02 DIAGNOSIS — R7301 Impaired fasting glucose: Secondary | ICD-10-CM | POA: Diagnosis not present

## 2018-10-02 DIAGNOSIS — N9089 Other specified noninflammatory disorders of vulva and perineum: Secondary | ICD-10-CM

## 2018-10-02 DIAGNOSIS — I10 Essential (primary) hypertension: Secondary | ICD-10-CM | POA: Diagnosis not present

## 2018-10-02 DIAGNOSIS — E041 Nontoxic single thyroid nodule: Secondary | ICD-10-CM | POA: Diagnosis not present

## 2018-10-02 MED ORDER — ESTRADIOL 0.1 MG/GM VA CREA: TOPICAL_CREAM | VAGINAL | 0 refills | Status: DC

## 2018-10-02 NOTE — Telephone Encounter (Signed)
Spoke with patient.  Seen in office on 3/2 switched to triamcinolone 0.5% ointment on 3/3. Patient reports vulvar redness has gone away, still hurts when sitting. Denies any other symptoms. Patient is leaving for the beach on 3/9, will be gone all wk. Patient asking if medication should be changed?  Advised I will review with Dr. Sabra Heck and return call, patient agreeable.

## 2018-10-02 NOTE — Telephone Encounter (Signed)
Patient was told to cal if she was still having problems. She thinks maybe her medication will need to be changed.

## 2018-10-02 NOTE — Telephone Encounter (Signed)
Call reviewed with Dr. Sabra Heck, call returned to patient. Recommended proceeding with vulvar biopsy, may try vaginal estrogen topically in meantime.   Biopsy scheduled for 3/26 at 11:15am with Dr. Sabra Heck, order placed for precert. Patient request to proceed with vaginal estrogen cream, will try estrace cream. Advised will review Rx with Dr. Sabra Heck, confirmed pharmacy on file. Patient verbalizes understanding.   Rx pended for estrace vaginal cream.  Routing to Dr. Sabra Heck to review.   Cc: Magdalene Patricia

## 2018-10-06 ENCOUNTER — Telehealth: Payer: Self-pay | Admitting: Obstetrics & Gynecology

## 2018-10-06 NOTE — Telephone Encounter (Signed)
Call placed to convey benefits for vulva biopsy. 

## 2018-10-07 NOTE — Progress Notes (Signed)
Carelink Summary Report / Loop Recorder 

## 2018-10-12 DIAGNOSIS — M5416 Radiculopathy, lumbar region: Secondary | ICD-10-CM | POA: Diagnosis not present

## 2018-10-12 DIAGNOSIS — M5116 Intervertebral disc disorders with radiculopathy, lumbar region: Secondary | ICD-10-CM | POA: Diagnosis not present

## 2018-10-15 DIAGNOSIS — Z86010 Personal history of colonic polyps: Secondary | ICD-10-CM | POA: Diagnosis not present

## 2018-10-15 DIAGNOSIS — R7301 Impaired fasting glucose: Secondary | ICD-10-CM | POA: Diagnosis not present

## 2018-10-15 DIAGNOSIS — Z1331 Encounter for screening for depression: Secondary | ICD-10-CM | POA: Diagnosis not present

## 2018-10-15 DIAGNOSIS — Z87898 Personal history of other specified conditions: Secondary | ICD-10-CM | POA: Diagnosis not present

## 2018-10-15 DIAGNOSIS — I10 Essential (primary) hypertension: Secondary | ICD-10-CM | POA: Diagnosis not present

## 2018-10-15 DIAGNOSIS — E038 Other specified hypothyroidism: Secondary | ICD-10-CM | POA: Diagnosis not present

## 2018-10-15 DIAGNOSIS — K118 Other diseases of salivary glands: Secondary | ICD-10-CM | POA: Diagnosis not present

## 2018-10-15 DIAGNOSIS — D320 Benign neoplasm of cerebral meninges: Secondary | ICD-10-CM | POA: Diagnosis not present

## 2018-10-15 DIAGNOSIS — N390 Urinary tract infection, site not specified: Secondary | ICD-10-CM | POA: Diagnosis not present

## 2018-10-15 DIAGNOSIS — Z0000 Encounter for general adult medical examination without abnormal findings: Secondary | ICD-10-CM | POA: Diagnosis not present

## 2018-10-15 DIAGNOSIS — E7849 Other hyperlipidemia: Secondary | ICD-10-CM | POA: Diagnosis not present

## 2018-10-15 DIAGNOSIS — Z6825 Body mass index (BMI) 25.0-25.9, adult: Secondary | ICD-10-CM | POA: Diagnosis not present

## 2018-10-19 ENCOUNTER — Telehealth: Payer: Self-pay | Admitting: Internal Medicine

## 2018-10-19 NOTE — Telephone Encounter (Signed)
Called pt  She is doing well Followed by Dr Brigitte Pulse   BP meds adjusted recently  Will postpone yearly f/u to late summer / fall  Note pt also has appt next week for LINQ   It told her this would most likely be rescheduled

## 2018-10-20 NOTE — Telephone Encounter (Signed)
Spoke with patient. Patient states she has been using vaginal estrogen cream as directed, no changes, still experiencing discomfort when sitting. Denies any new symptoms. Request to proceed with vulvar biopsy. Vulvar biopsy scheduled for 3/26 at 1030 with Dr. Sabra Heck.   Routing to provider for final review. Patient is agreeable to disposition. Will close encounter.   Cc: Lerry Liner

## 2018-10-20 NOTE — Telephone Encounter (Signed)
Patient canceled her vulvar biopsy 10/22/18 and would like to reschedule with Dr.Miller.

## 2018-10-20 NOTE — Telephone Encounter (Signed)
Canceled 3/27 appt per Dr. Harrington Challenger Pt aware Postponing pts yearly f/u to summer/fall Will route to cancel pool

## 2018-10-22 ENCOUNTER — Ambulatory Visit (INDEPENDENT_AMBULATORY_CARE_PROVIDER_SITE_OTHER): Payer: Medicare Other | Admitting: Obstetrics & Gynecology

## 2018-10-22 ENCOUNTER — Ambulatory Visit: Payer: Self-pay | Admitting: Obstetrics & Gynecology

## 2018-10-22 ENCOUNTER — Encounter: Payer: Self-pay | Admitting: Obstetrics & Gynecology

## 2018-10-22 ENCOUNTER — Other Ambulatory Visit: Payer: Self-pay

## 2018-10-22 VITALS — BP 122/62 | HR 60 | Temp 97.6°F | Resp 16 | Ht 63.75 in | Wt 147.0 lb

## 2018-10-22 DIAGNOSIS — D239 Other benign neoplasm of skin, unspecified: Secondary | ICD-10-CM | POA: Diagnosis not present

## 2018-10-22 DIAGNOSIS — N9089 Other specified noninflammatory disorders of vulva and perineum: Secondary | ICD-10-CM

## 2018-10-22 NOTE — Progress Notes (Deleted)
GYNECOLOGY  VISIT  CC:   Vulvar irritation  HPI: 78 y.o. G24P2002 Married White or Caucasian female here for vulvar irritation follow up.  GYNECOLOGIC HISTORY: Patient's last menstrual period was 07/29/1990. Contraception: PMP Menopausal hormone therapy: none  Patient Active Problem List   Diagnosis Date Noted  . 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 Lichen sclerosus et atrophicus 06/14/2016  . Hypothyroidism 06/14/2016  . Hypertension   . Hyperlipidemia   . TIA (transient ischemic attack) 06/12/2012  . Alopecia 08/01/2011    Past Medical History:  Diagnosis Date  . Alopecia   . Arthritis   . DJD (degenerative joint disease), cervical 5/02   Elsner  . HSV-1 infection   . Hyperlipidemia   . Hypertension   . Lichen sclerosus 16/96/7893   biopsy proven  . Liver hemangioma 2011   Dr. Earlean Shawl  . Thyroiditis   . Transient global amnesia    not fully diag    Past Surgical History:  Procedure Laterality Date  . APPENDECTOMY  1956  . CARDIOVASCULAR STRESS TEST  10/29/2006   EF 78%.   Marland Kitchen LOOP RECORDER INSERTION N/A 05/01/2017   Procedure: LOOP RECORDER INSERTION;  Surgeon: Evans Lance, MD;  Location: [facility];  Service: Cardiovascular;  Laterality: N/A;  . ROTATOR CUFF REPAIR Bilateral   . SHOULDER SURGERY    . TONSILLECTOMY AND ADENOIDECTOMY  1957  . TOTAL ABDOMINAL HYSTERECTOMY W/ BILATERAL SALPINGOOPHORECTOMY  7/90  . US ECHOCARDIOGRAPHY  10/11/2009   EF 55-60%    MEDS:   Current Outpatient Medications on File Prior to Visit  Medication Sig Dispense Refill  . acetaminophen (TYLENOL) 500 MG tablet Take 500 mg by mouth every 6 (six) hours as needed for moderate pain or headache.    Marland Kitchen amitriptyline (ELAVIL) 25 MG tablet Take 25 mg by mouth at bedtime.     Marland Kitchen aspirin 81 MG tablet Take 81 mg by mouth daily.    Marland Kitchen BIOTIN PO Take 1 tablet by mouth daily.     . Cholecalciferol (VITAMIN D-3 PO) Take 1 tablet by mouth daily.    . clobetasol ointment  (TEMOVATE) 8.10 % Apply 1 application topically 2 (two) times daily. Apply as directed twice daily.  Use for up to 7 days. 60 g 0  . diclofenac sodium (VOLTAREN) 1 % GEL Apply 2-4 g topically 4 (four) times daily. 5 Tube 3  . estradiol (ESTRACE VAGINAL) 0.1 MG/GM vaginal cream Apply pea size amount topically daily. 42.5 g 0  . famotidine (PEPCID) 20 MG tablet TAKE 1 TABLET BY MOUTH AT BEDTIME AS NEEDED TWICE A WEEK    . hydrochlorothiazide (HYDRODIURIL) 12.5 MG tablet Take 12.5 mg by mouth daily.    Marland Kitchen ibuprofen (ADVIL,MOTRIN) 200 MG tablet Take 200 mg by mouth every 6 (six) hours as needed for headache or moderate pain.     Marland Kitchen levothyroxine (SYNTHROID, LEVOTHROID) 50 MCG tablet Take 50 mcg by mouth daily before breakfast.    . LORazepam (ATIVAN) 0.5 MG tablet Take 0.5 mg by mouth daily as needed for anxiety.     Marland Kitchen nystatin cream (MYCOSTATIN) Apply 1 application topically 2 (two) times daily. Apply to affected area BID for up to 7 days. 30 g 0  . olmesartan (BENICAR) 40 MG tablet Take 40 mg by mouth daily.     . ondansetron (ZOFRAN) 4 MG tablet Take 4 mg by mouth every 6 (six) hours as needed. for nausea  1  . PROAIR HFA 108 (90  BASE) MCG/ACT inhaler Inhale 2 puffs into the lungs every 6 (six) hours as needed for wheezing or shortness of breath.     . rosuvastatin (CRESTOR) 10 MG tablet Take 10 mg by mouth daily.    Marland Kitchen triamcinolone (NASACORT ALLERGY 24HR) 55 MCG/ACT AERO nasal inhaler Place 1 spray into the nose daily as needed (for allergies).    . triamcinolone ointment (KENALOG) 0.5 % Apply 1 application topically 2 (two) times daily. 30 g 0   No current facility-administered medications on file prior to visit.     ALLERGIES: Codeine; Morphine; Morphine and related; and Sulfa antibiotics  Family History  Problem Relation Age of Onset  . Stroke Mother   . Cancer Father   . Colon cancer Father        37's    SH:  ***  Review of Systems  All other systems reviewed and are  negative.   PHYSICAL EXAMINATION:    BP 122/62   Pulse 60   Temp 97.6 F (36.4 C) (Oral)   Resp 16   Ht 5' 3.75" (1.619 m)   Wt 147 lb (66.7 kg)   LMP 07/29/1990   BMI 25.43 kg/m     General appearance: alert, cooperative and appears stated age Neck: no adenopathy, supple, symmetrical, trachea midline and thyroid {CHL AMB PHY EX THYROID NORM DEFAULT:[phone]::"normal to inspection and palpation"} CV:  {Exam; heart brief:31539} Lungs:  {pe lungs ob:314451::"clear to auscultation, no wheezes, rales or rhonchi, symmetric air entry"} Breasts: {Exam; breast:13139::"normal appearance, no masses or tenderness"} Abdomen: soft, non-tender; bowel sounds normal; no masses,  no organomegaly Lymph:  no inguinal LAD noted  Pelvic: External genitalia:  no lesions              Urethra:  normal appearing urethra with no masses, tenderness or lesions              Bartholins and Skenes: normal                 Vagina: normal appearing vagina with normal color and discharge, no lesions              Cervix: {CHL AMB PHY EX CERVIX NORM DEFAULT:[phone]::"no lesions"}              Bimanual Exam:  Uterus:  {CHL AMB PHY EX UTERUS NORM DEFAULT:[phone]::"normal size, contour, position, consistency, mobility, non-tender"}              Adnexa: {CHL AMB PHY EX ADNEXA NO MASS [phone]::"no mass, fullness, tenderness"}              Rectovaginal: {yes no:314532}.  Confirms.              Anus:  normal sphincter tone, no lesions  Chaperone was present for exam.  Assessment: ***  Plan: ***   ~{NUMBERS; -10-45 JOINT ROM:10287} minutes spent with patient >50% of time was in face to face discussion of above.

## 2018-10-22 NOTE — Progress Notes (Signed)
78 y.o. Married Caucasian female here  for vulvar biopsy due to persistent vulvar irritation.    Reports she Dr. Lang Snow about two weeks ago and was diagnosed with a UTI.  She was on an antibiotic for about 5-7 days.  She can't remember the name of it.  Right after that, she stopped the topical products.  Then the same symptoms returned.    As I've recommended the topical products that I think should help and they really haven't, I've recommended a repeat vulvar biopsy.    She did have a vulvar biopsy on 05/20/16.  Results showed: Vulva, biopsy, left inferior labia majora - LICHEN SCLEROSUS. - NO DYSPLASIA OR MALIGNANCY IDENTIFIED.  Procedure described to patient.  Informed consent obtained.  All questions answered before proceeding.    Past Medical History:  Diagnosis Date  . Alopecia   . Arthritis   . DJD (degenerative joint disease), cervical 5/02   Elsner  . HSV-1 infection   . Hyperlipidemia   . Hypertension   . Lichen sclerosus 40/98/1191   biopsy proven  . Liver hemangioma 2011   Dr. Earlean Shawl  . Thyroiditis   . Transient global amnesia    not fully diag    Current Outpatient Medications on File Prior to Visit  Medication Sig Dispense Refill  . acetaminophen (TYLENOL) 500 MG tablet Take 500 mg by mouth every 6 (six) hours as needed for moderate pain or headache.    Marland Kitchen amitriptyline (ELAVIL) 25 MG tablet Take 25 mg by mouth at bedtime.     Marland Kitchen aspirin 81 MG tablet Take 81 mg by mouth daily.    Marland Kitchen BIOTIN PO Take 1 tablet by mouth daily.     . Cholecalciferol (VITAMIN D-3 PO) Take 1 tablet by mouth daily.    . clobetasol ointment (TEMOVATE) 4.78 % Apply 1 application topically 2 (two) times daily. Apply as directed twice daily.  Use for up to 7 days. 60 g 0  . diclofenac sodium (VOLTAREN) 1 % GEL Apply 2-4 g topically 4 (four) times daily. 5 Tube 3  . estradiol (ESTRACE VAGINAL) 0.1 MG/GM vaginal cream Apply pea size amount topically daily. 42.5 g 0  . famotidine (PEPCID) 20 MG  tablet TAKE 1 TABLET BY MOUTH AT BEDTIME AS NEEDED TWICE A WEEK    . hydrochlorothiazide (HYDRODIURIL) 12.5 MG tablet Take 12.5 mg by mouth daily.    Marland Kitchen ibuprofen (ADVIL,MOTRIN) 200 MG tablet Take 200 mg by mouth every 6 (six) hours as needed for headache or moderate pain.     Marland Kitchen levothyroxine (SYNTHROID, LEVOTHROID) 50 MCG tablet Take 50 mcg by mouth daily before breakfast.    . LORazepam (ATIVAN) 0.5 MG tablet Take 0.5 mg by mouth daily as needed for anxiety.     Marland Kitchen nystatin cream (MYCOSTATIN) Apply 1 application topically 2 (two) times daily. Apply to affected area BID for up to 7 days. 30 g 0  . olmesartan (BENICAR) 40 MG tablet Take 40 mg by mouth daily.     . ondansetron (ZOFRAN) 4 MG tablet Take 4 mg by mouth every 6 (six) hours as needed. for nausea  1  . PROAIR HFA 108 (90 BASE) MCG/ACT inhaler Inhale 2 puffs into the lungs every 6 (six) hours as needed for wheezing or shortness of breath.     . rosuvastatin (CRESTOR) 10 MG tablet Take 10 mg by mouth daily.    Marland Kitchen triamcinolone (NASACORT ALLERGY 24HR) 55 MCG/ACT AERO nasal inhaler Place 1 spray into the  nose daily as needed (for allergies).    . triamcinolone ointment (KENALOG) 0.5 % Apply 1 application topically 2 (two) times daily. 30 g 0   No current facility-administered medications on file prior to visit.    Allergies  Allergen Reactions  . Codeine Nausea Only  . Morphine Hives  . Morphine And Related Hives  . Sulfa Antibiotics Rash   SH:  Married, non smoker  Exam:   BP 122/62   Pulse 60   Temp 97.6 F (36.4 C) (Oral)   Resp 16   Ht 5' 3.75" (1.619 m)   Wt 147 lb (66.7 kg)   LMP 07/29/1990   BMI 25.43 kg/m  General appearance: alert and no distress Lymph:  Inguinal adenopathy: negative  External genitalia:  no lesions and but tissue is erythematous in a kissing pattern in inner labia majora and around the rectum, 3rd degree cystocele noted Pap taken: No.  Procedure:  Area cleansed with Betadine.  Sterile technique  used throughout procedure.  Skin anesthestized with Lidocaine 1% plain; 57mL.   4 punch biopsy used to obtain specimen.  Biopsy grasped with pick-ups and excised with scissors.  Adequate hemostasis obtained with silver nitrate sticks.  Dressing was applied.  Pt tolerated procedure well  Specimen(s) labeled as labia majora left and sent to pathology  Assessment:  Vulvar irritation in pt with prior hx of vulvar lichen sclerosus Vulvar erythema  Plan:  Affirm obtained today Patient will be notified of pathology results via phone and additional recommendations will be made at that time.   Neosporin topically recommended at this time.

## 2018-10-22 NOTE — Progress Notes (Deleted)
78 y.o. Married Caucasian female here  for vulvar biopsy due to persistent vulvar irritation.    Reports she Dr. Lang Snow about two weeks ago and was diagnosed with a UTI.  She was on an antibiotic for about 5-7 days.  She can't remember the name of it.  Right after that, she stopped the topical products.  Then the same symptoms returned.    As I've recommended the topical products that I think should help and they really haven't, I've recommended a repeat vulvar biopsy.    She did have a vulvar biopsy on 05/20/16.  Results showed: Vulva, biopsy, left inferior labia majora - LICHEN SCLEROSUS. - NO DYSPLASIA OR MALIGNANCY IDENTIFIED.  Procedure described to patient.  Informed consent obtained.  All questions answered before proceeding.    Past Medical History:  Diagnosis Date  . Alopecia   . Arthritis   . DJD (degenerative joint disease), cervical 5/02   Elsner  . HSV-1 infection   . Hyperlipidemia   . Hypertension   . Lichen sclerosus 44/07/270   biopsy proven  . Liver hemangioma 2011   Dr. Earlean Shawl  . Thyroiditis   . Transient global amnesia    not fully diag    Current Outpatient Medications on File Prior to Visit  Medication Sig Dispense Refill  . acetaminophen (TYLENOL) 500 MG tablet Take 500 mg by mouth every 6 (six) hours as needed for moderate pain or headache.    Marland Kitchen amitriptyline (ELAVIL) 25 MG tablet Take 25 mg by mouth at bedtime.     Marland Kitchen aspirin 81 MG tablet Take 81 mg by mouth daily.    Marland Kitchen BIOTIN PO Take 1 tablet by mouth daily.     . Cholecalciferol (VITAMIN D-3 PO) Take 1 tablet by mouth daily.    . clobetasol ointment (TEMOVATE) 5.36 % Apply 1 application topically 2 (two) times daily. Apply as directed twice daily.  Use for up to 7 days. 60 g 0  . diclofenac sodium (VOLTAREN) 1 % GEL Apply 2-4 g topically 4 (four) times daily. 5 Tube 3  . estradiol (ESTRACE VAGINAL) 0.1 MG/GM vaginal cream Apply pea size amount topically daily. 42.5 g 0  . famotidine (PEPCID) 20 MG  tablet TAKE 1 TABLET BY MOUTH AT BEDTIME AS NEEDED TWICE A WEEK    . hydrochlorothiazide (HYDRODIURIL) 12.5 MG tablet Take 12.5 mg by mouth daily.    Marland Kitchen ibuprofen (ADVIL,MOTRIN) 200 MG tablet Take 200 mg by mouth every 6 (six) hours as needed for headache or moderate pain.     Marland Kitchen levothyroxine (SYNTHROID, LEVOTHROID) 50 MCG tablet Take 50 mcg by mouth daily before breakfast.    . LORazepam (ATIVAN) 0.5 MG tablet Take 0.5 mg by mouth daily as needed for anxiety.     Marland Kitchen nystatin cream (MYCOSTATIN) Apply 1 application topically 2 (two) times daily. Apply to affected area BID for up to 7 days. 30 g 0  . olmesartan (BENICAR) 40 MG tablet Take 40 mg by mouth daily.     . ondansetron (ZOFRAN) 4 MG tablet Take 4 mg by mouth every 6 (six) hours as needed. for nausea  1  . PROAIR HFA 108 (90 BASE) MCG/ACT inhaler Inhale 2 puffs into the lungs every 6 (six) hours as needed for wheezing or shortness of breath.     . rosuvastatin (CRESTOR) 10 MG tablet Take 10 mg by mouth daily.    Marland Kitchen triamcinolone (NASACORT ALLERGY 24HR) 55 MCG/ACT AERO nasal inhaler Place 1 spray into the  nose daily as needed (for allergies).    . triamcinolone ointment (KENALOG) 0.5 % Apply 1 application topically 2 (two) times daily. 30 g 0   No current facility-administered medications on file prior to visit.    Allergies  Allergen Reactions  . Codeine Nausea Only  . Morphine Hives  . Morphine And Related Hives  . Sulfa Antibiotics Rash   SH:  Married, non smoker  Exam:   BP 122/62   Pulse 60   Temp 97.6 F (36.4 C) (Oral)   Resp 16   Ht 5' 3.75" (1.619 m)   Wt 147 lb (66.7 kg)   LMP 07/29/1990   BMI 25.43 kg/m  General appearance: alert and no distress Lymph:  Inguinal adenopathy: negative  External genitalia:  no lesions and but tissue is erythematous in a kissing pattern in inner labia majora and around the rectum, 3rd degree cystocele noted Pap taken: No.  Procedure:  Area cleansed with Betadine.  Sterile technique  used throughout procedure.  Skin anesthestized with Lidocaine 1% plain; 49mL.   4 punch biopsy used to obtain specimen.  Biopsy grasped with pick-ups and excised with scissors.  Adequate hemostasis obtained with silver nitrate sticks.  Dressing was applied.  Pt tolerated procedure well  Specimen(s) labeled as labia majora left and sent to pathology  Assessment:  Vulvar irritation in pt with prior hx of vulvar lichen sclerosus Vulvar erythema  Plan:  Affirm obtained today Patient will be notified of pathology results via phone and additional recommendations will be made at that time.   Neosporin topically recommended at this time.

## 2018-10-23 ENCOUNTER — Telehealth: Payer: Self-pay | Admitting: Internal Medicine

## 2018-10-23 ENCOUNTER — Ambulatory Visit: Payer: Medicare Other | Admitting: Internal Medicine

## 2018-10-23 LAB — VAGINITIS/VAGINOSIS, DNA PROBE
Candida Species: NEGATIVE
Gardnerella vaginalis: NEGATIVE
Trichomonas vaginosis: NEGATIVE

## 2018-10-23 NOTE — Telephone Encounter (Signed)
Returned call to Pt.    Left addl VM.

## 2018-10-23 NOTE — Telephone Encounter (Signed)
Please return call to patient to screen for upcoming appt.

## 2018-10-23 NOTE — Telephone Encounter (Signed)
New message   [facility] for pt to call back about visit on 03.31.20.

## 2018-10-26 NOTE — Telephone Encounter (Signed)
Pt cancelled

## 2018-10-27 ENCOUNTER — Encounter: Payer: Medicare Other | Admitting: Internal Medicine

## 2018-10-28 DIAGNOSIS — N3941 Urge incontinence: Secondary | ICD-10-CM | POA: Diagnosis not present

## 2018-10-28 DIAGNOSIS — R350 Frequency of micturition: Secondary | ICD-10-CM | POA: Diagnosis not present

## 2018-10-28 DIAGNOSIS — N8111 Cystocele, midline: Secondary | ICD-10-CM | POA: Diagnosis not present

## 2018-11-02 ENCOUNTER — Ambulatory Visit (INDEPENDENT_AMBULATORY_CARE_PROVIDER_SITE_OTHER): Payer: Medicare Other | Admitting: *Deleted

## 2018-11-02 ENCOUNTER — Other Ambulatory Visit: Payer: Self-pay

## 2018-11-02 DIAGNOSIS — R55 Syncope and collapse: Secondary | ICD-10-CM

## 2018-11-02 LAB — CUP PACEART REMOTE DEVICE CHECK
Date Time Interrogation Session: 20200406013614
Implantable Pulse Generator Implant Date: 20181004

## 2018-11-03 DIAGNOSIS — R350 Frequency of micturition: Secondary | ICD-10-CM | POA: Diagnosis not present

## 2018-11-03 DIAGNOSIS — N3941 Urge incontinence: Secondary | ICD-10-CM | POA: Diagnosis not present

## 2018-11-10 DIAGNOSIS — N8111 Cystocele, midline: Secondary | ICD-10-CM | POA: Diagnosis not present

## 2018-11-10 DIAGNOSIS — R350 Frequency of micturition: Secondary | ICD-10-CM | POA: Diagnosis not present

## 2018-11-10 NOTE — Progress Notes (Signed)
Carelink Summary Report / Loop Recorder 

## 2018-12-04 ENCOUNTER — Other Ambulatory Visit: Payer: Self-pay

## 2018-12-04 ENCOUNTER — Ambulatory Visit (INDEPENDENT_AMBULATORY_CARE_PROVIDER_SITE_OTHER): Payer: Medicare Other | Admitting: *Deleted

## 2018-12-04 DIAGNOSIS — R55 Syncope and collapse: Secondary | ICD-10-CM | POA: Diagnosis not present

## 2018-12-06 LAB — CUP PACEART REMOTE DEVICE CHECK
Date Time Interrogation Session: 20200509013551
Implantable Pulse Generator Implant Date: 20181004

## 2018-12-08 NOTE — Progress Notes (Signed)
Carelink Summary Report / Loop Recorder 

## 2018-12-09 ENCOUNTER — Other Ambulatory Visit: Payer: Self-pay | Admitting: Urology

## 2018-12-17 ENCOUNTER — Telehealth: Payer: Self-pay | Admitting: Internal Medicine

## 2018-12-17 NOTE — Telephone Encounter (Signed)
° ° °  STAT if patient feels like he/she is going to faint   1) Are you dizzy now? no  2) Do you feel faint or have you passed out?  Not at this moment, BUT patient reports feeling  faint earlier today  3) Do you have any other symptoms? NO  4) Have you checked your HR and BP (record if available)? 115/65 HR 88

## 2018-12-17 NOTE — Telephone Encounter (Signed)
Spoke with patient. About an hour ago, walking dog down the street - she got familiar feelings like going to pass out.  Really hot.  Came home and checked BP/HR which were normal (see below) and laid down.  Feeling better currently.  Also she is having bladder surgery in June and wants discuss with Dr. Harrington Challenger.  Wishes someone could figure this out.  Would like face to face visit with Dr. Harrington Challenger.  Is available 5/22 and 6/1 to come in. Aware I will forward to Dr. Harrington Challenger to inform and to device clinic nurse to possibly check loop recorder report for today.

## 2018-12-17 NOTE — Telephone Encounter (Signed)
I tried to help the patient to send a manual transmission with her home monitor. She received the error code 3230. I advised her to let the hand help charge in the cradle for 30 minutes and then try again. I told her the device nurse will call her if not today tomorrow. The pt did state she used her symptom activator earlier today. I told her I will call back in the morning to let her know if we got the transmission or not.

## 2018-12-18 ENCOUNTER — Telehealth: Payer: Self-pay | Admitting: Internal Medicine

## 2018-12-18 ENCOUNTER — Encounter: Payer: Self-pay | Admitting: Internal Medicine

## 2018-12-18 ENCOUNTER — Other Ambulatory Visit: Payer: Self-pay

## 2018-12-18 ENCOUNTER — Ambulatory Visit (INDEPENDENT_AMBULATORY_CARE_PROVIDER_SITE_OTHER): Payer: Medicare Other | Admitting: Internal Medicine

## 2018-12-18 VITALS — BP 132/69 | HR 85 | Ht 63.75 in | Wt 145.0 lb

## 2018-12-18 DIAGNOSIS — R55 Syncope and collapse: Secondary | ICD-10-CM

## 2018-12-18 DIAGNOSIS — E785 Hyperlipidemia, unspecified: Secondary | ICD-10-CM

## 2018-12-18 DIAGNOSIS — I447 Left bundle-branch block, unspecified: Secondary | ICD-10-CM | POA: Diagnosis not present

## 2018-12-18 DIAGNOSIS — R42 Dizziness and giddiness: Secondary | ICD-10-CM

## 2018-12-18 DIAGNOSIS — E039 Hypothyroidism, unspecified: Secondary | ICD-10-CM | POA: Diagnosis not present

## 2018-12-18 NOTE — Progress Notes (Signed)
Cardiology Office Note   Date:  12/18/2018   ID:  Sabrina Parks, DOB June 05, 1941, MRN [id number]  PCP:  Marton Redwood, MD  Cardiologist:   Sabrina Carnes, MD   F/U of HTN     History of Present Illness: Sabrina Parks is a 78 y.o. female with a history of HTN, HL and palpitations.  Stress test 2011 negaitve for ischemia.  Echo Nov 2013 mild LVH.  LVEF 65 to 72% with Gr I diastlic dysfunc.  2013  Sabrina Parks.  MRA neg  Carotid USN neg  I saw her in March 2018  LBBB block on EKG new  Myovue normal  The pt called in September  Woke up one day  Felt weired  Walked to BR  Woke up on floor.  Dr Sabrina Parks had previously stopp amlodipne   Seen in clinic by Sabrina Parks  EKG with LBBB   AN ILR implanted    The pt called in today   She says that she had several spells of feeling hot, Parks racing.  BP up then settles down to normal   Yesterday felt different   Lasted hours  Hot when started  Parks never raced  Let like going to pass out  BP fine   Felt weird enough to lay down    Patient says she feels OK today     Current Meds  Medication Sig  . acetaminophen (TYLENOL) 500 MG tablet Take 500 mg by mouth every 6 (six) hours as needed for moderate pain or headache.  Marland Kitchen amitriptyline (ELAVIL) 25 MG tablet Take 25 mg by mouth at bedtime.   Marland Kitchen aspirin EC 81 MG tablet Take 81 mg by mouth at bedtime.  . Biotin 5000 MCG TABS Take 5,000 mcg by mouth daily.  Marland Kitchen BIOTIN PO Take 1 tablet by mouth daily.   . cholecalciferol (VITAMIN D) 25 MCG (1000 UT) tablet Take 1,000 Units by mouth daily.  . Cholecalciferol (VITAMIN D-3 PO) Take 1 tablet by mouth daily.  . clobetasol ointment (TEMOVATE) 6.20 % Apply 1 application topically 2 (two) times daily. Apply as directed twice daily.  Use for up to 7 days. (Patient taking differently: Apply 1 application topically 2 (two) times daily as needed (skin irritation.). )  . famotidine (PEPCID) 10 MG tablet Take 10 mg by mouth daily.  . hydrochlorothiazide (HYDRODIURIL) 12.5 MG  tablet Take 12.5 mg by mouth daily.  Marland Kitchen ibuprofen (ADVIL,MOTRIN) 200 MG tablet Take 200 mg by mouth every 8 (eight) hours as needed for headache or moderate pain.   Marland Kitchen levothyroxine (SYNTHROID, LEVOTHROID) 50 MCG tablet Take 50 mcg by mouth daily before breakfast.  . LORazepam (ATIVAN) 0.5 MG tablet Take 0.5 mg by mouth daily as needed for anxiety.   Marland Kitchen nystatin cream (MYCOSTATIN) Apply 1 application topically 2 (two) times daily. Apply to affected area BID for up to 7 days.  Marland Kitchen olmesartan (BENICAR) 40 MG tablet Take 40 mg by mouth at bedtime.   . ondansetron (ZOFRAN) 4 MG tablet Take 4 mg by mouth every 6 (six) hours as needed for nausea.   Marland Kitchen PROAIR HFA 108 (90 BASE) MCG/ACT inhaler Inhale 2 puffs into the lungs every 6 (six) hours as needed for wheezing or shortness of breath.   . rosuvastatin (CRESTOR) 10 MG tablet Take 10 mg by mouth at bedtime.   . triamcinolone (NASACORT ALLERGY 24HR) 55 MCG/ACT AERO nasal inhaler Place 1 spray into the nose daily as needed (for allergies).  Allergies:   Codeine; Morphine; Morphine and related; and Sulfa antibiotics   Past Medical History:  Diagnosis Date  . Alopecia   . Arthritis   . DJD (degenerative joint disease), cervical 5/02   [facility]  . HSV-1 infection   . Hyperlipidemia   . Hypertension   . Lichen sclerosus 18/84/1660   biopsy proven  . Liver hemangioma 2011   Dr. Earlean Shawl  . Thyroiditis   . Transient global amnesia    not fully diag    Past Surgical History:  Procedure Laterality Date  . APPENDECTOMY  1956  . CARDIOVASCULAR STRESS TEST  10/29/2006   EF 78%.   Marland Kitchen LOOP RECORDER INSERTION N/A 05/01/2017   Procedure: LOOP RECORDER INSERTION;  Surgeon: Evans Lance, MD;  Location: [facility];  Service: Cardiovascular;  Laterality: N/A;  . ROTATOR CUFF REPAIR Bilateral   . SHOULDER SURGERY    . TONSILLECTOMY AND ADENOIDECTOMY  1957  . TOTAL ABDOMINAL HYSTERECTOMY W/ BILATERAL SALPINGOOPHORECTOMY  7/90  . US ECHOCARDIOGRAPHY   10/11/2009   EF 55-60%     Social History:  The patient  reports that she quit smoking about 48 years ago. Her smoking use included cigarettes. She quit after 10.00 years of use. She has never used smokeless tobacco. She reports current alcohol use. She reports that she does not use drugs.   Family History:  The patient's family history includes Cancer in her father; Colon cancer in her father; Stroke in her mother.    ROS:  Please see the history of present illness. All other systems are reviewed and  Negative to the above problem except as noted.    PHYSICAL EXAM: VS:  BP 132/69   Parks 85   Ht 5' 3.75" (1.619 m)   Wt 145 lb (65.8 kg)   LMP 07/29/1990   BMI 25.08 kg/m    Orthostatic check:   122/63  P 80     Sitting  129/68  P 84    Standing   126/64   P 88;  Standing 130/67  P 90  GEN: Well nourished, well developed, in no acute distress  HEENT: normal  Neck: JVP normal  No carotid bruits, or masses Cardiac: RRR; no murmurs, rubs, or gallops,no edema  Respiratory:  clear to auscultation bilaterally, normal work of breathing GI: soft, nontender, nondistended, + BS  No hepatomegaly  MS: no deformity Moving all extremities   Skin: warm and dry, no rash Neuro:  Strength and sensation are intact Psych: euthymic mood, full affect   EKG:  EKG is done   SR   85 bpm   LBBB   PR interval normal   Lipid Panel No results found for: CHOL, TRIG, HDL, CHOLHDL, VLDL, LDLCALC, LDLDIRECT    Wt Readings from Last 3 Encounters:  12/18/18 145 lb (65.8 kg)  10/22/18 147 lb (66.7 kg)  09/25/18 149 lb (67.6 kg)      ASSESSMENT AND PLAN:  1  Dizziness.   The pt's spells sound vagal in nature   Orthostatics are negative    No syncope   Today she feels fine   Loop recorder shows no arrhythmia   No bradycardia  Will get labs today  Told pt to walk with someone, not alone if she is going a distance Encouraged her  Will be in touch with her once I see results    2  Palpitations  Denies     3  LBBB   EKG is unchanged  4  HL  Contniue to follow    5  HTN   BP is OK     F/U based on test results     Current medicines are reviewed at length with the patient today.  The patient does not have concerns regarding medicines.  Signed, Sabrina Carnes, MD  12/18/2018 1:24 PM    [facility] [place], [place], [place]  [address] Phone: [phone]; Fax: [phone]

## 2018-12-18 NOTE — Telephone Encounter (Signed)
Appointment scheduled for 1pm today. Pt is aware. Per Dr. Harrington Challenger pt has not had symptoms of covid or been around anyone with symptoms or positive testing.

## 2018-12-18 NOTE — Telephone Encounter (Signed)
Pt called back stating she still was unable to send the manual transmission. I gave her the number to [place] support to get additional help. Pt also states she waiting for an appointment to be made to see Dr. Harrington Challenger this morning.

## 2018-12-18 NOTE — Telephone Encounter (Signed)
Pt hsa had several episodes of hot, pulse races, BP u then settle downs then normal Yesterday was different   Lasted hours   Hot when started  Pulse never raced BP was fine   Felt weird enough to lie down   Today OK   Will reivew chart

## 2018-12-18 NOTE — Telephone Encounter (Signed)
Symptom episode from 5/21 reviewed - SR. Will route back to [place]/Dr Ross.  Chanetta Marshall, NP 12/18/2018 8:26 AM

## 2018-12-18 NOTE — Telephone Encounter (Signed)
Pt will come in today to be seen Needs orthostatic BP and p   Needs EKG May need labs   Discuss with device section re monitor

## 2018-12-18 NOTE — Patient Instructions (Signed)
Medication Instructions:  No changes If you need a refill on your cardiac medications before your next appointment, please call your pharmacy.   Lab work: Today (bmet, cbc, tsh, lipids, sed rate, cortisol) If you have labs (blood work) drawn today and your tests are completely normal, you will receive your results only by: Marland Kitchen MyChart Message (if you have MyChart) OR . A paper copy in the mail If you have any lab test that is abnormal or we need to change your treatment, we will call you to review the results.  Testing/Procedures: none  Follow-Up:  Any Other Special Instructions Will Be Listed Below (If Applicable).

## 2018-12-18 NOTE — Telephone Encounter (Signed)
I spoke with patient 5/21 and device cma has spoken to patient this morning. See telephone encounter 12/17/18 for more information.

## 2018-12-18 NOTE — Telephone Encounter (Signed)
New Message    Pt is returning a call she got yesterday    Please call back

## 2018-12-19 LAB — BASIC METABOLIC PANEL
BUN/Creatinine Ratio: 20 (ref 12–28)
BUN: 15 mg/dL (ref 8–27)
CO2: 24 mmol/L (ref 20–29)
Calcium: 10.4 mg/dL — ABNORMAL HIGH (ref 8.7–10.3)
Chloride: 90 mmol/L — ABNORMAL LOW (ref 96–106)
Creatinine, Ser: 0.74 mg/dL (ref 0.57–1.00)
GFR calc Af Amer: 90 mL/min/{1.73_m2} (ref >59)
GFR calc non Af Amer: 78 mL/min/{1.73_m2} (ref >59)
Glucose: 91 mg/dL (ref 65–99)
Potassium: 4.3 mmol/L (ref 3.5–5.2)
Sodium: 135 mmol/L (ref 134–144)

## 2018-12-19 LAB — LIPID PANEL
Chol/HDL Ratio: 2.7 ratio (ref 0.0–4.4)
Cholesterol, Total: 199 mg/dL (ref 100–199)
HDL: 74 mg/dL (ref >39)
LDL Calculated: 92 mg/dL (ref 0–99)
Triglycerides: 166 mg/dL — ABNORMAL HIGH (ref 0–149)
VLDL Cholesterol Cal: 33 mg/dL (ref 5–40)

## 2018-12-19 LAB — CBC
Hematocrit: 37.2 % (ref 34.0–46.6)
Hemoglobin: 12.9 g/dL (ref 11.1–15.9)
MCH: 29.5 pg (ref 26.6–33.0)
MCHC: 34.7 g/dL (ref 31.5–35.7)
MCV: 85 fL (ref 79–97)
Platelets: 421 10*3/uL (ref 150–450)
RBC: 4.37 x10E6/uL (ref 3.77–5.28)
RDW: 14.3 % (ref 11.7–15.4)
WBC: 8.1 10*3/uL (ref 3.4–10.8)

## 2018-12-19 LAB — SEDIMENTATION RATE: Sed Rate: 9 mm/hr (ref 0–40)

## 2018-12-19 LAB — TSH: TSH: 1.09 u[IU]/mL (ref 0.450–4.500)

## 2018-12-19 LAB — CORTISOL: Cortisol: 4.5 ug/dL

## 2018-12-24 NOTE — Progress Notes (Signed)
SPOKE W/Pt _     SCREENING SYMPTOMS OF COVID 19:   COUGH--No  RUNNY NOSE--- No  SORE THROAT---No  NASAL CONGESTION----No  SNEEZING----No  SHORTNESS OF BREATH---No  DIFFICULTY BREATHING---No  TEMP >100.0 -----No  UNEXPLAINED BODY ACHES------No  CHILLS --------No   HEADACHES ---------No  LOSS OF SMELL/ TASTE --------No    HAVE YOU OR ANY FAMILY MEMBER TRAVELLED PAST 14 DAYS OUT OF THE   COUNTY---No STATE----No COUNTRY---No-  HAVE YOU OR ANY FAMILY MEMBER BEEN EXPOSED TO ANYONE WITH COVID 19? No    

## 2018-12-24 NOTE — Patient Instructions (Addendum)
Sabrina Parks  12/24/2018   Your procedure is scheduled on: 12-29-18    Report to [facility] Main  Entrance    Report to Short Stay at 5:30 AM   YOU NEED TO HAVE A COVID 19 TEST ON 12-25-18 at 2:15 AM, THIS TEST MUST BE DONE BEFORE SURGERY, COME TO [facility] ENTRANCE BETWEEN THE HOURS OF 900 AM AND 300 PM ON YOUR COVID TEST DATE. ONCE YOU COMPLETED THE COVID -19 TEST, PLEASE BEGIN YOUR QUARANTINE PER YOURHANDOUT INSTRUCTIONS   Call this number if you have problems the morning of surgery [phone]    Remember: Do not eat food or drink liquids :After Midnight.    BRUSH YOUR TEETH MORNING OF SURGERY AND RINSE YOUR MOUTH OUT, NO CHEWING GUM CANDY OR MINTS.     Take these medicines the morning of surgery with A SIP OF WATER: Famotidine (Pepcid), Levothyroxine (Synthroid), and Lorazepam (Ativan), prn                                You may not have any metal on your body including hair pins and              piercings               Do not wear jewelry, make-up, lotions, powders or perfumes, deodorant              Do not wear nail polish.  Do not shave  48 hours prior to surgery.                 Do not bring valuables to the hospital. [place].  Contacts, dentures or bridgework may not be worn into surgery.     Special Instructions: N/A              Please read over the following fact sheets you were given: _____________________________________________________________________             [facility] - Preparing for Surgery Before surgery, you can play an important role.  Because skin is not sterile, your skin needs to be as free of germs as possible.  You can reduce the number of germs on your skin by washing with CHG (chlorahexidine gluconate) soap before surgery.  CHG is an antiseptic cleaner which kills germs and bonds with the skin to continue killing germs even after  washing. Please DO NOT use if you have an allergy to CHG or antibacterial soaps.  If your skin becomes reddened/irritated stop using the CHG and inform your nurse when you arrive at Short Stay. Do not shave (including legs and underarms) for at least 48 hours prior to the first CHG shower.  You may shave your face/neck. Please follow these instructions carefully:  1.  Shower with CHG Soap the night before surgery and the  morning of Surgery.  2.  If you choose to wash your hair, wash your hair first as usual with your  normal  shampoo.  3.  After you shampoo, rinse your hair and body thoroughly to remove the  shampoo.  4.  Use CHG as you would any other liquid soap.  You can apply chg directly  to the skin and wash                       Gently with a scrungie or clean washcloth.  5.  Apply the CHG Soap to your body ONLY FROM THE NECK DOWN.   Do not use on face/ open                           Wound or open sores. Avoid contact with eyes, ears mouth and genitals (private parts).                       Wash face,  Genitals (private parts) with your normal soap.             6.  Wash thoroughly, paying special attention to the area where your surgery  will be performed.  7.  Thoroughly rinse your body with warm water from the neck down.  8.  DO NOT shower/wash with your normal soap after using and rinsing off  the CHG Soap.                9.  Pat yourself dry with a clean towel.            10.  Wear clean pajamas.            11.  Place clean sheets on your bed the night of your first shower and do not  sleep with pets. Day of Surgery : Do not apply any lotions/deodorants the morning of surgery.  Please wear clean clothes to the hospital/surgery center.  FAILURE TO FOLLOW THESE INSTRUCTIONS MAY RESULT IN THE CANCELLATION OF YOUR SURGERY PATIENT SIGNATURE_________________________________  NURSE  SIGNATURE__________________________________  ________________________________________________________________________   Adam Phenix  An incentive spirometer is a tool that can help keep your lungs clear and active. This tool measures how well you are filling your lungs with each breath. Taking long deep breaths may help reverse or decrease the chance of developing breathing (pulmonary) problems (especially infection) following:  A long period of time when you are unable to move or be active. BEFORE THE PROCEDURE   If the spirometer includes an indicator to show your best effort, your nurse or respiratory therapist will set it to a desired goal.  If possible, sit up straight or lean slightly forward. Try not to slouch.  Hold the incentive spirometer in an upright position. INSTRUCTIONS FOR USE  1. Sit on the edge of your bed if possible, or sit up as far as you can in bed or on a chair. 2. Hold the incentive spirometer in an upright position. 3. Breathe out normally. 4. Place the mouthpiece in your mouth and seal your lips tightly around it. 5. Breathe in slowly and as deeply as possible, raising the piston or the ball toward the top of the column. 6. Hold your breath for 3-5 seconds or for as long as possible. Allow the piston or ball to fall to the bottom of the column. 7. Remove the mouthpiece from your mouth and breathe out normally. 8. Rest for a few seconds and repeat Steps 1 through 7 at least 10 times every 1-2 hours when you are awake. Take your time and take a few normal breaths between deep breaths. 9. The spirometer may include an indicator to show  your best effort. Use the indicator as a goal to work toward during each repetition. 10. After each set of 10 deep breaths, practice coughing to be sure your lungs are clear. If you have an incision (the cut made at the time of surgery), support your incision when coughing by placing a pillow or rolled up towels firmly  against it. Once you are able to get out of bed, walk around indoors and cough well. You may stop using the incentive spirometer when instructed by your caregiver.  RISKS AND COMPLICATIONS  Take your time so you do not get dizzy or light-headed.  If you are in pain, you may need to take or ask for pain medication before doing incentive spirometry. It is harder to take a deep breath if you are having pain. AFTER USE  Rest and breathe slowly and easily.  It can be helpful to keep track of a log of your progress. Your caregiver can provide you with a simple table to help with this. If you are using the spirometer at home, follow these instructions: [place] IF:   You are having difficultly using the spirometer.  You have trouble using the spirometer as often as instructed.  Your pain medication is not giving enough relief while using the spirometer.  You develop fever of 100.5 F (38.1 C) or higher. SEEK IMMEDIATE MEDICAL CARE IF:   You cough up bloody sputum that had not been present before.  You develop fever of 102 F (38.9 C) or greater.  You develop worsening pain at or near the incision site. MAKE SURE YOU:   Understand these instructions.  Will watch your condition.  Will get help right away if you are not doing well or get worse. Document Released: 11/25/2006 Document Revised: 10/07/2011 Document Reviewed: 01/26/2007 ExitCare Patient Information 2014 ExitCare, [place].   ________________________________________________________________________  WHAT IS A BLOOD TRANSFUSION? Blood Transfusion Information  A transfusion is the replacement of blood or some of its parts. Blood is made up of multiple cells which provide different functions.  Red blood cells carry oxygen and are used for blood loss replacement.  White blood cells fight against infection.  Platelets control bleeding.  Plasma helps clot blood.  Other blood products are available for  specialized needs, such as hemophilia or other clotting disorders. BEFORE THE TRANSFUSION  Who gives blood for transfusions?   Healthy volunteers who are fully evaluated to make sure their blood is safe. This is blood bank blood. Transfusion therapy is the safest it has ever been in the practice of medicine. Before blood is taken from a donor, a complete history is taken to make sure that person has no history of diseases nor engages in risky social behavior (examples are intravenous drug use or sexual activity with multiple partners). The donor's travel history is screened to minimize risk of transmitting infections, such as malaria. The donated blood is tested for signs of infectious diseases, such as HIV and hepatitis. The blood is then tested to be sure it is compatible with you in order to minimize the chance of a transfusion reaction. If you or a relative donates blood, this is often done in anticipation of surgery and is not appropriate for emergency situations. It takes many days to process the donated blood. RISKS AND COMPLICATIONS Although transfusion therapy is very safe and saves many lives, the main dangers of transfusion include:   Getting an infectious disease.  Developing a transfusion reaction. This is an allergic reaction to  something in the blood you were given. Every precaution is taken to prevent this. The decision to have a blood transfusion has been considered carefully by your caregiver before blood is given. Blood is not given unless the benefits outweigh the risks. AFTER THE TRANSFUSION  Right after receiving a blood transfusion, you will usually feel much better and more energetic. This is especially true if your red blood cells have gotten low (anemic). The transfusion raises the level of the red blood cells which carry oxygen, and this usually causes an energy increase.  The nurse administering the transfusion will monitor you carefully for complications. HOME CARE  INSTRUCTIONS  No special instructions are needed after a transfusion. You may find your energy is better. Speak with your caregiver about any limitations on activity for underlying diseases you may have. SEEK MEDICAL CARE IF:   Your condition is not improving after your transfusion.  You develop redness or irritation at the intravenous (IV) site. SEEK IMMEDIATE MEDICAL CARE IF:  Any of the following symptoms occur over the next 12 hours:  Shaking chills.  You have a temperature by mouth above 102 F (38.9 C), not controlled by medicine.  Chest, back, or muscle pain.  People around you feel you are not acting correctly or are confused.  Shortness of breath or difficulty breathing.  Dizziness and fainting.  You get a rash or develop hives.  You have a decrease in urine output.  Your urine turns a dark color or changes to pink, red, or brown. Any of the following symptoms occur over the next 10 days:  You have a temperature by mouth above 102 F (38.9 C), not controlled by medicine.  Shortness of breath.  Weakness after normal activity.  The white part of the eye turns yellow (jaundice).  You have a decrease in the amount of urine or are urinating less often.  Your urine turns a dark color or changes to pink, red, or brown. Document Released: 07/12/2000 Document Revised: 10/07/2011 Document Reviewed: 02/29/2008 [facility] Patient Information 2014 [place], [place].  _______________________________________________________________________

## 2018-12-24 NOTE — Progress Notes (Signed)
12-18-18 (Epic) EKG and LOV w/Cardiologist

## 2018-12-25 ENCOUNTER — Other Ambulatory Visit (HOSPITAL_COMMUNITY)
Admission: RE | Admit: 2018-12-25 | Discharge: 2018-12-25 | Disposition: A | Discharge status: Home / Self Care | Payer: Medicare Other | Source: Ambulatory Visit | Attending: Urology | Admitting: Urology

## 2018-12-25 ENCOUNTER — Other Ambulatory Visit: Payer: Self-pay

## 2018-12-25 ENCOUNTER — Encounter (HOSPITAL_COMMUNITY)
Admission: RE | Admit: 2018-12-25 | Discharge: 2018-12-25 | Disposition: A | Discharge status: Home / Self Care | Payer: Medicare Other | Source: Ambulatory Visit | Attending: Urology | Admitting: Urology

## 2018-12-25 ENCOUNTER — Encounter (HOSPITAL_COMMUNITY): Payer: Self-pay

## 2018-12-25 DIAGNOSIS — Z1159 Encounter for screening for other viral diseases: Secondary | ICD-10-CM | POA: Insufficient documentation

## 2018-12-25 DIAGNOSIS — Z79890 Hormone replacement therapy (postmenopausal): Secondary | ICD-10-CM | POA: Insufficient documentation

## 2018-12-25 DIAGNOSIS — Z79899 Other long term (current) drug therapy: Secondary | ICD-10-CM | POA: Diagnosis not present

## 2018-12-25 DIAGNOSIS — I447 Left bundle-branch block, unspecified: Secondary | ICD-10-CM | POA: Diagnosis not present

## 2018-12-25 DIAGNOSIS — N8110 Cystocele, unspecified: Secondary | ICD-10-CM | POA: Insufficient documentation

## 2018-12-25 DIAGNOSIS — Z791 Long term (current) use of non-steroidal anti-inflammatories (NSAID): Secondary | ICD-10-CM | POA: Diagnosis not present

## 2018-12-25 DIAGNOSIS — I10 Essential (primary) hypertension: Secondary | ICD-10-CM | POA: Insufficient documentation

## 2018-12-25 DIAGNOSIS — M1990 Unspecified osteoarthritis, unspecified site: Secondary | ICD-10-CM | POA: Insufficient documentation

## 2018-12-25 DIAGNOSIS — Z7951 Long term (current) use of inhaled steroids: Secondary | ICD-10-CM | POA: Diagnosis not present

## 2018-12-25 DIAGNOSIS — Z01812 Encounter for preprocedural laboratory examination: Secondary | ICD-10-CM | POA: Diagnosis not present

## 2018-12-25 DIAGNOSIS — E785 Hyperlipidemia, unspecified: Secondary | ICD-10-CM | POA: Insufficient documentation

## 2018-12-25 DIAGNOSIS — Z7982 Long term (current) use of aspirin: Secondary | ICD-10-CM | POA: Insufficient documentation

## 2018-12-25 DIAGNOSIS — Z87891 Personal history of nicotine dependence: Secondary | ICD-10-CM | POA: Diagnosis not present

## 2018-12-25 HISTORY — DX: Other specified postprocedural states: Z98.890

## 2018-12-25 HISTORY — DX: Other complications of anesthesia, initial encounter: T88.59XA

## 2018-12-25 HISTORY — DX: Nausea with vomiting, unspecified: R11.2

## 2018-12-25 LAB — PROTIME-INR
INR: 1 (ref 0.8–1.2)
Prothrombin Time: 12.7 seconds (ref 11.4–15.2)

## 2018-12-25 LAB — BASIC METABOLIC PANEL
Anion gap: 10 (ref 5–15)
BUN: 13 mg/dL (ref 8–23)
CO2: 24 mmol/L (ref 22–32)
Calcium: 9.7 mg/dL (ref 8.9–10.3)
Chloride: 97 mmol/L — ABNORMAL LOW (ref 98–111)
Creatinine, Ser: 0.65 mg/dL (ref 0.44–1.00)
GFR calc Af Amer: >60 mL/min (ref >60)
GFR calc non Af Amer: >60 mL/min (ref >60)
Glucose, Bld: 104 mg/dL — ABNORMAL HIGH (ref 70–99)
Potassium: 3.2 mmol/L — ABNORMAL LOW (ref 3.5–5.1)
Sodium: 131 mmol/L — ABNORMAL LOW (ref 135–145)

## 2018-12-25 LAB — CBC
HCT: 37.4 % (ref 36.0–46.0)
Hemoglobin: 12.4 g/dL (ref 12.0–15.0)
MCH: 29.5 pg (ref 26.0–34.0)
MCHC: 33.2 g/dL (ref 30.0–36.0)
MCV: 88.8 fL (ref 80.0–100.0)
Platelets: 396 10*3/uL (ref 150–400)
RBC: 4.21 MIL/uL (ref 3.87–5.11)
RDW: 13.5 % (ref 11.5–15.5)
WBC: 9 10*3/uL (ref 4.0–10.5)
nRBC: 0 % (ref 0.0–0.2)

## 2018-12-25 LAB — ABO/RH: ABO/RH(D): B POS

## 2018-12-26 LAB — NOVEL CORONAVIRUS, NAA (HOSP ORDER, SEND-OUT TO REF LAB; TAT 18-24 HRS): SARS-CoV-2, NAA: NOT DETECTED

## 2018-12-27 NOTE — H&P (Signed)
I was consulted by the above provider to assess the patient's 6 week history of feeling vaginal bulging. She does not reduce it. She has had a hysterectomy.   In the last few weeks she had a few episodes of urge incontinence but denies stress incontinence and normally does not wear a pad she voids every 2 or 3 hours gets up once a night. Often her flow is good but it does vary   On pelvic examination the patient had a moderate grade 3 cystocele with central defect that reached the introitus. Vaginal cuff decent for meter 9 cm to approximately 5 cm. bladder neck was almost fixed and she had no stress incontinence. With the prolapse reduced she did not have any rectocele. She had mild atrophy   postvoid residual was 211 mL   Patient has prolapse symptoms. Recent she has mild urge incontinence though it is uncommon. She has mild frequency and nocturia. Her elevated residual may have been a false positive. I drew the patient a picture. If she had prolapse surgery she would likely best benefit from a transvaginal vault suspension with cystocele repair and graft. The role of urodynamics discussed. Based upon her anatomy the residual may be a true reading.   Today  Prolapse and frequency stable  on urodynamics she had not voided for 30 minutes and was catheterized for 250 mL. Maximum bladder capacity was 1100 mL and the bladder was hypo sensitive. At high bladder volumes over a L there was a very mild increase in bladder pressure but within normal limits. she had no stress incontinence at a pressure of 95 cm of water. The bladder was stable. She had a hard time to generate a flow during voiding. The packing finally was removed. She had some weak voluntary contractions reaching a pressure 6-15 cm of water. There were not well sustained she voided 750 mL and her final residual was 240 mL. EMG activity increased some during the voiding phase. She bladder neck descended 2 or 3 cm and she had a cystocele noted. The  details of the urodynamics or sign dictated   We talked about watchful waiting versus pessary versus a transvaginal vault suspension a cystocele repair and graft. Mesh issues described   She should not have a sling. She understands that her high residual may be from anatomic causes but she also has a large capacity poorly contractile bladder.  Patient is sexually active     ALLERGIES: morphine - Nausea, Itching sulfa - Skin Rash    MEDICATIONS: Hydrochlorothiazide  Align  Amitriptyline Hcl  Aspirin Ec 81 mg tablet, delayed release  Claritin  Olmesartan Medoxomil  Pepcid Ac 10 mg tablet  Rosuvastatin Calcium  Vitamin D3     GU PSH: Complex cystometrogram, w/ void pressure and urethral pressure profile studies, any technique - 11/03/2018 Complex Uroflow - 11/03/2018 Emg surf Electrd - 11/03/2018 Hysterectomy Inject For cystogram - 11/03/2018 Intrabd voidng Press - 11/03/2018    NON-GU PSH: None   GU PMH: Cystocele, midline - 10/28/2018 Urge incontinence - 10/28/2018 Urinary Frequency - 10/28/2018    NON-GU PMH: Anxiety Arthritis GERD Hypercholesterolemia Hypertension Hypothyroidism Stroke/TIA    FAMILY HISTORY: 1 Daughter - Daughter 1 son - Son Kidney Stones - Mother Patient's father is deceased - Father Patient's mother is deceased - Mother   SOCIAL HISTORY: Marital Status: Married Current Smoking Status: Patient does not smoke anymore. Has not smoked since 10/27/1968. Smoked for 10 years. Smoked 1 pack per day.   Tobacco Use  Assessment Completed: Used Tobacco in last 30 days? Does not use smokeless tobacco. Drinks 3 drinks per week. Types of alcohol consumed: Wine.  Does not drink caffeine.    REVIEW OF SYSTEMS:    GU Review Female:   Patient denies frequent urination, hard to postpone urination, burning /pain with urination, get up at night to urinate, leakage of urine, stream starts and stops, trouble starting your stream, have to strain to urinate, and being pregnant.   Gastrointestinal (Upper):   Patient denies nausea, vomiting, and indigestion/ heartburn.  Gastrointestinal (Lower):   Patient denies diarrhea and constipation.  Constitutional:   Patient denies fever, night sweats, weight loss, and fatigue.  Skin:   Patient denies skin rash/ lesion and itching.  Eyes:   Patient denies blurred vision and double vision.  Ears/ Nose/ Throat:   Patient denies sore throat and sinus problems.  Hematologic/Lymphatic:   Patient denies swollen glands and easy bruising.  Cardiovascular:   Patient denies leg swelling and chest pains.  Respiratory:   Patient denies cough and shortness of breath.  Endocrine:   Patient denies excessive thirst.  Musculoskeletal:   Patient denies back pain and joint pain.  Neurological:   Patient denies headaches and dizziness.  Psychologic:   Patient denies depression and anxiety.   VITAL SIGNS:      11/10/2018 11:25 AM  Weight 146.2 lb / 66.32 kg  Height 63 in / 160.02 cm  BP 146/70 mmHg  Pulse 79 /min  Temperature 97.7 F / 36.5 C  BMI 25.9 kg/m   PAST DATA REVIEWED:  Source Of History:  Patient   PROCEDURES: None   ASSESSMENT:      ICD-10 Details  1 GU:   Cystocele, midline - N81.11   2   Urinary Frequency - R35.0               Notes:   I drew her a picture and we talked about prolapse surgery in detail. Pros, cons, general surgical and anesthetic risks, and other options including behavioral therapy, pessaries, and watchful waiting were discussed. She understands that prolapse repairs are successful in 80-85% of cases for prolapse symptoms and can recur anteriorly, posteriorly, and/or apically. She understands that in most cases I use a graft and general risks were discussed. Surgical risks were described but not limited to the discussion of injury to neighboring structures including the bowel (with possible life-threatening sepsis and colostomy), bladder, urethra, vagina (all resulting in further surgery), and ureter  (resulting in re-implantation). We talked about injury to nerves/soft tissue leading to debilitating and intractable pelvic, abdominal, and lower extremity pain syndromes and neuropathies. The risks of buttock pain, intractable dyspareunia, and vaginal narrowing and shortening with sequelae were discussed. Bleeding risks, transfusion rates, and infection were discussed. The risk of persistent, de novo, or worsening bladder and/or bowel incontinence/dysfunction was discussed. The need for CIC was described as well the usual post-operative course. The patient understands that she might not reach her treatment goal and that she might be worse following surgery.    PLAN:           Schedule Return Visit/Planned Activity: Return PRN - Office Visit   After a thorough review of the management options for the patient's condition the patient  elected to proceed with surgical therapy as noted above. We have discussed the potential benefits and risks of the procedure, side effects of the proposed treatment, the likelihood of the patient achieving the goals of the procedure, and any  potential problems that might occur during the procedure or recuperation. Informed consent has been obtained.

## 2018-12-28 MED ORDER — CLINDAMYCIN PHOSPHATE 600 MG/50ML IV SOLN
600.0000 mg | INTRAVENOUS | Status: AC
  Administered 2018-12-29: 600 mg via INTRAVENOUS
  Filled 2018-12-28: qty 50

## 2018-12-28 NOTE — Anesthesia Preprocedure Evaluation (Addendum)
Anesthesia Evaluation  Patient identified by MRN, date of birth, ID band Patient awake    Reviewed: Allergy & Precautions, NPO status , Patient's Chart, lab work & pertinent test results  History of Anesthesia Complications (+) PONV  Airway Mallampati: I       Dental no notable dental hx. (+) Teeth Intact   Pulmonary former smoker,    Pulmonary exam normal breath sounds clear to auscultation       Cardiovascular hypertension, Pt. on medications Normal cardiovascular exam Rhythm:Regular Rate:Normal     Neuro/Psych    GI/Hepatic GERD  ,  Endo/Other  Hypothyroidism   Renal/GU      Musculoskeletal   Abdominal Normal abdominal exam  (+)   Peds  Hematology   Anesthesia Other Findings   Reproductive/Obstetrics                           Anesthesia Physical Anesthesia Plan  ASA: II  Anesthesia Plan: General   Post-op Pain Management:    Induction:   PONV Risk Score and Plan: 4 or greater and Ondansetron and Dexamethasone  Airway Management Planned: LMA and Oral ETT  Additional Equipment:   Intra-op Plan:   Post-operative Plan: Extubation in OR  Informed Consent: I have reviewed the patients History and Physical, chart, labs and discussed the procedure including the risks, benefits and alternatives for the proposed anesthesia with the patient or authorized representative who has indicated his/her understanding and acceptance.     Dental advisory given  Plan Discussed with: CRNA and Surgeon  Anesthesia Plan Comments: (See PAT note 12/25/2018, Konrad Felix, PA-C)      Anesthesia Quick Evaluation

## 2018-12-28 NOTE — Progress Notes (Signed)
SPOKE W/ patient via phone.      SCREENING SYMPTOMS OF COVID 19:     COUGH--no   RUNNY NOSE---no    SORE THROAT---no   NASAL CONGESTION----no   SNEEZING---no   SHORTNESS OF BREATH---no   DIFFICULTY BREATHING---no   TEMP >100.0 -----no   UNEXPLAINED BODY ACHES------no   CHILLS --------no    HEADACHES ---------no   LOSS OF SMELL/ TASTE --------no       HAVE YOU OR ANY FAMILY MEMBER TRAVELLED PAST 14 DAYS OUT OF THE    COUNTY---no STATE----no COUNTRY----no   HAVE YOU OR ANY FAMILY MEMBER BEEN EXPOSED TO ANYONE WITH COVID 19?    no 

## 2018-12-28 NOTE — Progress Notes (Signed)
Pt called about COVID screening, VM left

## 2018-12-28 NOTE — Progress Notes (Addendum)
Anesthesia Chart Review   Case:  [id number] Date/Time:  12/29/18 0715   Procedures:      CYSTOSCOPY ANTERIOR REPAIR (CYSTOCELE) (N/A )     VAGINAL VAULT SUSPENSION AND GRAFT (N/A )   Anesthesia type:  General   Pre-op diagnosis:  CYSTOCELE VAULT PROLAPSE   Location:  [place] [facility]   Surgeon:  Bjorn Loser, MD      DISCUSSION: 78 yo former smoker (quit 01/01/70) with h/o PONV, HTN, HLD, LBBB, loop recorder in place (05/01/2017), cystocele vault prolapse scheduled for above procedure 12/29/18 with Dr. Bjorn Loser.    Pt last seen by cardiologist, Dr. Dorris Carnes, 12/18/2018 due to dizziness.  Per OV note, "Patient says she feels OK today.  The pt's spells sound vagal in nature   Orthostatics are negative    No syncope   Today she feels fine   Loop recorder shows no arrhythmia   No bradycardia.  LBBB unchanged."  Pt asymptomatic at PAT visit.   Anticipate pt can proceed with planned procedure barring acute status change.    VS: BP (!) 143/63 (BP Location: Right Arm)   Pulse 98   Temp 37.2 C (Oral)   Resp 18   Ht 5\' 3"  (1.6 m)   Wt 65.4 kg   LMP 07/29/1990   SpO2 99%   BMI 25.54 kg/m   PROVIDERS: Marton Redwood, MD is PCP   Dorris Carnes, MD is Cardiologist  LABS: Labs reviewed: Acceptable for surgery. (all labs ordered are listed, but only abnormal results are displayed)  Labs Reviewed  BASIC METABOLIC PANEL - Abnormal; Notable for the following components:      Result Value   Sodium 131 (*)    Potassium 3.2 (*)    Chloride 97 (*)    Glucose, Bld 104 (*)    All other components within normal limits  CBC  PROTIME-INR  TYPE AND SCREEN  ABO/RH     IMAGES: Carotid Doppler 06/13/2012 Summary: No significant extracranial carotid artery stenosis demonstrated. Vertebrals are patent with antegrade flow.  EKG: 12/18/2018 Rate 80 bpm Normal sinus rhythm  Nonspecific intraventricular block  T wave abnormality, consider inferior ischemia LBBB   CV: Stress Test  10/29/2016  Nuclear stress EF: 68%.  There was no ST segment deviation noted during stress.  Defect 1: There is a small defect of moderate severity present in the apical septal location.   Low risk stress nuclear study with a fixed apicoseptal defect that appears most likely to be an artifact. Normal left ventricular regional and global systolic function. Past Medical History:  Diagnosis Date  . Alopecia   . Arthritis   . Complication of anesthesia   . DJD (degenerative joint disease), cervical 5/02   [facility]  . HSV-1 infection   . Hyperlipidemia   . Hypertension   . Lichen sclerosus 53/66/4403   biopsy proven  . Liver hemangioma 2011   Dr. Earlean Shawl  . PONV (postoperative nausea and vomiting)   . Thyroiditis   . Transient global amnesia    not fully diag    Past Surgical History:  Procedure Laterality Date  . APPENDECTOMY  1956  . CARDIOVASCULAR STRESS TEST  10/29/2006   EF 78%.   Marland Kitchen LOOP RECORDER INSERTION N/A 05/01/2017   Procedure: LOOP RECORDER INSERTION;  Surgeon: Evans Lance, MD;  Location: [place] [facility];  Service: Cardiovascular;  Laterality: N/A;  . ROTATOR CUFF REPAIR Bilateral   . SHOULDER SURGERY    .  TONSILLECTOMY AND ADENOIDECTOMY  1957  . TOTAL ABDOMINAL HYSTERECTOMY W/ BILATERAL SALPINGOOPHORECTOMY  7/90  . US ECHOCARDIOGRAPHY  10/11/2009   EF 55-60%    MEDICATIONS: . acetaminophen (TYLENOL) 500 MG tablet  . amitriptyline (ELAVIL) 25 MG tablet  . aspirin EC 81 MG tablet  . Biotin 5000 MCG TABS  . BIOTIN PO  . cholecalciferol (VITAMIN D) 25 MCG (1000 UT) tablet  . Cholecalciferol (VITAMIN D-3 PO)  . clobetasol ointment (TEMOVATE) 0.05 %  . famotidine (PEPCID) 10 MG tablet  . hydrochlorothiazide (HYDRODIURIL) 12.5 MG tablet  . ibuprofen (ADVIL,MOTRIN) 200 MG tablet  . levothyroxine (SYNTHROID, LEVOTHROID) 50 MCG tablet  . LORazepam (ATIVAN) 0.5 MG tablet  . nystatin cream (MYCOSTATIN)  . olmesartan (BENICAR) 40 MG tablet  . ondansetron (ZOFRAN)  4 MG tablet  . PROAIR HFA 108 (90 BASE) MCG/ACT inhaler  . rosuvastatin (CRESTOR) 10 MG tablet  . triamcinolone (NASACORT ALLERGY 24HR) 55 MCG/ACT AERO nasal inhaler   No current facility-administered medications for this encounter.    Derrill Memo ON 12/29/2018] clindamycin (CLEOCIN) IVPB 600 mg   Maia Plan [facility] Pre-Surgical Testing [phone] 12/28/18 10:45 AM

## 2018-12-29 ENCOUNTER — Ambulatory Visit (HOSPITAL_COMMUNITY)
Admission: RE | Admit: 2018-12-29 | Discharge: 2018-12-29 | Disposition: A | Discharge status: Home / Self Care | Payer: Medicare Other | Attending: Urology | Admitting: Urology

## 2018-12-29 ENCOUNTER — Ambulatory Visit (HOSPITAL_COMMUNITY): Payer: Medicare Other | Admitting: Anesthesiology

## 2018-12-29 ENCOUNTER — Encounter (HOSPITAL_COMMUNITY)
Admission: RE | Disposition: A | Discharge status: Home / Self Care | Payer: Self-pay | Source: Home / Self Care | Attending: Urology

## 2018-12-29 ENCOUNTER — Ambulatory Visit (HOSPITAL_COMMUNITY): Payer: Medicare Other | Admitting: Physician Assistant

## 2018-12-29 ENCOUNTER — Encounter (HOSPITAL_COMMUNITY): Payer: Self-pay

## 2018-12-29 DIAGNOSIS — N8111 Cystocele, midline: Secondary | ICD-10-CM | POA: Insufficient documentation

## 2018-12-29 DIAGNOSIS — E039 Hypothyroidism, unspecified: Secondary | ICD-10-CM | POA: Insufficient documentation

## 2018-12-29 DIAGNOSIS — I10 Essential (primary) hypertension: Secondary | ICD-10-CM | POA: Diagnosis not present

## 2018-12-29 DIAGNOSIS — Z87891 Personal history of nicotine dependence: Secondary | ICD-10-CM | POA: Insufficient documentation

## 2018-12-29 DIAGNOSIS — N8110 Cystocele, unspecified: Secondary | ICD-10-CM | POA: Diagnosis not present

## 2018-12-29 DIAGNOSIS — M1990 Unspecified osteoarthritis, unspecified site: Secondary | ICD-10-CM | POA: Diagnosis not present

## 2018-12-29 DIAGNOSIS — Z7982 Long term (current) use of aspirin: Secondary | ICD-10-CM | POA: Diagnosis not present

## 2018-12-29 DIAGNOSIS — Z8673 Personal history of transient ischemic attack (TIA), and cerebral infarction without residual deficits: Secondary | ICD-10-CM | POA: Insufficient documentation

## 2018-12-29 DIAGNOSIS — N993 Prolapse of vaginal vault after hysterectomy: Secondary | ICD-10-CM | POA: Diagnosis not present

## 2018-12-29 DIAGNOSIS — Z79899 Other long term (current) drug therapy: Secondary | ICD-10-CM | POA: Insufficient documentation

## 2018-12-29 DIAGNOSIS — E785 Hyperlipidemia, unspecified: Secondary | ICD-10-CM | POA: Diagnosis not present

## 2018-12-29 DIAGNOSIS — Z538 Procedure and treatment not carried out for other reasons: Secondary | ICD-10-CM | POA: Diagnosis not present

## 2018-12-29 DIAGNOSIS — K219 Gastro-esophageal reflux disease without esophagitis: Secondary | ICD-10-CM | POA: Insufficient documentation

## 2018-12-29 HISTORY — PX: CYSTOCELE REPAIR: SHX163

## 2018-12-29 LAB — TYPE AND SCREEN
ABO/RH(D): B POS
Antibody Screen: NEGATIVE

## 2018-12-29 SURGERY — COLPORRHAPHY, ANTERIOR, FOR CYSTOCELE REPAIR
Anesthesia: General | Site: Vagina

## 2018-12-29 MED ORDER — SODIUM CHLORIDE 0.9 % IV SOLN
INTRAVENOUS | Status: DC | PRN
  Administered 2018-12-29: 500 mL

## 2018-12-29 MED ORDER — FENTANYL CITRATE (PF) 250 MCG/5ML IJ SOLN
INTRAMUSCULAR | Status: AC
  Filled 2018-12-29: qty 5

## 2018-12-29 MED ORDER — PHENAZOPYRIDINE HCL 200 MG PO TABS
200.0000 mg | ORAL_TABLET | ORAL | Status: AC
  Administered 2018-12-29: 200 mg via ORAL
  Filled 2018-12-29: qty 1

## 2018-12-29 MED ORDER — CIPROFLOXACIN IN D5W 400 MG/200ML IV SOLN
400.0000 mg | INTRAVENOUS | Status: AC
  Administered 2018-12-29: 400 mg via INTRAVENOUS
  Filled 2018-12-29: qty 200

## 2018-12-29 MED ORDER — LACTATED RINGERS IV SOLN
INTRAVENOUS | Status: DC
  Administered 2018-12-29: 06:00:00 via INTRAVENOUS

## 2018-12-29 MED ORDER — SUGAMMADEX SODIUM 200 MG/2ML IV SOLN
INTRAVENOUS | Status: AC
  Filled 2018-12-29: qty 2

## 2018-12-29 MED ORDER — ONDANSETRON HCL 4 MG/2ML IJ SOLN
INTRAMUSCULAR | Status: AC
  Filled 2018-12-29: qty 2

## 2018-12-29 MED ORDER — LIDOCAINE 2% (20 MG/ML) 5 ML SYRINGE
INTRAMUSCULAR | Status: AC
  Filled 2018-12-29: qty 5

## 2018-12-29 MED ORDER — FENTANYL CITRATE (PF) 100 MCG/2ML IJ SOLN
INTRAMUSCULAR | Status: DC | PRN
  Administered 2018-12-29 (×2): 50 ug via INTRAVENOUS

## 2018-12-29 MED ORDER — LIDOCAINE HCL (CARDIAC) PF 100 MG/5ML IV SOSY
PREFILLED_SYRINGE | INTRAVENOUS | Status: DC | PRN
  Administered 2018-12-29: 40 mg via INTRAVENOUS

## 2018-12-29 MED ORDER — PROPOFOL 10 MG/ML IV BOLUS
INTRAVENOUS | Status: AC
  Filled 2018-12-29: qty 20

## 2018-12-29 MED ORDER — LIDOCAINE-EPINEPHRINE 1 %-1:100000 IJ SOLN
INTRAMUSCULAR | Status: AC
  Filled 2018-12-29: qty 2

## 2018-12-29 MED ORDER — ONDANSETRON HCL 4 MG/2ML IJ SOLN
INTRAMUSCULAR | Status: DC | PRN
  Administered 2018-12-29: 4 mg via INTRAVENOUS

## 2018-12-29 MED ORDER — ESTRADIOL 0.1 MG/GM VA CREA
TOPICAL_CREAM | VAGINAL | Status: AC
  Filled 2018-12-29: qty 85

## 2018-12-29 MED ORDER — DEXAMETHASONE SODIUM PHOSPHATE 10 MG/ML IJ SOLN
INTRAMUSCULAR | Status: AC
  Filled 2018-12-29: qty 1

## 2018-12-29 MED ORDER — DEXAMETHASONE SODIUM PHOSPHATE 10 MG/ML IJ SOLN
INTRAMUSCULAR | Status: DC | PRN
  Administered 2018-12-29: 10 mg via INTRAVENOUS

## 2018-12-29 MED ORDER — SODIUM CHLORIDE 0.9 % IV SOLN
INTRAVENOUS | Status: AC
  Filled 2018-12-29: qty 500000

## 2018-12-29 MED ORDER — STERILE WATER FOR IRRIGATION IR SOLN
Status: DC | PRN
  Administered 2018-12-29: 1000 mL

## 2018-12-29 MED ORDER — PROPOFOL 10 MG/ML IV BOLUS
INTRAVENOUS | Status: DC | PRN
  Administered 2018-12-29: 150 mg via INTRAVENOUS

## 2018-12-29 MED ORDER — SUGAMMADEX SODIUM 200 MG/2ML IV SOLN
INTRAVENOUS | Status: DC | PRN
  Administered 2018-12-29: 150 mg via INTRAVENOUS

## 2018-12-29 MED ORDER — ROCURONIUM BROMIDE 10 MG/ML (PF) SYRINGE
PREFILLED_SYRINGE | INTRAVENOUS | Status: AC
  Filled 2018-12-29: qty 10

## 2018-12-29 MED ORDER — ROCURONIUM BROMIDE 10 MG/ML (PF) SYRINGE
PREFILLED_SYRINGE | INTRAVENOUS | Status: DC | PRN
  Administered 2018-12-29: 40 mg via INTRAVENOUS

## 2018-12-29 SURGICAL SUPPLY — 48 items
BAG DECANTER FOR FLEXI CONT (MISCELLANEOUS) ×3 IMPLANT
BAG URINE DRAINAGE (UROLOGICAL SUPPLIES) ×2 IMPLANT
BLADE SURG 15 STRL LF DISP TIS (BLADE) ×2 IMPLANT
BLADE SURG 15 STRL SS (BLADE) ×3
CATH FOLEY 2WAY SLVR  5CC 14FR (CATHETERS) ×1
CATH FOLEY 2WAY SLVR 5CC 14FR (CATHETERS) ×2 IMPLANT
COVER MAYO STAND STRL (DRAPES) ×3 IMPLANT
COVER WAND RF STERILE (DRAPES) IMPLANT
DECANTER SPIKE VIAL GLASS SM (MISCELLANEOUS) ×3 IMPLANT
DEVICE CAPIO SLIM SINGLE (INSTRUMENTS) IMPLANT
DRAIN PENROSE 18X1/4 LTX STRL (WOUND CARE) ×3 IMPLANT
DRAPE SHEET LG 3/4 BI-LAMINATE (DRAPES) ×3 IMPLANT
ELECT PENCIL ROCKER SW 15FT (MISCELLANEOUS) ×3 IMPLANT
GAUZE 4X4 16PLY RFD (DISPOSABLE) ×6 IMPLANT
GAUZE PACKING 2X5 YD STRL (GAUZE/BANDAGES/DRESSINGS) ×3 IMPLANT
GLOVE BIO SURGEON STRL SZ 6.5 (GLOVE) ×3 IMPLANT
GLOVE BIOGEL M STRL SZ7.5 (GLOVE) ×3 IMPLANT
GLOVE ECLIPSE 8.5 STRL (GLOVE) ×3 IMPLANT
GOWN STRL REUS W/TWL XL LVL3 (GOWN DISPOSABLE) ×3 IMPLANT
HOLDER FOLEY CATH W/STRAP (MISCELLANEOUS) ×3 IMPLANT
IV NS 1000ML (IV SOLUTION) ×3
IV NS 1000ML BAXH (IV SOLUTION) ×2 IMPLANT
KIT BASIN OR (CUSTOM PROCEDURE TRAY) ×3 IMPLANT
KIT TURNOVER KIT A (KITS) IMPLANT
NDL MAYO 6 CRC TAPER PT (NEEDLE) ×1 IMPLANT
NEEDLE HYPO 22GX1.5 SAFETY (NEEDLE) ×3 IMPLANT
NEEDLE MAYO 6 CRC TAPER PT (NEEDLE) ×3 IMPLANT
NS IRRIG 1000ML POUR BTL (IV SOLUTION) ×3 IMPLANT
PACK CYSTO (CUSTOM PROCEDURE TRAY) ×3 IMPLANT
PLUG CATH AND CAP STER (CATHETERS) ×3 IMPLANT
RETRACTOR STAY HOOK 5MM (MISCELLANEOUS) ×3 IMPLANT
SHEET LAVH (DRAPES) ×3 IMPLANT
SUT CAPIO ETHIBPND (SUTURE) IMPLANT
SUT VIC AB 0 CT1 27 (SUTURE) ×3
SUT VIC AB 0 CT1 27XBRD ANTBC (SUTURE) ×2 IMPLANT
SUT VIC AB 2-0 CT1 27 (SUTURE) ×6
SUT VIC AB 2-0 CT1 27XBRD (SUTURE) ×4 IMPLANT
SUT VIC AB 2-0 SH 27 (SUTURE) ×6
SUT VIC AB 2-0 SH 27X BRD (SUTURE) ×4 IMPLANT
SUT VIC AB 3-0 SH 27 (SUTURE) ×6
SUT VIC AB 3-0 SH 27XBRD (SUTURE) ×4 IMPLANT
SUT VICRYL 0 UR6 27IN ABS (SUTURE) ×6 IMPLANT
SYR 10ML LL (SYRINGE) ×3 IMPLANT
TOWEL OR 17X26 10 PK STRL BLUE (TOWEL DISPOSABLE) ×3 IMPLANT
TOWEL OR NON WOVEN STRL DISP B (DISPOSABLE) ×3 IMPLANT
TUBING CONNECTING 10 (TUBING) ×3 IMPLANT
WATER STERILE IRR 1000ML POUR (IV SOLUTION) ×3 IMPLANT
YANKAUER SUCT BULB TIP 10FT TU (MISCELLANEOUS) ×3 IMPLANT

## 2018-12-29 NOTE — Anesthesia Postprocedure Evaluation (Signed)
Anesthesia Post Note  Patient: Sabrina Parks  Procedure(s) Performed: exam under anesthesia (N/A Vagina )     Patient location during evaluation: PACU Anesthesia Type: General Level of consciousness: awake Pain management: pain level controlled Vital Signs Assessment: post-procedure vital signs reviewed and stable Respiratory status: spontaneous breathing Cardiovascular status: stable Postop Assessment: no apparent nausea or vomiting Anesthetic complications: no    Last Vitals:  Vitals:   12/29/18 0930 12/29/18 0945  BP: (!) 107/54 113/61  Pulse:    Resp: 16 14  Temp: (!) 36.3 C (!) 36.4 C  SpO2: 96% 100%    Last Pain:  Vitals:   12/29/18 0945  TempSrc:   PainSc: 0-No pain   Pain Goal:                   Huston Foley

## 2018-12-29 NOTE — Interval H&P Note (Signed)
History and Physical Interval Note:  12/29/2018 7:12 AM  Sabrina Parks  has presented today for surgery, with the diagnosis of CYSTOCELE VAULT PROLAPSE.  The various methods of treatment have been discussed with the patient and family. After consideration of risks, benefits and other options for treatment, the patient has consented to  Procedure(s): CYSTOSCOPY ANTERIOR REPAIR (CYSTOCELE) (N/A) VAGINAL VAULT SUSPENSION AND GRAFT (N/A) as a surgical intervention.  The patient's history has been reviewed, patient examined, no change in status, stable for surgery.  I have reviewed the patient's chart and labs.  Questions were answered to the patient's satisfaction.     Jamie-Lee Galdamez A Caress Reffitt

## 2018-12-29 NOTE — Op Note (Signed)
Preoperative diagnosis: Cystocele and vault prolapse Postoperative diagnosis: Cystocele and vault prolapse Surgery: Examination under anesthesia Surgeon: Dr. Nicki Reaper Mcdiarmid Assistant: Debbrah Alar  The patient has the above diagnosis and consent of the above procedure.  Extra care was taken with leg positioning to minimize the risk of compartment syndrome and neuropathy and deep vein thrombosis.  Under anesthesia she had a small grade 3 cystocele that reached the introitus.  She has shortening of the vagina.  She had a very narrow pelvis and tightness of the introitus especially at 6:00.  Foley catheter was initially inserted.  The patient had a very short anterior vaginal wall.  Her urethra was noted prior to surgery was very well supported.  It was almost concealed.  I was very diligent to place a 3-0 Vicryl suture at the apex.  I even placed it back a number of millimeters further but in my opinion I was starting to turn the corner at the apex.  She only had approximately 3 to 3-1/2 cm of length at most.  Exposure to the apex bilaterally due to her narrow pelvis was difficult.  I could feel the spines by deep palpation.  I considered making an incision at 6:00 to see if this would improve my exposure but it did not change her intraoperative findings as opposed to her anatomy and the ability to fix the repair safely robustly and without causing a lot of vaginal shortening  I used a number retractors and add very good lighting and was able to evaluate her anatomy.  In my opinion she did not have near enough anatomy to do an anterior repair and was primarily a vault issue.  The safety of the vault repair was in question due to exposure.  I strongly felt that the treatment of choice would be a robotic sacral colpopexy.  When I reduce the cough had a high grade 1 cystocele and a sacral colpopexy would give her excellent length for future sexual function and gave her a good strong repair.  After  numerous evaluations I decided not to do the surgery and I will refer her to Dr Louis Meckel  for sacrocolpopexy

## 2018-12-29 NOTE — Progress Notes (Signed)
Pt has skin tear on left forearm, she states she skinned it on an iron railing.  No drainage noted.  Pt states she cleaned it well with the chg soap this morning before surgery.  Placed a clear occlusive dressing over the site.

## 2018-12-29 NOTE — Anesthesia Procedure Notes (Signed)
Procedure Name: Intubation Date/Time: 12/29/2018 7:43 AM Performed by: Glory Buff, CRNA Pre-anesthesia Checklist: Patient identified, Emergency Drugs available, Suction available and Patient being monitored Patient Re-evaluated:Patient Re-evaluated prior to induction Oxygen Delivery Method: Circle system utilized Preoxygenation: Pre-oxygenation with 100% oxygen Induction Type: IV induction Ventilation: Mask ventilation without difficulty Laryngoscope Size: Miller and 3 Grade View: Grade II Tube type: Oral Tube size: 7.0 mm Number of attempts: 1 Airway Equipment and Method: Stylet and Oral airway Placement Confirmation: ETT inserted through vocal cords under direct vision,  positive ETCO2 and breath sounds checked- equal and bilateral Secured at: 20 cm Tube secured with: Tape Dental Injury: Teeth and Oropharynx as per pre-operative assessment

## 2018-12-29 NOTE — Discharge Instructions (Signed)
As discussed with Dr. Matilde Sprang.

## 2018-12-29 NOTE — Transfer of Care (Signed)
Immediate Anesthesia Transfer of Care Note  Patient: Sabrina Parks  Procedure(s) Performed: exam under anesthesia (N/A Vagina )  Patient Location: PACU  Anesthesia Type:General  Level of Consciousness: drowsy, patient cooperative and responds to stimulation  Airway & Oxygen Therapy: Patient Spontanous Breathing and Patient connected to face mask oxygen  Post-op Assessment: Report given to RN and Post -op Vital signs reviewed and stable  Post vital signs: Reviewed and stable  Last Vitals:  Vitals Value Taken Time  BP    Temp    Pulse 72 12/29/2018  8:51 AM  Resp 14 12/29/2018  8:51 AM  SpO2 100 % 12/29/2018  8:51 AM    Last Pain:  Vitals:   12/29/18 0615  TempSrc:   PainSc: 0-No pain         Complications: No apparent anesthesia complications

## 2018-12-30 ENCOUNTER — Encounter (HOSPITAL_COMMUNITY): Payer: Self-pay | Admitting: Urology

## 2018-12-30 DIAGNOSIS — N8111 Cystocele, midline: Secondary | ICD-10-CM | POA: Diagnosis not present

## 2018-12-30 DIAGNOSIS — N819 Female genital prolapse, unspecified: Secondary | ICD-10-CM | POA: Diagnosis not present

## 2019-01-06 ENCOUNTER — Ambulatory Visit (INDEPENDENT_AMBULATORY_CARE_PROVIDER_SITE_OTHER): Payer: Medicare Other | Admitting: *Deleted

## 2019-01-06 ENCOUNTER — Other Ambulatory Visit: Payer: Self-pay | Admitting: Urology

## 2019-01-06 DIAGNOSIS — R55 Syncope and collapse: Secondary | ICD-10-CM

## 2019-01-07 DIAGNOSIS — B070 Plantar wart: Secondary | ICD-10-CM | POA: Diagnosis not present

## 2019-01-07 LAB — CUP PACEART REMOTE DEVICE CHECK
Date Time Interrogation Session: 20200611020655
Implantable Pulse Generator Implant Date: 20181004

## 2019-01-15 NOTE — Progress Notes (Signed)
Carelink Summary Report / Loop Recorder 

## 2019-01-20 DIAGNOSIS — H35371 Puckering of macula, right eye: Secondary | ICD-10-CM | POA: Diagnosis not present

## 2019-02-05 DIAGNOSIS — Z1231 Encounter for screening mammogram for malignant neoplasm of breast: Secondary | ICD-10-CM | POA: Diagnosis not present

## 2019-02-08 ENCOUNTER — Ambulatory Visit (INDEPENDENT_AMBULATORY_CARE_PROVIDER_SITE_OTHER): Payer: Medicare Other | Admitting: *Deleted

## 2019-02-08 DIAGNOSIS — G459 Transient cerebral ischemic attack, unspecified: Secondary | ICD-10-CM | POA: Diagnosis not present

## 2019-02-09 DIAGNOSIS — N819 Female genital prolapse, unspecified: Secondary | ICD-10-CM | POA: Diagnosis not present

## 2019-02-09 LAB — CUP PACEART REMOTE DEVICE CHECK
Date Time Interrogation Session: 20200714024109
Implantable Pulse Generator Implant Date: 20181004

## 2019-02-19 NOTE — Progress Notes (Signed)
Carelink Summary Report / Loop Recorder 

## 2019-02-22 ENCOUNTER — Other Ambulatory Visit (HOSPITAL_COMMUNITY)
Admission: RE | Admit: 2019-02-22 | Discharge: 2019-02-22 | Disposition: A | Discharge status: Home / Self Care | Payer: Medicare Other | Source: Ambulatory Visit | Attending: Urology | Admitting: Urology

## 2019-02-22 DIAGNOSIS — Z20828 Contact with and (suspected) exposure to other viral communicable diseases: Secondary | ICD-10-CM | POA: Insufficient documentation

## 2019-02-23 ENCOUNTER — Ambulatory Visit (INDEPENDENT_AMBULATORY_CARE_PROVIDER_SITE_OTHER): Payer: Medicare Other | Admitting: Orthopaedic Surgery

## 2019-02-23 ENCOUNTER — Encounter: Payer: Self-pay | Admitting: Orthopaedic Surgery

## 2019-02-23 ENCOUNTER — Other Ambulatory Visit: Payer: Self-pay

## 2019-02-23 DIAGNOSIS — Z85828 Personal history of other malignant neoplasm of skin: Secondary | ICD-10-CM | POA: Diagnosis not present

## 2019-02-23 DIAGNOSIS — M25551 Pain in right hip: Secondary | ICD-10-CM | POA: Insufficient documentation

## 2019-02-23 DIAGNOSIS — L821 Other seborrheic keratosis: Secondary | ICD-10-CM | POA: Diagnosis not present

## 2019-02-23 DIAGNOSIS — L570 Actinic keratosis: Secondary | ICD-10-CM | POA: Diagnosis not present

## 2019-02-23 LAB — SARS CORONAVIRUS 2 (TAT 6-24 HRS): SARS Coronavirus 2: NEGATIVE

## 2019-02-23 MED ORDER — BUPIVACAINE HCL 0.5 % IJ SOLN
2.0000 mL | INTRAMUSCULAR | Status: AC | PRN
  Administered 2019-02-23: 2 mL via INTRA_ARTICULAR

## 2019-02-23 MED ORDER — METHYLPREDNISOLONE ACETATE 40 MG/ML IJ SUSP
80.0000 mg | INTRAMUSCULAR | Status: AC | PRN
  Administered 2019-02-23: 80 mg via INTRA_ARTICULAR

## 2019-02-23 MED ORDER — LIDOCAINE HCL 1 % IJ SOLN
2.0000 mL | INTRAMUSCULAR | Status: AC | PRN
  Administered 2019-02-23: 2 mL

## 2019-02-23 NOTE — Patient Instructions (Addendum)
THE QUARANTINE INSTRUCTIONS AS OUTLINED IN YOUR HANDOUT.                Sabrina Parks    Your procedure is scheduled on: Thursday 02/25/19   Report to [facility] Main  Entrance  Report to Short Stay at 5:30 AM   1 VISITOR IS ALLOWED TO WAIT IN WAITING ROOM  ONLY DAY OF YOUR SURGERY.    Call this number if you have problems the morning of surgery [phone]    Remember: Do not eat food or drink liquids :After Midnight.  BRUSH YOUR TEETH MORNING OF SURGERY AND RINSE YOUR MOUTH OUT, NO CHEWING GUM CANDY OR MINTS.     Take these medicines the morning of surgery with A SIP OF WATER: Levothyroxine, may use inhaler if needed and bring it to the hospital with you.                                 You may not have any metal on your body including hair pins and piercings              Do not wear jewelry, make-up, lotions, powders or perfumes, deodorant             Do not wear nail polish.  Do not shave  48 hours prior to surgery.           Do not bring valuables to the hospital. [place].  Contacts, dentures or bridgework may not be worn into surgery.        Name and phone number of your driver:  Special Instructions: N/A              Please read over the following fact sheets you were given: _____________________________________________________________________             [facility] - Preparing for Surgery Before surgery, you can play an important role.   Because skin is not sterile, your skin needs to be as free of germs as possible.   You can reduce the number of germs on your skin by washing with CHG (chlorahexidine gluconate) soap before surgery .  CHG is an antiseptic cleaner which kills germs and bonds with the skin to continue killing germs even after washing. Please DO NOT use if you have an allergy to CHG or antibacterial soaps.   If your skin becomes reddened/irritated stop using the CHG and inform your  nurse when you arrive at Short Stay. Do not shave (including legs and underarms) for at least 48 hours prior to the first CHG shower. Please follow these instructions carefully:  1.  Shower with CHG Soap the night before surgery and the  morning of Surgery.  2.  If you choose to wash your hair, wash your hair first as usual with your  normal  shampoo.  3.  After you shampoo, rinse your hair and body thoroughly to remove the  shampoo.                                        4.  Use CHG as you would any other liquid soap.  You can apply chg directly  to the skin and wash  Gently with a scrungie or clean washcloth.  5.  Apply the CHG Soap to your body ONLY FROM THE NECK DOWN.   Do not use on face/ open                           Wound or open sores. Avoid contact with eyes, ears mouth and genitals (private parts).                       Wash face,  Genitals (private parts) with your normal soap.             6.  Wash thoroughly, paying special attention to the area where your surgery  will be performed.  7.  Thoroughly rinse your body with warm water from the neck down.  8.  DO NOT shower/wash with your normal soap after using and rinsing off  the CHG Soap.                9.  Pat yourself dry with a clean towel.            10.  Wear clean pajamas.            11.  Place clean sheets on your bed the night of your first shower and do not  sleep with pets. Day of Surgery : Do not apply any lotions/deodorants the morning of surgery.  Please wear clean clothes to the hospital/surgery center.  FAILURE TO FOLLOW THESE INSTRUCTIONS MAY RESULT IN THE CANCELLATION OF YOUR SURGERY PATIENT SIGNATURE_________________________________  NURSE SIGNATURE__________________________________  ________________________________________________________________________

## 2019-02-23 NOTE — Progress Notes (Signed)
Office Visit Note   Patient: Sabrina Parks           Date of Birth: 06-May-1941           MRN: [id number] Visit Date: 02/23/2019              Requested by: Marton Redwood, MD [address] [place],  [place] [id number] PCP: Marton Redwood, MD   Assessment & Plan: Visit Diagnoses:  1. Pain in right hip     Plan: Present pain is localized to the lateral aspect right hip.  Had good results with cortisone injection in the past.  Will repeat same injection and monitor response.  We checked with her urologist regarding the cortisone injection.  She is having surgery on Thursday and it was felt that that would not be a problem.  Follow-Up Instructions: Return if symptoms worsen or fail to improve.   Orders:  No orders of the defined types were placed in this encounter.  No orders of the defined types were placed in this encounter.     Procedures: Large Joint Inj: R greater trochanter on 02/23/2019 11:57 AM Indications: pain and diagnostic evaluation Details: 25 G 1.5 in needle  Arthrogram: No  Medications: 2 mL lidocaine 1 %; 2 mL bupivacaine 0.5 %; 80 mg methylPREDNISolone acetate 40 MG/ML Procedure, treatment alternatives, risks and benefits explained, specific risks discussed. Consent was given by the patient. Immediately prior to procedure a time out was called to verify the correct patient, procedure, equipment, support staff and site/side marked as required. Patient was prepped and draped in the usual sterile fashion.       Clinical Data: No additional findings.   Subjective: Chief Complaint  Patient presents with  . Right Hip - Pain  Patient presents today for her right hip. She was last seen on October of 2019 and received a trochanteric injection on the right side. Her pain is located in her right buttock and on the lateral side of her hip. She said that it has been hurting for 3 weeks. No known injury. She has pain occasionally that travels to her knee. No numbness or  tingling in her lower legs. She takes tylenol as needed. She sees Dr.Elsner for her back and wonders if she needs to see him again.  She had an epidural steroid injection which made a "big difference".  However, this particular pain is localized about the right hip where it was in the past.  She has had a prior MRI scan demonstrating mild degenerative changes of both hips but she is not experiencing groin pain.  Prior MRI scan of the pelvis in July 2019 did not demonstrate any specific pathology about the right hip except for the mild arthritis.  There was some partial tearing of the left gluteus minimus where she is not symptomatic  HPI  Review of Systems   Objective: Vital Signs: BP (!) 121/53   Pulse 85   Ht 5' 3.5" (1.613 m)   Wt 143 lb (64.9 kg)   LMP 07/29/1990   BMI 24.93 kg/m   Physical Exam Constitutional:      Appearance: She is well-developed.  Eyes:     Pupils: Pupils are equal, round, and reactive to light.  Pulmonary:     Effort: Pulmonary effort is normal.  Skin:    General: Skin is warm and dry.  Neurological:     Mental Status: She is alert and oriented to person, place, and time.  Psychiatric:  Behavior: Behavior normal.     Ortho Exam awake alert and oriented x3.  Comfortable sitting.  No pain with range of motion of her right hip.  No groin pain.  Does have some local tenderness over the greater trochanter of the straight leg raise negative  Specialty Comments:  No specialty comments available.  Imaging: No results found.   PMFS History: Patient Active Problem List   Diagnosis Date Noted  . Pain in right hip 02/23/2019  . 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 Lichen sclerosus et atrophicus 06/14/2016  . Hypothyroidism 06/14/2016  . Hypertension   . Hyperlipidemia   . TIA (transient ischemic attack) 06/12/2012  . Alopecia 08/01/2011   Past Medical History:  Diagnosis Date  . Alopecia   . Arthritis   . Complication of  anesthesia   . DJD (degenerative joint disease), cervical 5/02   Elsner  . HSV-1 infection   . Hyperlipidemia   . Hypertension   . Lichen sclerosus 49/17/9150   biopsy proven  . Liver hemangioma 2011   Dr. Earlean Shawl  . PONV (postoperative nausea and vomiting)   . Thyroiditis   . Transient global amnesia    not fully diag    Family History  Problem Relation Age of Onset  . Stroke Mother   . Cancer Father   . Colon cancer Father        89's    Past Surgical History:  Procedure Laterality Date  . APPENDECTOMY  1956  . CARDIOVASCULAR STRESS TEST  10/29/2006   EF 78%.   [place] [place] REPAIR N/A 12/29/2018   Procedure: exam under anesthesia;  Surgeon: Bjorn Loser, MD;  Location: [facility];  Service: Urology;  Laterality: N/A;  . LOOP RECORDER INSERTION N/A 05/01/2017   Procedure: LOOP RECORDER INSERTION;  Surgeon: Evans Lance, MD;  Location: [facility];  Service: Cardiovascular;  Laterality: N/A;  . ROTATOR CUFF REPAIR Bilateral   . SHOULDER SURGERY    . TONSILLECTOMY AND ADENOIDECTOMY  1957  . TOTAL ABDOMINAL HYSTERECTOMY W/ BILATERAL SALPINGOOPHORECTOMY  7/90  . US ECHOCARDIOGRAPHY  10/11/2009   EF 55-60%   Social History   Occupational History  . Not on file  Tobacco Use  . Smoking status: Former Smoker    Years: 10.00    Types: Cigarettes    Quit date: 01/01/1970    Years since quitting: 49.1  . Smokeless tobacco: Never Used  Substance and Sexual Activity  . Alcohol use: Yes    Comment: 1-2 glasses of wine   . Drug use: No  . Sexual activity: Yes    Partners: Male    Comment: hysterectomy

## 2019-02-24 ENCOUNTER — Encounter (HOSPITAL_COMMUNITY)
Admission: RE | Admit: 2019-02-24 | Discharge: 2019-02-24 | Disposition: A | Discharge status: Home / Self Care | Payer: Medicare Other | Source: Ambulatory Visit | Attending: Urology | Admitting: Urology

## 2019-02-24 ENCOUNTER — Other Ambulatory Visit: Payer: Self-pay

## 2019-02-24 ENCOUNTER — Encounter (HOSPITAL_COMMUNITY): Payer: Self-pay

## 2019-02-24 DIAGNOSIS — N736 Female pelvic peritoneal adhesions (postinfective): Secondary | ICD-10-CM | POA: Diagnosis not present

## 2019-02-24 DIAGNOSIS — Z79890 Hormone replacement therapy (postmenopausal): Secondary | ICD-10-CM | POA: Insufficient documentation

## 2019-02-24 DIAGNOSIS — E039 Hypothyroidism, unspecified: Secondary | ICD-10-CM | POA: Diagnosis not present

## 2019-02-24 DIAGNOSIS — I447 Left bundle-branch block, unspecified: Secondary | ICD-10-CM | POA: Insufficient documentation

## 2019-02-24 DIAGNOSIS — Z8673 Personal history of transient ischemic attack (TIA), and cerebral infarction without residual deficits: Secondary | ICD-10-CM | POA: Diagnosis not present

## 2019-02-24 DIAGNOSIS — N819 Female genital prolapse, unspecified: Secondary | ICD-10-CM | POA: Diagnosis not present

## 2019-02-24 DIAGNOSIS — I10 Essential (primary) hypertension: Secondary | ICD-10-CM | POA: Insufficient documentation

## 2019-02-24 DIAGNOSIS — Z79899 Other long term (current) drug therapy: Secondary | ICD-10-CM | POA: Insufficient documentation

## 2019-02-24 DIAGNOSIS — E785 Hyperlipidemia, unspecified: Secondary | ICD-10-CM | POA: Insufficient documentation

## 2019-02-24 DIAGNOSIS — Z87891 Personal history of nicotine dependence: Secondary | ICD-10-CM | POA: Insufficient documentation

## 2019-02-24 DIAGNOSIS — Z01818 Encounter for other preprocedural examination: Secondary | ICD-10-CM | POA: Insufficient documentation

## 2019-02-24 DIAGNOSIS — Z90710 Acquired absence of both cervix and uterus: Secondary | ICD-10-CM | POA: Diagnosis not present

## 2019-02-24 DIAGNOSIS — N8110 Cystocele, unspecified: Secondary | ICD-10-CM | POA: Insufficient documentation

## 2019-02-24 DIAGNOSIS — Z7982 Long term (current) use of aspirin: Secondary | ICD-10-CM | POA: Insufficient documentation

## 2019-02-24 DIAGNOSIS — Z85828 Personal history of other malignant neoplasm of skin: Secondary | ICD-10-CM | POA: Diagnosis not present

## 2019-02-24 DIAGNOSIS — M1990 Unspecified osteoarthritis, unspecified site: Secondary | ICD-10-CM | POA: Insufficient documentation

## 2019-02-24 DIAGNOSIS — K219 Gastro-esophageal reflux disease without esophagitis: Secondary | ICD-10-CM | POA: Diagnosis not present

## 2019-02-24 DIAGNOSIS — F419 Anxiety disorder, unspecified: Secondary | ICD-10-CM | POA: Diagnosis not present

## 2019-02-24 DIAGNOSIS — E7800 Pure hypercholesterolemia, unspecified: Secondary | ICD-10-CM | POA: Diagnosis not present

## 2019-02-24 LAB — COMPREHENSIVE METABOLIC PANEL
ALT: 56 U/L — ABNORMAL HIGH (ref 0–44)
AST: 47 U/L — ABNORMAL HIGH (ref 15–41)
Albumin: 4.6 g/dL (ref 3.5–5.0)
Alkaline Phosphatase: 102 U/L (ref 38–126)
Anion gap: 12 (ref 5–15)
BUN: 14 mg/dL (ref 8–23)
CO2: 24 mmol/L (ref 22–32)
Calcium: 9.7 mg/dL (ref 8.9–10.3)
Chloride: 92 mmol/L — ABNORMAL LOW (ref 98–111)
Creatinine, Ser: 0.6 mg/dL (ref 0.44–1.00)
GFR calc Af Amer: >60 mL/min (ref >60)
GFR calc non Af Amer: >60 mL/min (ref >60)
Glucose, Bld: 115 mg/dL — ABNORMAL HIGH (ref 70–99)
Potassium: 3.9 mmol/L (ref 3.5–5.1)
Sodium: 128 mmol/L — ABNORMAL LOW (ref 135–145)
Total Bilirubin: 0.7 mg/dL (ref 0.3–1.2)
Total Protein: 7.3 g/dL (ref 6.5–8.1)

## 2019-02-24 LAB — CBC
HCT: 38.8 % (ref 36.0–46.0)
Hemoglobin: 12.9 g/dL (ref 12.0–15.0)
MCH: 29.1 pg (ref 26.0–34.0)
MCHC: 33.2 g/dL (ref 30.0–36.0)
MCV: 87.4 fL (ref 80.0–100.0)
Platelets: 375 10*3/uL (ref 150–400)
RBC: 4.44 MIL/uL (ref 3.87–5.11)
RDW: 12.3 % (ref 11.5–15.5)
WBC: 16.9 10*3/uL — ABNORMAL HIGH (ref 4.0–10.5)
nRBC: 0 % (ref 0.0–0.2)

## 2019-02-24 LAB — TYPE AND SCREEN
ABO/RH(D): B POS
Antibody Screen: NEGATIVE

## 2019-02-24 NOTE — Progress Notes (Signed)
ASA was stopped 02/18/19 per Dr. Carlton Adam office

## 2019-02-24 NOTE — Progress Notes (Signed)
Anesthesia Chart Review   Case: [id number] Date/Time: 02/25/19 0102   Procedure: XI ROBOTIC ASSISTED LAPAROSCOPIC SACROCOLPOPEXY (N/A )   Anesthesia type: General   Pre-op diagnosis: CYSTOCELE   Location: [place] [facility] / [facility]   Surgeon: [name], MD      DISCUSSION:78 y.o. former smoker (quit 01/01/70) with h/o PONV, HTN, HLD, LBBB, loop recorder in place (05/01/2017),  cystocele scheduled for above procedure 02/25/2019 with Dr. Louis Meckel.   Pt last seen by cardiologist, Dr. Dorris Carnes, 12/18/2018 due to dizziness.  Per OV note, "Patient says she feels OK today.  The pt's spells sound vagal in nature Orthostatics are negative No syncope Today she feels fine Loop recorder shows no arrhythmia No bradycardia.  LBBB unchanged."  Pt asymptomatic at PAT visit.   Anticipate pt can proceed with planned procedure barring acute status change.    VS: BP 128/68   Pulse 85   Temp 37.2 C (Oral)   Resp 16   Ht 5\' 4"  (1.626 m)   Wt 66.4 kg   LMP 07/29/1990   SpO2 99%   BMI 25.13 kg/m   PROVIDERS: Marton Redwood, MD is PCP   Dorris Carnes, MD is Cardiologist  LABS: Sodium forwarded to surgeon, repeat DOS (all labs ordered are listed, but only abnormal results are displayed)  Labs Reviewed  CBC - Abnormal; Notable for the following components:      Result Value   WBC 16.9 (*)    All other components within normal limits  COMPREHENSIVE METABOLIC PANEL - Abnormal; Notable for the following components:   Sodium 128 (*)    Chloride 92 (*)    Glucose, Bld 115 (*)    AST 47 (*)    ALT 56 (*)    All other components within normal limits  URINE CULTURE  TYPE AND SCREEN     IMAGES: Carotid Doppler 06/13/2012 Summary: No significant extracranial carotid artery stenosis demonstrated. Vertebrals are patent with antegrade flow.  EKG: 12/18/2018 Rate 80 bpm Normal sinus rhythm  Nonspecific intraventricular block  T wave abnormality, consider inferior ischemia.    CV: Myocardial Perfusion 10/29/16  Nuclear stress EF: 68%.  There was no ST segment deviation noted during stress.  Defect 1: There is a small defect of moderate severity present in the apical septal location.   Low risk stress nuclear study with a fixed apicoseptal defect that appears most likely to be an artifact. Normal left ventricular regional and global systolic function. Past Medical History:  Diagnosis Date  . Alopecia   . Arthritis   . Complication of anesthesia   . DJD (degenerative joint disease), cervical 5/02   Elsner  . HSV-1 infection   . Hyperlipidemia   . Hypertension   . Lichen sclerosus 72/53/6644   biopsy proven  . Liver hemangioma 2011   Dr. Earlean Shawl  . PONV (postoperative nausea and vomiting)   . Thyroiditis   . Transient global amnesia    not fully diag    Past Surgical History:  Procedure Laterality Date  . APPENDECTOMY  1956  . CARDIOVASCULAR STRESS TEST  10/29/2006   EF 78%.   [facility] [place] REPAIR N/A 12/29/2018   Procedure: exam under anesthesia;  Surgeon: Bjorn Loser, MD;  Location: [facility];  Service: Urology;  Laterality: N/A;  . LOOP RECORDER INSERTION N/A 05/01/2017   Procedure: LOOP RECORDER INSERTION;  Surgeon: Evans Lance, MD;  Location: [facility];  Service: Cardiovascular;  Laterality: N/A;  . ROTATOR  CUFF REPAIR Bilateral   . SHOULDER SURGERY    . TONSILLECTOMY AND ADENOIDECTOMY  1957  . TOTAL ABDOMINAL HYSTERECTOMY W/ BILATERAL SALPINGOOPHORECTOMY  7/90  . US ECHOCARDIOGRAPHY  10/11/2009   EF 55-60%    MEDICATIONS: . acetaminophen (TYLENOL) 500 MG tablet  . amitriptyline (ELAVIL) 25 MG tablet  . aspirin EC 81 MG tablet  . Biotin 5000 MCG TABS  . cholecalciferol (VITAMIN D) 25 MCG (1000 UT) tablet  . clobetasol ointment (TEMOVATE) 0.05 %  . famotidine (PEPCID) 10 MG tablet  . hydrochlorothiazide (HYDRODIURIL) 12.5 MG tablet  . ibuprofen (ADVIL,MOTRIN) 200 MG tablet  . levothyroxine (SYNTHROID, LEVOTHROID) 50 MCG  tablet  . LORazepam (ATIVAN) 0.5 MG tablet  . nystatin cream (MYCOSTATIN)  . olmesartan (BENICAR) 40 MG tablet  . ondansetron (ZOFRAN) 4 MG tablet  . PROAIR HFA 108 (90 BASE) MCG/ACT inhaler  . rosuvastatin (CRESTOR) 10 MG tablet  . triamcinolone (NASACORT ALLERGY 24HR) 55 MCG/ACT AERO nasal inhaler   No current facility-administered medications for this encounter.      Konrad Felix, PA-C [facility] [phone] .02/24/19  1:05 PM

## 2019-02-24 NOTE — Anesthesia Preprocedure Evaluation (Addendum)
Anesthesia Evaluation  Patient identified by MRN, date of birth, ID band Patient awake    Reviewed: Allergy & Precautions, NPO status , Patient's Chart, lab work & pertinent test results  History of Anesthesia Complications (+) PONV, Emergence Delirium and history of anesthetic complications  Airway Mallampati: II  TM Distance: >3 FB Neck ROM: Full    Dental  (+) Dental Advisory Given   Pulmonary neg shortness of breath, neg sleep apnea, neg COPD, neg recent URI, former smoker,    breath sounds clear to auscultation       Cardiovascular hypertension, Pt. on medications (-) angina(-) Past MI and (-) CHF  Rhythm:Regular     Neuro/Psych TIAnegative psych ROS   GI/Hepatic Neg liver ROS, GERD  Medicated,  Endo/Other  Hypothyroidism   Renal/GU negative Renal ROS     Musculoskeletal  (+) Arthritis ,   Abdominal   Peds  Hematology negative hematology ROS (+)   Anesthesia Other Findings 2013 tte:  Left ventricle: The cavity size was normal. Wall thickness  was increased in a pattern of mild LVH. Systolic function  was vigorous. The estimated ejection fraction was in the  range of 65% to 70%. Wall motion was normal; there were no  regional wall motion abnormalities. Doppler parameters are  consistent with abnormal left ventricular relaxation  (grade 1 diastolic dysfunction).  - Aortic valve: There was no stenosis.  - Left atrium: The atrium was mildly dilated.  - Right ventricle: The cavity size was normal. Systolic  function was normal.  - Pulmonary arteries: PA peak pressure: 85mm Hg (S).  - Inferior vena cava: The vessel was normal in size; the  respirophasic diameter changes were in the normal range (=  50%); findings are consistent with normal central venous  pressure.  Impressions:   2018 neg stress  Reproductive/Obstetrics                           Anesthesia  Physical Anesthesia Plan  ASA: II  Anesthesia Plan: General   Post-op Pain Management:    Induction: Intravenous  PONV Risk Score and Plan: 4 or greater and Ondansetron, Dexamethasone, Propofol infusion and TIVA  Airway Management Planned: Oral ETT  Additional Equipment: None  Intra-op Plan:   Post-operative Plan: Extubation in OR  Informed Consent: I have reviewed the patients History and Physical, chart, labs and discussed the procedure including the risks, benefits and alternatives for the proposed anesthesia with the patient or authorized representative who has indicated his/her understanding and acceptance.     Dental advisory given  Plan Discussed with: CRNA and Surgeon  Anesthesia Plan Comments: (See PAT note 02/24/2019, Konrad Felix, PA-C)      Anesthesia Quick Evaluation

## 2019-02-25 ENCOUNTER — Ambulatory Visit (HOSPITAL_COMMUNITY): Payer: Medicare Other | Admitting: Anesthesiology

## 2019-02-25 ENCOUNTER — Ambulatory Visit (HOSPITAL_COMMUNITY): Payer: Medicare Other | Admitting: Physician Assistant

## 2019-02-25 ENCOUNTER — Observation Stay (HOSPITAL_COMMUNITY)
Admission: RE | Admit: 2019-02-25 | Discharge: 2019-02-26 | Disposition: A | Discharge status: Home / Self Care | Payer: Medicare Other | Attending: Urology | Admitting: Urology

## 2019-02-25 ENCOUNTER — Encounter (HOSPITAL_COMMUNITY)
Admission: RE | Disposition: A | Discharge status: Home / Self Care | Payer: Self-pay | Source: Home / Self Care | Attending: Urology

## 2019-02-25 ENCOUNTER — Encounter (HOSPITAL_COMMUNITY): Payer: Self-pay

## 2019-02-25 DIAGNOSIS — N736 Female pelvic peritoneal adhesions (postinfective): Secondary | ICD-10-CM | POA: Insufficient documentation

## 2019-02-25 DIAGNOSIS — N8110 Cystocele, unspecified: Secondary | ICD-10-CM | POA: Diagnosis not present

## 2019-02-25 DIAGNOSIS — E7800 Pure hypercholesterolemia, unspecified: Secondary | ICD-10-CM | POA: Insufficient documentation

## 2019-02-25 DIAGNOSIS — F419 Anxiety disorder, unspecified: Secondary | ICD-10-CM | POA: Insufficient documentation

## 2019-02-25 DIAGNOSIS — K219 Gastro-esophageal reflux disease without esophagitis: Secondary | ICD-10-CM | POA: Diagnosis not present

## 2019-02-25 DIAGNOSIS — Z79890 Hormone replacement therapy (postmenopausal): Secondary | ICD-10-CM | POA: Insufficient documentation

## 2019-02-25 DIAGNOSIS — Z8673 Personal history of transient ischemic attack (TIA), and cerebral infarction without residual deficits: Secondary | ICD-10-CM | POA: Insufficient documentation

## 2019-02-25 DIAGNOSIS — N819 Female genital prolapse, unspecified: Principal | ICD-10-CM | POA: Diagnosis present

## 2019-02-25 DIAGNOSIS — N8111 Cystocele, midline: Secondary | ICD-10-CM | POA: Diagnosis not present

## 2019-02-25 DIAGNOSIS — N8189 Other female genital prolapse: Secondary | ICD-10-CM | POA: Diagnosis not present

## 2019-02-25 DIAGNOSIS — I10 Essential (primary) hypertension: Secondary | ICD-10-CM | POA: Insufficient documentation

## 2019-02-25 DIAGNOSIS — Z85828 Personal history of other malignant neoplasm of skin: Secondary | ICD-10-CM | POA: Insufficient documentation

## 2019-02-25 DIAGNOSIS — Z87891 Personal history of nicotine dependence: Secondary | ICD-10-CM | POA: Insufficient documentation

## 2019-02-25 DIAGNOSIS — Z79899 Other long term (current) drug therapy: Secondary | ICD-10-CM | POA: Diagnosis not present

## 2019-02-25 DIAGNOSIS — Z90710 Acquired absence of both cervix and uterus: Secondary | ICD-10-CM | POA: Diagnosis not present

## 2019-02-25 DIAGNOSIS — E039 Hypothyroidism, unspecified: Secondary | ICD-10-CM | POA: Insufficient documentation

## 2019-02-25 HISTORY — PX: ROBOTIC ASSISTED LAPAROSCOPIC SACROCOLPOPEXY: SHX5388

## 2019-02-25 LAB — BASIC METABOLIC PANEL
Anion gap: 10 (ref 5–15)
BUN: 11 mg/dL (ref 8–23)
CO2: 25 mmol/L (ref 22–32)
Calcium: 10 mg/dL (ref 8.9–10.3)
Chloride: 96 mmol/L — ABNORMAL LOW (ref 98–111)
Creatinine, Ser: 0.62 mg/dL (ref 0.44–1.00)
GFR calc Af Amer: >60 mL/min (ref >60)
GFR calc non Af Amer: >60 mL/min (ref >60)
Glucose, Bld: 111 mg/dL — ABNORMAL HIGH (ref 70–99)
Potassium: 3.9 mmol/L (ref 3.5–5.1)
Sodium: 131 mmol/L — ABNORMAL LOW (ref 135–145)

## 2019-02-25 LAB — URINE CULTURE: Culture: NO GROWTH

## 2019-02-25 LAB — HEMOGLOBIN AND HEMATOCRIT, BLOOD
HCT: 35.5 % — ABNORMAL LOW (ref 36.0–46.0)
Hemoglobin: 11.9 g/dL — ABNORMAL LOW (ref 12.0–15.0)

## 2019-02-25 SURGERY — SACROCOLPOPEXY, ROBOT-ASSISTED, LAPAROSCOPIC
Anesthesia: General

## 2019-02-25 MED ORDER — SUFENTANIL CITRATE 50 MCG/ML IV SOLN
INTRAVENOUS | Status: AC
  Filled 2019-02-25: qty 1

## 2019-02-25 MED ORDER — OXYCODONE HCL 5 MG/5ML PO SOLN: 5.0000 mg | Freq: Once | ORAL | Status: DC | PRN

## 2019-02-25 MED ORDER — FAMOTIDINE 10 MG PO TABS: 10.0000 mg | ORAL_TABLET | Freq: Every day | ORAL | Status: DC

## 2019-02-25 MED ORDER — DEXAMETHASONE SODIUM PHOSPHATE 10 MG/ML IJ SOLN
INTRAMUSCULAR | Status: DC | PRN
  Administered 2019-02-25: 6 mg via INTRAVENOUS

## 2019-02-25 MED ORDER — ACETAMINOPHEN 10 MG/ML IV SOLN: 1000.0000 mg | Freq: Once | INTRAVENOUS | Status: DC | PRN

## 2019-02-25 MED ORDER — AMITRIPTYLINE HCL 25 MG PO TABS
25.0000 mg | ORAL_TABLET | Freq: Every day | ORAL | Status: DC
  Administered 2019-02-25: 25 mg via ORAL
  Filled 2019-02-25: qty 1

## 2019-02-25 MED ORDER — KETOROLAC TROMETHAMINE 15 MG/ML IJ SOLN
15.0000 mg | Freq: Four times a day (QID) | INTRAMUSCULAR | Status: DC
  Administered 2019-02-25 (×2): 15 mg via INTRAVENOUS
  Filled 2019-02-25 (×2): qty 1

## 2019-02-25 MED ORDER — SUGAMMADEX SODIUM 200 MG/2ML IV SOLN
INTRAVENOUS | Status: DC | PRN
  Administered 2019-02-25: 140 mg via INTRAVENOUS

## 2019-02-25 MED ORDER — BUPIVACAINE-EPINEPHRINE 0.5% -1:200000 IJ SOLN
INTRAMUSCULAR | Status: DC | PRN
  Administered 2019-02-25: 10 mL

## 2019-02-25 MED ORDER — CEFAZOLIN SODIUM-DEXTROSE 2-4 GM/100ML-% IV SOLN
2.0000 g | INTRAVENOUS | Status: AC
  Administered 2019-02-25: 2 g via INTRAVENOUS
  Filled 2019-02-25: qty 100

## 2019-02-25 MED ORDER — PROPOFOL 10 MG/ML IV BOLUS
INTRAVENOUS | Status: DC | PRN
  Administered 2019-02-25: 100 mg via INTRAVENOUS

## 2019-02-25 MED ORDER — KETAMINE HCL 10 MG/ML IJ SOLN
INTRAMUSCULAR | Status: AC
  Filled 2019-02-25: qty 1

## 2019-02-25 MED ORDER — FENTANYL CITRATE (PF) 100 MCG/2ML IJ SOLN: 25.0000 ug | INTRAMUSCULAR | Status: DC | PRN

## 2019-02-25 MED ORDER — LIDOCAINE 2% (20 MG/ML) 5 ML SYRINGE
INTRAMUSCULAR | Status: AC
  Filled 2019-02-25: qty 5

## 2019-02-25 MED ORDER — ONDANSETRON HCL 4 MG/2ML IJ SOLN
INTRAMUSCULAR | Status: AC
  Filled 2019-02-25: qty 2

## 2019-02-25 MED ORDER — ESTRADIOL 0.1 MG/GM VA CREA
TOPICAL_CREAM | VAGINAL | Status: DC | PRN
  Administered 2019-02-25: 1 via VAGINAL

## 2019-02-25 MED ORDER — SODIUM CHLORIDE 0.9 % IV SOLN
Freq: Once | INTRAVENOUS | Status: AC
  Administered 2019-02-25: 07:00:00 via INTRAVENOUS

## 2019-02-25 MED ORDER — KETOROLAC TROMETHAMINE 15 MG/ML IJ SOLN
INTRAMUSCULAR | Status: AC
  Administered 2019-02-25: 15 mg
  Filled 2019-02-25: qty 1

## 2019-02-25 MED ORDER — ACETAMINOPHEN 500 MG PO TABS: 1000.0000 mg | ORAL_TABLET | Freq: Once | ORAL | Status: DC | PRN

## 2019-02-25 MED ORDER — DEXAMETHASONE SODIUM PHOSPHATE 10 MG/ML IJ SOLN
INTRAMUSCULAR | Status: AC
  Filled 2019-02-25: qty 1

## 2019-02-25 MED ORDER — HYDROMORPHONE HCL 1 MG/ML IJ SOLN
INTRAMUSCULAR | Status: AC
  Filled 2019-02-25: qty 1

## 2019-02-25 MED ORDER — LORAZEPAM 0.5 MG PO TABS: 0.5000 mg | ORAL_TABLET | Freq: Every day | ORAL | Status: DC | PRN

## 2019-02-25 MED ORDER — PHENYLEPHRINE 40 MCG/ML (10ML) SYRINGE FOR IV PUSH (FOR BLOOD PRESSURE SUPPORT)
PREFILLED_SYRINGE | INTRAVENOUS | Status: AC
  Filled 2019-02-25: qty 10

## 2019-02-25 MED ORDER — SODIUM CHLORIDE (PF) 0.9 % IJ SOLN
INTRAMUSCULAR | Status: AC
  Filled 2019-02-25: qty 10

## 2019-02-25 MED ORDER — DIPHENHYDRAMINE HCL 50 MG/ML IJ SOLN: 12.5000 mg | Freq: Four times a day (QID) | INTRAMUSCULAR | Status: DC | PRN

## 2019-02-25 MED ORDER — FAMOTIDINE 20 MG PO TABS
10.0000 mg | ORAL_TABLET | Freq: Every day | ORAL | Status: DC
  Administered 2019-02-25 – 2019-02-26 (×2): 10 mg via ORAL
  Filled 2019-02-25 (×2): qty 1

## 2019-02-25 MED ORDER — SODIUM CHLORIDE (PF) 0.9 % IJ SOLN
INTRAMUSCULAR | Status: AC
  Filled 2019-02-25: qty 20

## 2019-02-25 MED ORDER — SODIUM CHLORIDE 0.9 % IV SOLN
INTRAVENOUS | Status: DC | PRN
  Administered 2019-02-25 (×2): via INTRAVENOUS

## 2019-02-25 MED ORDER — ESTRADIOL 0.1 MG/GM VA CREA
TOPICAL_CREAM | VAGINAL | Status: AC
  Filled 2019-02-25: qty 42.5

## 2019-02-25 MED ORDER — HYDROMORPHONE HCL 2 MG/ML IJ SOLN
INTRAMUSCULAR | Status: AC
  Filled 2019-02-25: qty 1

## 2019-02-25 MED ORDER — PROPOFOL 10 MG/ML IV BOLUS
INTRAVENOUS | Status: AC
  Filled 2019-02-25: qty 20

## 2019-02-25 MED ORDER — LEVOTHYROXINE SODIUM 50 MCG PO TABS
50.0000 ug | ORAL_TABLET | Freq: Every day | ORAL | Status: DC
  Administered 2019-02-26: 50 ug via ORAL
  Filled 2019-02-25: qty 1

## 2019-02-25 MED ORDER — LIDOCAINE 2% (20 MG/ML) 5 ML SYRINGE
INTRAMUSCULAR | Status: DC | PRN
  Administered 2019-02-25: 60 mg via INTRAVENOUS

## 2019-02-25 MED ORDER — CEFAZOLIN SODIUM-DEXTROSE 1-4 GM/50ML-% IV SOLN
1.0000 g | Freq: Three times a day (TID) | INTRAVENOUS | Status: AC
  Administered 2019-02-25 – 2019-02-26 (×2): 1 g via INTRAVENOUS
  Filled 2019-02-25 (×3): qty 50

## 2019-02-25 MED ORDER — SENNOSIDES-DOCUSATE SODIUM 8.6-50 MG PO TABS
2.0000 | ORAL_TABLET | Freq: Every day | ORAL | Status: DC
  Administered 2019-02-25: 2 via ORAL
  Filled 2019-02-25: qty 2

## 2019-02-25 MED ORDER — OXYBUTYNIN CHLORIDE 5 MG PO TABS: 5.0000 mg | ORAL_TABLET | Freq: Three times a day (TID) | ORAL | Status: DC | PRN

## 2019-02-25 MED ORDER — OXYCODONE HCL 5 MG PO TABS: 5.0000 mg | ORAL_TABLET | Freq: Once | ORAL | Status: DC | PRN

## 2019-02-25 MED ORDER — TRAMADOL HCL 50 MG PO TABS: 50.0000 mg | ORAL_TABLET | Freq: Four times a day (QID) | ORAL | 0 refills | Status: DC | PRN

## 2019-02-25 MED ORDER — HYDROMORPHONE HCL 1 MG/ML IJ SOLN
0.5000 mg | INTRAMUSCULAR | Status: DC | PRN
  Administered 2019-02-25 (×2): 0.5 mg via INTRAVENOUS

## 2019-02-25 MED ORDER — ALBUTEROL SULFATE (2.5 MG/3ML) 0.083% IN NEBU: 2.5000 mg | INHALATION_SOLUTION | Freq: Four times a day (QID) | RESPIRATORY_TRACT | Status: DC | PRN

## 2019-02-25 MED ORDER — LACTATED RINGERS IR SOLN
Status: DC | PRN
  Administered 2019-02-25: 1000 mL

## 2019-02-25 MED ORDER — HYDROCHLOROTHIAZIDE 25 MG PO TABS
12.5000 mg | ORAL_TABLET | Freq: Every day | ORAL | Status: DC
  Administered 2019-02-26: 12.5 mg via ORAL
  Filled 2019-02-25: qty 1

## 2019-02-25 MED ORDER — STERILE WATER FOR IRRIGATION IR SOLN
Status: DC | PRN
  Administered 2019-02-25: 1000 mL

## 2019-02-25 MED ORDER — LACTATED RINGERS IV SOLN: INTRAVENOUS | Status: DC

## 2019-02-25 MED ORDER — FLEET ENEMA 7-19 GM/118ML RE ENEM
1.0000 | ENEMA | Freq: Once | RECTAL | Status: AC
  Administered 2019-02-25: 1 via RECTAL
  Filled 2019-02-25: qty 1

## 2019-02-25 MED ORDER — HYDROMORPHONE HCL 1 MG/ML IJ SOLN
INTRAMUSCULAR | Status: DC | PRN
  Administered 2019-02-25: 0.5 mg via INTRAVENOUS

## 2019-02-25 MED ORDER — ACETAMINOPHEN 160 MG/5ML PO SOLN: 1000.0000 mg | Freq: Once | ORAL | Status: DC | PRN

## 2019-02-25 MED ORDER — CIPROFLOXACIN IN D5W 400 MG/200ML IV SOLN
400.0000 mg | INTRAVENOUS | Status: AC
  Administered 2019-02-25: 400 mg via INTRAVENOUS
  Filled 2019-02-25: qty 200

## 2019-02-25 MED ORDER — ONDANSETRON HCL 4 MG/2ML IJ SOLN
4.0000 mg | INTRAMUSCULAR | Status: DC | PRN
  Administered 2019-02-25 (×2): 4 mg via INTRAVENOUS
  Filled 2019-02-25: qty 2

## 2019-02-25 MED ORDER — PHENYLEPHRINE 40 MCG/ML (10ML) SYRINGE FOR IV PUSH (FOR BLOOD PRESSURE SUPPORT)
PREFILLED_SYRINGE | INTRAVENOUS | Status: DC | PRN
  Administered 2019-02-25: 40 ug via INTRAVENOUS

## 2019-02-25 MED ORDER — ACETAMINOPHEN 500 MG PO TABS
1000.0000 mg | ORAL_TABLET | Freq: Four times a day (QID) | ORAL | Status: AC
  Administered 2019-02-25 – 2019-02-26 (×4): 1000 mg via ORAL
  Filled 2019-02-25 (×4): qty 2

## 2019-02-25 MED ORDER — ROCURONIUM BROMIDE 10 MG/ML (PF) SYRINGE
PREFILLED_SYRINGE | INTRAVENOUS | Status: AC
  Filled 2019-02-25: qty 10

## 2019-02-25 MED ORDER — ROCURONIUM BROMIDE 10 MG/ML (PF) SYRINGE
PREFILLED_SYRINGE | INTRAVENOUS | Status: DC | PRN
  Administered 2019-02-25: 50 mg via INTRAVENOUS
  Administered 2019-02-25: 20 mg via INTRAVENOUS
  Administered 2019-02-25: 5 mg via INTRAVENOUS
  Administered 2019-02-25: 10 mg via INTRAVENOUS

## 2019-02-25 MED ORDER — PROMETHAZINE HCL 25 MG/ML IJ SOLN
INTRAMUSCULAR | Status: AC
  Filled 2019-02-25: qty 1

## 2019-02-25 MED ORDER — BUPIVACAINE-EPINEPHRINE 0.5% -1:200000 IJ SOLN
INTRAMUSCULAR | Status: AC
  Filled 2019-02-25: qty 1

## 2019-02-25 MED ORDER — BUPIVACAINE LIPOSOME 1.3 % IJ SUSP
20.0000 mL | Freq: Once | INTRAMUSCULAR | Status: AC
  Administered 2019-02-25: 20 mL
  Filled 2019-02-25: qty 20

## 2019-02-25 MED ORDER — SODIUM CHLORIDE 0.9 % IV SOLN
INTRAVENOUS | Status: DC
  Administered 2019-02-25 – 2019-02-26 (×2): via INTRAVENOUS

## 2019-02-25 MED ORDER — ONDANSETRON HCL 4 MG/2ML IJ SOLN
INTRAMUSCULAR | Status: DC | PRN
  Administered 2019-02-25 (×2): 4 mg via INTRAVENOUS

## 2019-02-25 MED ORDER — DIPHENHYDRAMINE HCL 12.5 MG/5ML PO ELIX: 12.5000 mg | ORAL_SOLUTION | Freq: Four times a day (QID) | ORAL | Status: DC | PRN

## 2019-02-25 MED ORDER — SUFENTANIL CITRATE 50 MCG/ML IV SOLN
INTRAVENOUS | Status: DC | PRN
  Administered 2019-02-25 (×3): 5 ug via INTRAVENOUS
  Administered 2019-02-25: 10 ug via INTRAVENOUS
  Administered 2019-02-25: 5 ug via INTRAVENOUS
  Administered 2019-02-25 (×2): 10 ug via INTRAVENOUS

## 2019-02-25 MED ORDER — PROMETHAZINE HCL 25 MG/ML IJ SOLN
6.2500 mg | INTRAMUSCULAR | Status: DC | PRN
  Administered 2019-02-25: 12.5 mg via INTRAVENOUS

## 2019-02-25 MED ORDER — ROSUVASTATIN CALCIUM 10 MG PO TABS
10.0000 mg | ORAL_TABLET | Freq: Every day | ORAL | Status: DC
  Administered 2019-02-25: 10 mg via ORAL
  Filled 2019-02-25: qty 1

## 2019-02-25 SURGICAL SUPPLY — 65 items
ADH SKN CLS APL DERMABOND .7 (GAUZE/BANDAGES/DRESSINGS) ×1
APL PRP STRL LF DISP 70% ISPRP (MISCELLANEOUS) ×1
BAG SPEC RTRVL LRG 6X4 10 (ENDOMECHANICALS)
CATH FOLEY 3WAY  5CC 16FR (CATHETERS) ×1
CATH FOLEY 3WAY 5CC 16FR (CATHETERS) IMPLANT
CHLORAPREP W/TINT 26 (MISCELLANEOUS) ×2 IMPLANT
CLIP VESOLOCK LG 6/CT PURPLE (CLIP) ×2 IMPLANT
CLIP VESOLOCK MED LG 6/CT (CLIP) IMPLANT
COVER SURGICAL LIGHT HANDLE (MISCELLANEOUS) ×2 IMPLANT
COVER TIP SHEARS 8 DVNC (MISCELLANEOUS) ×1 IMPLANT
COVER TIP SHEARS 8MM DA VINCI (MISCELLANEOUS) ×1
COVER WAND RF STERILE (DRAPES) IMPLANT
DERMABOND ADVANCED (GAUZE/BANDAGES/DRESSINGS) ×1
DERMABOND ADVANCED .7 DNX12 (GAUZE/BANDAGES/DRESSINGS) ×1 IMPLANT
DRAIN CHANNEL RND F F (WOUND CARE) IMPLANT
DRAPE ARM DVNC X/XI (DISPOSABLE) ×4 IMPLANT
DRAPE COLUMN DVNC XI (DISPOSABLE) ×1 IMPLANT
DRAPE DA VINCI XI ARM (DISPOSABLE) ×4
DRAPE DA VINCI XI COLUMN (DISPOSABLE) ×1
DRAPE INCISE IOBAN 66X45 STRL (DRAPES) ×2 IMPLANT
DRAPE SHEET LG 3/4 BI-LAMINATE (DRAPES) ×4 IMPLANT
DRAPE SURG IRRIG POUCH 19X23 (DRAPES) ×2 IMPLANT
ELECT PENCIL ROCKER SW 15FT (MISCELLANEOUS) ×2 IMPLANT
ELECT REM PT RETURN 15FT ADLT (MISCELLANEOUS) ×2 IMPLANT
GAUZE 4X4 16PLY RFD (DISPOSABLE) IMPLANT
GLOVE BIO SURGEON STRL SZ 6.5 (GLOVE) ×2 IMPLANT
GLOVE BIOGEL M STRL SZ7.5 (GLOVE) ×7 IMPLANT
GOWN STRL REUS W/ TWL XL LVL3 (GOWN DISPOSABLE) ×2 IMPLANT
GOWN STRL REUS W/TWL LRG LVL3 (GOWN DISPOSABLE) ×2 IMPLANT
GOWN STRL REUS W/TWL XL LVL3 (GOWN DISPOSABLE) ×4
HOLDER FOLEY CATH W/STRAP (MISCELLANEOUS) ×2 IMPLANT
IRRIG SUCT STRYKERFLOW 2 WTIP (MISCELLANEOUS) ×2
IRRIGATION SUCT STRKRFLW 2 WTP (MISCELLANEOUS) ×1 IMPLANT
KIT BASIN OR (CUSTOM PROCEDURE TRAY) ×2 IMPLANT
KIT TURNOVER KIT A (KITS) IMPLANT
MANIPULATOR UTERINE 4.5 ZUMI (MISCELLANEOUS) IMPLANT
MARKER SKIN DUAL TIP RULER LAB (MISCELLANEOUS) ×2 IMPLANT
MESH Y UPSYLON VAGINAL (Mesh General) ×1 IMPLANT
OCCLUDER COLPOPNEUMO (BALLOONS) IMPLANT
PACKING VAGINAL (PACKING) IMPLANT
PAD POSITIONING PINK XL (MISCELLANEOUS) ×2 IMPLANT
PLUG CATH AND CAP STER (CATHETERS) ×2 IMPLANT
PORT ACCESS TROCAR AIRSEAL 12 (TROCAR) ×1 IMPLANT
PORT ACCESS TROCAR AIRSEAL 5M (TROCAR)
POUCH SPECIMEN RETRIEVAL 10MM (ENDOMECHANICALS) IMPLANT
SEAL CANN UNIV 5-8 DVNC XI (MISCELLANEOUS) ×4 IMPLANT
SEAL XI 5MM-8MM UNIVERSAL (MISCELLANEOUS) ×4
SET IRRIG Y TYPE TUR BLADDER L (SET/KITS/TRAYS/PACK) ×1 IMPLANT
SET TRI-LUMEN FLTR TB AIRSEAL (TUBING) ×1 IMPLANT
SHEET LAVH (DRAPES) ×2 IMPLANT
SOLUTION ELECTROLUBE (MISCELLANEOUS) ×2 IMPLANT
SUT MNCRL AB 4-0 PS2 18 (SUTURE) ×4 IMPLANT
SUT PROLENE 2 0 CT 1 (SUTURE) ×2 IMPLANT
SUT VIC AB 0 CT1 27 (SUTURE) ×2
SUT VIC AB 0 CT1 27XBRD ANTBC (SUTURE) ×1 IMPLANT
SUT VIC AB 2-0 SH 27 (SUTURE) ×12
SUT VIC AB 2-0 SH 27XBRD (SUTURE) ×5 IMPLANT
SUT VIC AB 3-0 SH 27 (SUTURE) ×4
SUT VIC AB 3-0 SH 27X BRD (SUTURE) IMPLANT
SUT VICRYL 0 UR6 27IN ABS (SUTURE) ×2 IMPLANT
SYR 50ML LL SCALE MARK (SYRINGE) IMPLANT
SYR BULB IRRIGATION 50ML (SYRINGE) IMPLANT
TOWEL OR 17X26 10 PK STRL BLUE (TOWEL DISPOSABLE) ×3 IMPLANT
TRAY LAPAROSCOPIC (CUSTOM PROCEDURE TRAY) ×2 IMPLANT
TROCAR XCEL 12X100 BLDLESS (ENDOMECHANICALS) ×1 IMPLANT

## 2019-02-25 NOTE — Transfer of Care (Signed)
Immediate Anesthesia Transfer of Care Note  Patient: Bryant Lipps  Procedure(s) Performed: XI ROBOTIC ASSISTED LAPAROSCOPIC SACROCOLPOPEXY (N/A )  Patient Location: PACU  Anesthesia Type:General  Level of Consciousness: awake  Airway & Oxygen Therapy: Patient Spontanous Breathing and Patient connected to face mask oxygen  Post-op Assessment: Report given to RN and Post -op Vital signs reviewed and stable  Post vital signs: Reviewed and stable  Last Vitals:  Vitals Value Taken Time  BP 146/74 02/25/19 1116  Temp    Pulse 91 02/25/19 1118  Resp 13 02/25/19 1118  SpO2 100 % 02/25/19 1118  Vitals shown include unvalidated device data.  Last Pain:  Vitals:   02/25/19 0616  TempSrc:   PainSc: 0-No pain         Complications: No apparent anesthesia complications

## 2019-02-25 NOTE — Anesthesia Procedure Notes (Signed)
Procedure Name: Intubation Date/Time: 02/25/2019 7:35 AM Performed by: Sharlette Dense, CRNA Patient Re-evaluated:Patient Re-evaluated prior to induction Oxygen Delivery Method: Circle system utilized Preoxygenation: Pre-oxygenation with 100% oxygen Induction Type: IV induction Ventilation: Mask ventilation without difficulty and Oral airway inserted - appropriate to patient size Laryngoscope Size: Glidescope and 3 Grade View: Grade I Tube type: Oral Tube size: 7.0 mm Number of attempts: 1 Airway Equipment and Method: Stylet and Video-laryngoscopy Placement Confirmation: ETT inserted through vocal cords under direct vision,  positive ETCO2 and breath sounds checked- equal and bilateral Secured at: 20 cm Tube secured with: Tape Dental Injury: Teeth and Oropharynx as per pre-operative assessment  Comments: Glidescope used because patient had complained of days of hoarseness in her voice after last intubation.  Cords appeared normal on visualization

## 2019-02-25 NOTE — Op Note (Signed)
Preoperative diagnosis:  1. Pelvic organ prolapse   Postoperative diagnosis:  1. same   Procedure: 1. Robotic assisted laparoscopic sacrocolpopexy  Surgeon: [name], MD 1st assistant: Debbrah Alar, PA-C Resident Surgeon: Tharon Aquas, MD  Anesthesia: General  Complications: None  Intraoperative findings: [employer] U[employer] Y-Mesh placed, cut to specific vaginal length  EBL: 50cc  Specimens: None  Indication: Sabrina Parks is a 78 y.o. female patient with symptomatic pelvic organ prolapse.  After reviewing the management options for treatment, he elected to proceed with the above surgical procedure(s). We have discussed the potential benefits and risks of the procedure, side effects of the proposed treatment, the likelihood of the patient achieving the goals of the procedure, and any potential problems that might occur during the procedure or recuperation. Informed consent has been obtained.  Description of procedure:  A Foley catheter was then placed and placed to gravity drainage. I then made a periumbilical incision carrying the dissection down to the patient's fascia with electrocautery. Once to the fascia, the fascia was incised the peritoneum opened. A 97mm port was then placed into the abdomen. The abdomen was insufflated and the remaining ports placed under digital guidance. 2 ports were placed lateral to the umbilicus on the right proximally 10 cm apart. The most lateral port was approximately 3 cm above the anterior iliac spine. 2 additional ports were placed in the patient's right side in comparable positions to the most lateral port on the right was a 12 mm port.the robot was then docked at an angle from the leg obliquely along the side of the left leg.  We then began our surgery by cleaning up some of the pelvic adhesions to the small bowel and colon. Once this was completed I started dissecting at the sacral promontory located 3 cm medial to the  location where the ureter crosses over the iliac vessels at the pelvic brim. The posterior peritoneum was incised and the sacral prominence cleared off an area taking care to avoid the middle sacral vessels and the iliac branches. I then created a posterior peritoneal tunnel starting at the sacral promontory and tunneling down the right pelvic sidewall down into the pelvis breaking back through the posterior peritoneum around the vesico-vaginal junction posteriorly. I then continued the posterior dissection retracting down on the rectum and finding the avascular plane between the posterior vaginal wall and the rectum. I carried this dissection down as far as I could to along the area of the perineal body.  I then turned my attention to the anterior plane between the anterior vaginal wall and the bladder. I was able to obtain access to the avascular plane and with a combination of both monopolar cautery and blunt dissection was able to clean and nice down to the bladder neck. I then turned my attention back to the patient's uterus and skeletonized the right uterine artery and vein and then took this with a series of bipolar moves. I then performed a similar uterus pedicle ligation on the left.   Mesh was measured at approximately 6 cm anteriorly and 6 cm posteriorly and I cut this on the back table. The mesh was then placed into the patient's abdomen through the assistant port and the anterior leaf was secured down onto the anterior vaginal wall with the apex at the bladder neck. The posterior leaf was then secured down on the posterior vaginal wall. These were sewn down with 2-0 Vicryl. Between 6 and 8 were done on each side. At  this point I then went back to the previously dissected sacral promontory and posterior peritoneal tunnel and inserted a instrument through the tunnel and grasped the end of the mesh at the vaginal cuff and pull it up to the sacrum. I then checked to ensure that the sacral  mesh was not too tight by performing a vaginal exam. I then secured the sacral leg of the mesh using a 0 Prolene. I then reapproximated the posterior peritoneum with a 2-0 Vicryl in a running fashion around the sacral promontory. The pelvic peritoneum was closed using a pursestring. The fascia of the 12 mm port was then closed with 0 Vicryl in a figure-of-eight fashion. The skin was closed with 4-0 Monocryl's. Dermabond was applied to the incision and exparel injected into the incisions.  The patient was subsequently extubated and returned to the PACU in excellent condition.   [name], M.D.

## 2019-02-25 NOTE — H&P (Signed)
Pt presents today for pre-operative history and physical exam in anticipation of robotic assisted sacrocolpopexy by Dr. Louis Meckel on 02/25/19. She is doing well and is without complaint. Pt denies F/C, HA, CP, SOB, N/V, diarrhea, flank pain, hematuria, and dysuria.    HX:     CC: Pelvic organ prolapse  HPI: Sabrina Parks is a 78 year-old female established patient who is here for further evaluation of her pelvic prolapse.  PAtient seen today as consultation from Dr. Matilde Sprang for consideration of robotic sacrocolpopexy. She was taken to the OR for vaginal vault suspension yesterday and was noted to have a short anterior prolapse with a narrow pelvic inlet. Dr. Matilde Sprang thought that he couldn't repair her prolapse adequately via the transvaginal approach. He aborted the case with the notion that the robotic approach would be a better approach for her.   She is not having a lot of discomfort currently, but is eager to get her surgery done ASAP.   She first noticed her pelvic prolapse approximately 09/27/2018. She does have pelvic pain. Her symptoms have been stable over the last year.   She does not have an abnormal sensation when she needs to urinate. She is not urinating more frequently now than usual. She does have a good size and strength to her urinary stream. She is having problems with emptying her bladder well.   She is having problems with urinary control or incontinence. She does not wear protective pads. The patient does not wear pads. She can get to the bathroom in time when she gets the urge to urinate. She does not leak urine when she coughs, laughs, sneezes or bears down. The patient does not have a history of recurrent UTIs.   The patient does not posture when she voids or defecates. The patient does not have trouble with constipation.   The patient has a history of hysterectomy.     ALLERGIES: morphine - Nausea, Itching sulfa - Skin Rash    MEDICATIONS: Hydrochlorothiazide   Align  Amitriptyline Hcl  Aspirin Ec 81 mg tablet, delayed release  Claritin  Olmesartan Medoxomil  Pepcid Ac 10 mg tablet  Rosuvastatin Calcium  Vitamin D3     GU PSH: Complex cystometrogram, w/ void pressure and urethral pressure profile studies, any technique - 11/03/2018 Complex Uroflow - 11/03/2018 Emg surf Electrd - 11/03/2018 Hysterectomy Inject For cystogram - 11/03/2018 Intrabd voidng Press - 11/03/2018 Pelvic Exam Under Anesthesia - 12/29/2018       PSH Notes: tonsillectomy  appendectomy  basal cell on back removed  squamous cell on head removed   NON-GU PSH: None   GU PMH: Female genital prolapse, unspecified - 12/30/2018 Cystocele, midline - 10/28/2018 Urge incontinence - 10/28/2018 Urinary Frequency - 10/28/2018    NON-GU PMH: Anxiety Arthritis GERD Hypercholesterolemia Hypertension Hypothyroidism Stroke/TIA    FAMILY HISTORY: 1 Daughter - Daughter 1 son - Son Kidney Stones - Mother Patient's father is deceased - Father Patient's mother is deceased - Mother   SOCIAL HISTORY: Marital Status: Married Current Smoking Status: Patient does not smoke anymore. Has not smoked since 10/27/1968. Smoked for 10 years. Smoked 1 pack per day.   Tobacco Use Assessment Completed: Used Tobacco in last 30 days? Does not use smokeless tobacco. Drinks 3 drinks per week. Types of alcohol consumed: Wine.  Does not use drugs. Does not drink caffeine. Has not had a blood transfusion.     Notes: couple glasses of wine per week    REVIEW OF SYSTEMS:  GU Review Female:   Patient denies frequent urination, hard to postpone urination, burning /pain with urination, get up at night to urinate, leakage of urine, stream starts and stops, trouble starting your stream, have to strain to urinate, and being pregnant.  Gastrointestinal (Upper):   Patient denies nausea, vomiting, and indigestion/ heartburn.  Gastrointestinal (Lower):   Patient reports constipation. Patient denies diarrhea.   Constitutional:   Patient denies fever, night sweats, weight loss, and fatigue.  Skin:   Patient denies skin rash/ lesion and itching.  Eyes:   Patient denies blurred vision and double vision.  Ears/ Nose/ Throat:   Patient denies sore throat and sinus problems.  Hematologic/Lymphatic:   Patient denies swollen glands and easy bruising.  Cardiovascular:   Patient denies leg swelling and chest pains.  Respiratory:   Patient denies shortness of breath and cough.  Endocrine:   Patient denies excessive thirst.  Musculoskeletal:   Patient reports back pain. Patient denies joint pain.  Neurological:   Patient denies headaches and dizziness.  Psychologic:   Patient reports anxiety. Patient denies depression.   VITAL SIGNS:      02/09/2019 02:05 PM  BP 130/65 mmHg  Pulse 90 /min  Temperature 97.1 F / 36.1 C   MULTI-SYSTEM PHYSICAL EXAMINATION:    Constitutional: Well-nourished. No physical deformities. Normally developed. Good grooming.  Neck: Neck symmetrical, not swollen. Normal tracheal position.  Respiratory: Normal breath sounds. No labored breathing, no use of accessory muscles.   Cardiovascular: Regular rate and rhythm. No murmur, no gallop.   Lymphatic: No enlargement of neck, axillae, groin.  Skin: No paleness, no jaundice, no cyanosis. No lesion, no ulcer, no rash.  Neurologic / Psychiatric: Oriented to time, oriented to place, oriented to person. No depression, no anxiety, no agitation.  Gastrointestinal: No mass, no tenderness, no rigidity, non obese abdomen.  Eyes: Normal conjunctivae. Normal eyelids.  Ears, Nose, Mouth, and Throat: Left ear no scars, no lesions, no masses. Right ear no scars, no lesions, no masses. Nose no scars, no lesions, no masses. Normal hearing. Normal lips.  Musculoskeletal: Normal gait and station of head and neck.     PAST DATA REVIEWED:  Source Of History:  Patient  Records Review:   Previous Patient Records  Urine Test Review:   Urinalysis    02/09/19  Urinalysis  Urine Appearance Clear   Urine Color Yellow   Urine Glucose Neg mg/dL  Urine Bilirubin Neg mg/dL  Urine Ketones Neg mg/dL  Urine Specific Gravity 1.015   Urine Blood Neg ery/uL  Urine pH 7.0   Urine Protein Neg mg/dL  Urine Urobilinogen 0.2 mg/dL  Urine Nitrites Neg   Urine Leukocyte Esterase Neg leu/uL   PROCEDURES:          Urinalysis - 81003 Dipstick Dipstick Cont'd  Color: Yellow Bilirubin: Neg mg/dL  Appearance: Clear Ketones: Neg mg/dL  Specific Gravity: 1.015 Blood: Neg ery/uL  pH: 7.0 Protein: Neg mg/dL  Glucose: Neg mg/dL Urobilinogen: 0.2 mg/dL    Nitrites: Neg    Leukocyte Esterase: Neg leu/uL    ASSESSMENT:      ICD-10 Details  1 GU:   Female genital prolapse, unspecified - N81.9    PLAN:           Schedule Return Visit/Planned Activity: Keep Scheduled Appointment - Schedule Surgery          Document Letter(s):  Created for Patient: Clinical Summary         Notes:   There are no  changes in the patients history or physical exam since last evaluation by Dr. Louis Meckel. Pt is scheduled to undergo robotic sacrocolpopexy 02/25/19.

## 2019-02-25 NOTE — Anesthesia Postprocedure Evaluation (Signed)
Anesthesia Post Note  Patient: Ellisyn Icenhower  Procedure(s) Performed: XI ROBOTIC ASSISTED LAPAROSCOPIC SACROCOLPOPEXY (N/A )     Patient location during evaluation: PACU Anesthesia Type: General Level of consciousness: awake and alert Pain management: pain level controlled Vital Signs Assessment: post-procedure vital signs reviewed and stable Respiratory status: spontaneous breathing, nonlabored ventilation, respiratory function stable and patient connected to nasal cannula oxygen Cardiovascular status: blood pressure returned to baseline and stable Postop Assessment: no apparent nausea or vomiting Anesthetic complications: no    Last Vitals:  Vitals:   02/25/19 1415 02/25/19 1552  BP: (!) 128/51 133/61  Pulse:  63  Resp:  20  Temp:  36.6 C  SpO2:  100%    Last Pain:  Vitals:   02/25/19 1552  TempSrc: Oral  PainSc: 4                  Amely Voorheis

## 2019-02-25 NOTE — Interval H&P Note (Signed)
History and Physical Interval Note:  02/25/2019 6:48 AM  Sabrina Parks  has presented today for surgery, with the diagnosis of CYSTOCELE.  The various methods of treatment have been discussed with the patient and family. After consideration of risks, benefits and other options for treatment, the patient has consented to  Procedure(s): XI ROBOTIC ASSISTED LAPAROSCOPIC SACROCOLPOPEXY (N/A) as a surgical intervention.  The patient's history has been reviewed, patient examined, no change in status, stable for surgery.  I have reviewed the patient's chart and labs.  Questions were answered to the patient's satisfaction.     [name]

## 2019-02-26 ENCOUNTER — Encounter (HOSPITAL_COMMUNITY): Payer: Self-pay | Admitting: Urology

## 2019-02-26 DIAGNOSIS — F419 Anxiety disorder, unspecified: Secondary | ICD-10-CM | POA: Diagnosis not present

## 2019-02-26 DIAGNOSIS — K219 Gastro-esophageal reflux disease without esophagitis: Secondary | ICD-10-CM | POA: Diagnosis not present

## 2019-02-26 DIAGNOSIS — E7800 Pure hypercholesterolemia, unspecified: Secondary | ICD-10-CM | POA: Diagnosis not present

## 2019-02-26 DIAGNOSIS — I10 Essential (primary) hypertension: Secondary | ICD-10-CM | POA: Diagnosis not present

## 2019-02-26 DIAGNOSIS — N819 Female genital prolapse, unspecified: Secondary | ICD-10-CM | POA: Diagnosis not present

## 2019-02-26 DIAGNOSIS — N736 Female pelvic peritoneal adhesions (postinfective): Secondary | ICD-10-CM | POA: Diagnosis not present

## 2019-02-26 LAB — HEMOGLOBIN AND HEMATOCRIT, BLOOD
HCT: 31.9 % — ABNORMAL LOW (ref 36.0–46.0)
Hemoglobin: 10.4 g/dL — ABNORMAL LOW (ref 12.0–15.0)

## 2019-02-26 LAB — BASIC METABOLIC PANEL
Anion gap: 7 (ref 5–15)
BUN: 10 mg/dL (ref 8–23)
CO2: 23 mmol/L (ref 22–32)
Calcium: 8.9 mg/dL (ref 8.9–10.3)
Chloride: 101 mmol/L (ref 98–111)
Creatinine, Ser: 0.65 mg/dL (ref 0.44–1.00)
GFR calc Af Amer: >60 mL/min (ref >60)
GFR calc non Af Amer: >60 mL/min (ref >60)
Glucose, Bld: 108 mg/dL — ABNORMAL HIGH (ref 70–99)
Potassium: 3.7 mmol/L (ref 3.5–5.1)
Sodium: 131 mmol/L — ABNORMAL LOW (ref 135–145)

## 2019-02-26 MED ORDER — DOCUSATE SODIUM 100 MG PO CAPS: 100.0000 mg | ORAL_CAPSULE | Freq: Two times a day (BID) | ORAL | 2 refills | Status: AC

## 2019-02-26 NOTE — Discharge Instructions (Signed)
1.  Activity:  You are encouraged to ambulate frequently (about every hour during waking hours) to help prevent blood clots from forming in your legs or lungs.  However, you should not engage in any heavy lifting (> 10-15 lbs), strenuous activity, or straining. 2. Diet: You should advance your diet as instructed by your physician.  It will be normal to have some bloating, nausea, and abdominal discomfort intermittently. 3. Prescriptions:  You will be provided a prescription for pain medication to take as needed.  If your pain is not severe enough to require the prescription pain medication, you may take extra strength Tylenol instead which will have less side effects.  You should also take a prescribed stool softener to avoid straining with bowel movements as the prescription pain medication may constipate you. 4. Incisions: You may remove your dressing bandages 48 hours after surgery if not removed in the hospital.  You will either have some small staples or special tissue glue at each of the incision sites. Once the bandages are removed (if present), the incisions may stay open to air.  You may start showering (but not soaking or bathing in water) the 2nd day after surgery and the incisions simply need to be patted dry after the shower.  No additional care is needed. 5. What to call us about: You should call the office [phone]) if you develop fever > 101 or develop persistent vomiting.  6. Use estrogen cream (1 dollop) every other day until follow up to help with the healing process   You can resume aspirin, advil, aleve, vitamins, and supplements 7 days after surgery.

## 2019-02-26 NOTE — Discharge Summary (Signed)
[facility] Discharge Summary  Admit date: 02/25/2019  Discharge date and time: 02/26/19   Discharge to: Home  Discharge Service: Urology  Discharge Attending Physician:  Louis Meckel  Discharge  Diagnoses: Pelvic Prolapse  OR Procedures: Procedure(s): XI ROBOTIC ASSISTED LAPAROSCOPIC SACROCOLPOPEXY 02/25/2019   Ancillary Procedures: None   Discharge Day Services: The patient was seen and examined by the Urology team both in the morning and immediately prior to discharge.  Vital signs and laboratory values were stable and within normal limits.  The physical exam was benign and unchanged and all surgical wounds were examined.  Discharge instructions were explained and all questions answered.  Subjective  No acute events overnight. Pain Controlled. No fever or chills.  Objective Patient Vitals for the past 8 hrs:  BP Temp Temp src Pulse Resp SpO2  02/26/19 1430 (!) 153/71 97.6 F (36.4 C) Oral 77 16 97 %   Total I/O In: 60 [P.O.:60] Out: 950 [Urine:950]  General Appearance:        No acute distress Lungs:                       Normal work of breathing on room air Heart:                                Regular rate and rhythm Abdomen:                         Soft, non-tender, non-distended. Incisions c/d/i GU:         Vaginal packing removed. No bleeding Extremities:                      Warm and well perfused   Hospital Course:  The patient underwent  Robotic sacrocolpopexy on 02/25/2019.  The patient tolerated the procedure well, was extubated in the OR, and afterwards was taken to the PACU for routine post-surgical care. When stable the patient was transferred to the floor.     The patient did well postoperatively.  The patient's diet was slowly advanced and at the time of discharge was tolerating a regular diet.  Vaginal packing and foley were removed on POD 1.  The patient was discharged home 1 Day Post-Op, at which point was tolerating a regular solid diet, was able to  void spontaneously, have adequate pain control with P.O. pain medication, and could ambulate without difficulty. The patient will follow up with [place] for post op check. Will use estrogen cream QOD until follow up which has already been scheduled  Condition at Discharge: Improved  Discharge Medications:  Allergies as of 02/26/2019      Reactions   Codeine Nausea Only   Morphine And Related Hives   Sulfa Antibiotics Rash      Medication List    STOP taking these medications   Biotin 5000 MCG Tabs   cholecalciferol 25 MCG (1000 UT) tablet Commonly known as: VITAMIN D     TAKE these medications   acetaminophen 500 MG tablet Commonly known as: TYLENOL Take 500 mg by mouth every 6 (six) hours as needed for moderate pain or headache.   amitriptyline 25 MG tablet Commonly known as: ELAVIL Take 25 mg by mouth at bedtime.   aspirin EC 81 MG tablet Take 81 mg by mouth at bedtime.   clobetasol ointment 0.05 % Commonly known as: TEMOVATE Apply 1 application  topically 2 (two) times daily. Apply as directed twice daily.  Use for up to 7 days. What changed:   when to take this  reasons to take this  additional instructions   docusate sodium 100 MG capsule Commonly known as: Colace Take 1 capsule (100 mg total) by mouth 2 (two) times daily.   famotidine 10 MG tablet Commonly known as: PEPCID Take 10 mg by mouth daily.   hydrochlorothiazide 12.5 MG tablet Commonly known as: HYDRODIURIL Take 12.5 mg by mouth daily.   ibuprofen 200 MG tablet Commonly known as: ADVIL Take 200 mg by mouth every 8 (eight) hours as needed for headache or moderate pain.   levothyroxine 50 MCG tablet Commonly known as: SYNTHROID Take 50 mcg by mouth daily before breakfast.   LORazepam 0.5 MG tablet Commonly known as: ATIVAN Take 0.5 mg by mouth daily as needed for anxiety.   Nasacort Allergy 24HR 55 MCG/ACT Aero nasal inhaler Generic drug: triamcinolone Place 1 spray into the nose daily as  needed (for allergies).   nystatin cream Commonly known as: MYCOSTATIN Apply 1 application topically 2 (two) times daily. Apply to affected area BID for up to 7 days.   olmesartan 40 MG tablet Commonly known as: BENICAR Take 40 mg by mouth at bedtime.   ondansetron 4 MG tablet Commonly known as: ZOFRAN Take 4 mg by mouth every 6 (six) hours as needed for nausea.   ProAir HFA 108 (90 Base) MCG/ACT inhaler Generic drug: albuterol Inhale 2 puffs into the lungs every 6 (six) hours as needed for wheezing or shortness of breath.   rosuvastatin 10 MG tablet Commonly known as: CRESTOR Take 10 mg by mouth at bedtime.   traMADol 50 MG tablet Commonly known as: Ultram Take 1-2 tablets (50-100 mg total) by mouth every 6 (six) hours as needed for moderate pain or severe pain.

## 2019-02-26 NOTE — Progress Notes (Addendum)
Urology Progress Note   1 Day Post-Op days status post robotic sacral colpopexy  Subjective: Pain controled. Ambulated.  Packing removed this morning.  Urine clear.  Small drop in hemoglobin but likely just stabilization postop as there was minimal intraoperative bleeding.  plan to normalize this morning and likely discharge home  Objective: Vital signs in last 24 hours: Temp:  [97.8 F (36.6 C)-98.9 F (37.2 C)] 98.9 F (37.2 C) (07/31 0522) Pulse Rate:  [63-75] 68 (07/31 0522) Resp:  [11-20] 16 (07/31 0000) BP: (118-148)/(51-74) 133/57 (07/31 0522) SpO2:  [84 %-100 %] 100 % (07/31 0522) Weight:  [67.9 kg] 67.9 kg (07/30 1552)  Intake/Output from previous day: 07/30 0701 - 07/31 0700 In: 1963.1 [P.O.:60; I.V.:1553.1; IV Piggyback:350] Out: 900 [Urine:850; Blood:50] Intake/Output this shift: No intake/output data recorded.  Physical Exam:  General Appearance:  No acute distress. Alert and oriented x 3. Pulmonary: Normal respiratory effort on room air Cardiovascular: Regular rate Abdomen: Soft, non-tender, without masses. C/d/i Musculoskeletal: Extremities without edema. GU: No CVA or SP tenderness. Foley clear yellow Neurologic:  No motor abnormalities noted.    Lab Results: Recent Labs    02/24/19 1130 02/25/19 1143 02/26/19 0511  HGB 12.9 11.9* 10.4*  HCT 38.8 35.5* 31.9*   BMET Recent Labs    02/25/19 0625 02/26/19 0511  NA 131* 131*  K 3.9 3.7  CL 96* 101  CO2 25 23  GLUCOSE 111* 108*  BUN 11 10  CREATININE 0.62 0.65  CALCIUM 10.0 8.9     Studies/Results: No results found.  Assessment/Plan:  78 y.o. female s/p robotic sacral colpopexy.  Overall doing well post-op.   - d/c fluids - reg diet - d/c foley - home with estrogen, toradol stool softeners in PM   Dispo: Floor   LOS: 0 days   Tharon Aquas 02/26/2019, 7:38 AM

## 2019-03-11 DIAGNOSIS — N8110 Cystocele, unspecified: Secondary | ICD-10-CM | POA: Diagnosis not present

## 2019-03-14 LAB — CUP PACEART REMOTE DEVICE CHECK
Date Time Interrogation Session: 20200816023828
Implantable Pulse Generator Implant Date: 20181004

## 2019-03-15 ENCOUNTER — Ambulatory Visit (INDEPENDENT_AMBULATORY_CARE_PROVIDER_SITE_OTHER): Payer: Medicare Other | Admitting: *Deleted

## 2019-03-15 DIAGNOSIS — R55 Syncope and collapse: Secondary | ICD-10-CM | POA: Diagnosis not present

## 2019-03-23 NOTE — Progress Notes (Signed)
Carelink Summary Report / Loop Recorder 

## 2019-04-08 DIAGNOSIS — M5416 Radiculopathy, lumbar region: Secondary | ICD-10-CM | POA: Diagnosis not present

## 2019-04-08 DIAGNOSIS — M5116 Intervertebral disc disorders with radiculopathy, lumbar region: Secondary | ICD-10-CM | POA: Diagnosis not present

## 2019-04-10 DIAGNOSIS — Z23 Encounter for immunization: Secondary | ICD-10-CM | POA: Diagnosis not present

## 2019-04-12 ENCOUNTER — Telehealth: Payer: Self-pay | Admitting: Internal Medicine

## 2019-04-12 DIAGNOSIS — E039 Hypothyroidism, unspecified: Secondary | ICD-10-CM | POA: Diagnosis not present

## 2019-04-12 DIAGNOSIS — I10 Essential (primary) hypertension: Secondary | ICD-10-CM | POA: Diagnosis not present

## 2019-04-12 NOTE — Telephone Encounter (Signed)
New message   Pt c/o BP issue: STAT if pt c/o blurred vision, one-sided weakness or slurred speech  1. What are your last 5 BP readings? 158/74  153/82 149/76 147/76 160/76 179/72  2. Are you having any other symptoms (ex. Dizziness, headache, blurred vision, passed out)? Dizzy   3. What is your BP issue? Patient states that her b/p is elevated for about a week.

## 2019-04-12 NOTE — Telephone Encounter (Signed)
Called pt   She reached Dr Manya Silvas office earlier   Had some dizziness that made her nervous  PA at that office recomm that she take meds mid day  Higher readings have come at night   Pt will follow BP   I told the pt to continue to follow   If remains high then will add anotehr med  She says she may have been on amldoipine in the past    Tried t oreassure her that she was not in any danger

## 2019-04-15 ENCOUNTER — Ambulatory Visit (INDEPENDENT_AMBULATORY_CARE_PROVIDER_SITE_OTHER): Payer: Medicare Other | Admitting: *Deleted

## 2019-04-15 DIAGNOSIS — R55 Syncope and collapse: Secondary | ICD-10-CM

## 2019-04-16 LAB — CUP PACEART REMOTE DEVICE CHECK
Date Time Interrogation Session: 20200918024144
Implantable Pulse Generator Implant Date: 20181004

## 2019-04-19 NOTE — Progress Notes (Signed)
Carelink Summary Report / Loop Recorder 

## 2019-04-20 DIAGNOSIS — C4442 Squamous cell carcinoma of skin of scalp and neck: Secondary | ICD-10-CM | POA: Diagnosis not present

## 2019-04-20 DIAGNOSIS — Z85828 Personal history of other malignant neoplasm of skin: Secondary | ICD-10-CM | POA: Diagnosis not present

## 2019-04-20 DIAGNOSIS — L570 Actinic keratosis: Secondary | ICD-10-CM | POA: Diagnosis not present

## 2019-04-20 DIAGNOSIS — D485 Neoplasm of uncertain behavior of skin: Secondary | ICD-10-CM | POA: Diagnosis not present

## 2019-05-05 DIAGNOSIS — H04123 Dry eye syndrome of bilateral lacrimal glands: Secondary | ICD-10-CM | POA: Diagnosis not present

## 2019-05-05 DIAGNOSIS — H35051 Retinal neovascularization, unspecified, right eye: Secondary | ICD-10-CM | POA: Diagnosis not present

## 2019-05-05 DIAGNOSIS — H52201 Unspecified astigmatism, right eye: Secondary | ICD-10-CM | POA: Diagnosis not present

## 2019-05-05 DIAGNOSIS — H26493 Other secondary cataract, bilateral: Secondary | ICD-10-CM | POA: Diagnosis not present

## 2019-05-17 DIAGNOSIS — Z85828 Personal history of other malignant neoplasm of skin: Secondary | ICD-10-CM | POA: Diagnosis not present

## 2019-05-17 DIAGNOSIS — C4442 Squamous cell carcinoma of skin of scalp and neck: Secondary | ICD-10-CM | POA: Diagnosis not present

## 2019-05-18 ENCOUNTER — Ambulatory Visit (INDEPENDENT_AMBULATORY_CARE_PROVIDER_SITE_OTHER): Payer: Medicare Other | Admitting: *Deleted

## 2019-05-18 DIAGNOSIS — R55 Syncope and collapse: Secondary | ICD-10-CM | POA: Diagnosis not present

## 2019-05-18 DIAGNOSIS — I447 Left bundle-branch block, unspecified: Secondary | ICD-10-CM

## 2019-05-19 LAB — CUP PACEART REMOTE DEVICE CHECK
Date Time Interrogation Session: 20201021024130
Implantable Pulse Generator Implant Date: 20181004

## 2019-05-27 DIAGNOSIS — H26491 Other secondary cataract, right eye: Secondary | ICD-10-CM | POA: Diagnosis not present

## 2019-06-01 ENCOUNTER — Encounter: Payer: Self-pay | Admitting: Internal Medicine

## 2019-06-01 ENCOUNTER — Ambulatory Visit (INDEPENDENT_AMBULATORY_CARE_PROVIDER_SITE_OTHER): Payer: Medicare Other | Admitting: Internal Medicine

## 2019-06-01 ENCOUNTER — Other Ambulatory Visit: Payer: Self-pay

## 2019-06-01 VITALS — BP 132/68 | HR 88 | Ht 64.0 in | Wt 145.0 lb

## 2019-06-01 DIAGNOSIS — R55 Syncope and collapse: Secondary | ICD-10-CM | POA: Diagnosis not present

## 2019-06-01 DIAGNOSIS — I447 Left bundle-branch block, unspecified: Secondary | ICD-10-CM

## 2019-06-01 NOTE — Progress Notes (Signed)
HPI [name] returns today for followup of unexplained syncope. She is a pleasant 78 yo woman who underwent ILR insertion about 2 years ago. She has activated her device which demonstrates ST and PVC's. She has had a couple of episodes of NS SVT for which she was not symptomatic. She denies chest pain or sob. No syncope. Allergies  Allergen Reactions  . Codeine Nausea Only  . Morphine And Related Hives  . Sulfa Antibiotics Rash     Current Outpatient Medications  Medication Sig Dispense Refill  . acetaminophen (TYLENOL) 500 MG tablet Take 500 mg by mouth every 6 (six) hours as needed for moderate pain or headache.    Marland Kitchen amitriptyline (ELAVIL) 25 MG tablet Take 25 mg by mouth at bedtime.     Marland Kitchen aspirin EC 81 MG tablet Take 81 mg by mouth at bedtime.    . clobetasol ointment (TEMOVATE) [id number] % Apply 1 application topically 2 (two) times daily. Apply as directed twice daily.  Use for up to 7 days. (Patient taking differently: Apply 1 application topically 2 (two) times daily as needed (skin irritation.). ) 60 g 0  . docusate sodium (COLACE) 100 MG capsule Take 1 capsule (100 mg total) by mouth 2 (two) times daily. 60 capsule 2  . famotidine (PEPCID) 10 MG tablet Take 10 mg by mouth daily.    . hydrochlorothiazide (HYDRODIURIL) 12.5 MG tablet Take 12.5 mg by mouth daily.    Marland Kitchen ibuprofen (ADVIL,MOTRIN) 200 MG tablet Take 200 mg by mouth every 8 (eight) hours as needed for headache or moderate pain.     Marland Kitchen levothyroxine (SYNTHROID, LEVOTHROID) 50 MCG tablet Take 50 mcg by mouth daily before breakfast.    . LORazepam (ATIVAN) 0.5 MG tablet Take 0.5 mg by mouth daily as needed for anxiety.     Marland Kitchen nystatin cream (MYCOSTATIN) Apply 1 application topically 2 (two) times daily. Apply to affected area BID for up to 7 days. 30 g 0  . olmesartan (BENICAR) 40 MG tablet Take 40 mg by mouth at bedtime.     . ondansetron (ZOFRAN) 4 MG tablet Take 4 mg by mouth every 6 (six) hours as needed for nausea.    1  . PROAIR HFA 108 (90 BASE) MCG/ACT inhaler Inhale 2 puffs into the lungs every 6 (six) hours as needed for wheezing or shortness of breath.     . rosuvastatin (CRESTOR) 10 MG tablet Take 10 mg by mouth at bedtime.     . traMADol (ULTRAM) 50 MG tablet Take 1-2 tablets (50-100 mg total) by mouth every 6 (six) hours as needed for moderate pain or severe pain. 30 tablet 0  . triamcinolone (NASACORT ALLERGY 24HR) 55 MCG/ACT AERO nasal inhaler Place 1 spray into the nose daily as needed (for allergies).     No current facility-administered medications for this visit.      Past Medical History:  Diagnosis Date  . Alopecia   . Arthritis   . Complication of anesthesia   . DJD (degenerative joint disease), cervical 5/02   [facility]  . HSV-1 infection   . Hyperlipidemia   . Hypertension   . Lichen sclerosus 0000000   biopsy proven  . Liver hemangioma 2011   Dr. Earlean Shawl  . PONV (postoperative nausea and vomiting)   . Thyroiditis   . Transient global amnesia    not fully diag    ROS:   All systems reviewed and negative except as noted in the HPI.  Past Surgical History:  Procedure Laterality Date  . APPENDECTOMY  1956  . CARDIOVASCULAR STRESS TEST  10/29/2006   EF 78%.   [place] REPAIR N/A 12/29/2018   Procedure: exam under anesthesia;  Surgeon: Bjorn Loser, MD;  Location: [facility];  Service: Urology;  Laterality: N/A;  . LOOP RECORDER INSERTION N/A 05/01/2017   Procedure: LOOP RECORDER INSERTION;  Surgeon: Evans Lance, MD;  Location: [facility];  Service: Cardiovascular;  Laterality: N/A;  . ROBOTIC ASSISTED LAPAROSCOPIC SACROCOLPOPEXY N/A 02/25/2019   Procedure: XI ROBOTIC ASSISTED LAPAROSCOPIC SACROCOLPOPEXY;  Surgeon: [name], MD;  Location: [facility];  Service: Urology;  Laterality: N/A;  . ROTATOR CUFF REPAIR Bilateral   . SHOULDER SURGERY    . TONSILLECTOMY AND ADENOIDECTOMY  1957  . TOTAL ABDOMINAL HYSTERECTOMY W/ BILATERAL SALPINGOOPHORECTOMY   7/90  . US ECHOCARDIOGRAPHY  10/11/2009   EF 55-60%     Family History  Problem Relation Age of Onset  . Stroke Mother   . Cancer Father   . Colon cancer Father        49's     Social History   Socioeconomic History  . Marital status: Married    Spouse name: Not on file  . Number of children: Not on file  . Years of education: Not on file  . Highest education level: Not on file  Occupational History  . Not on file  Social Needs  . Financial resource strain: Not on file  . Food insecurity    Worry: Not on file    Inability: Not on file  . Transportation needs    Medical: Not on file    Non-medical: Not on file  Tobacco Use  . Smoking status: Former Smoker    Years: 10.00    Types: Cigarettes    Quit date: 01/01/1970    Years since quitting: 49.4  . Smokeless tobacco: Never Used  Substance and Sexual Activity  . Alcohol use: Yes    Comment: 1-2 glasses of wine   . Drug use: No  . Sexual activity: Yes    Partners: Male    Comment: hysterectomy  Lifestyle  . Physical activity    Days per week: Not on file    Minutes per session: Not on file  . Stress: Not on file  Relationships  . Social Herbalist on phone: Not on file    Gets together: Not on file    Attends religious service: Not on file    Active member of club or organization: Not on file    Attends meetings of clubs or organizations: Not on file    Relationship status: Not on file  . Intimate partner violence    Fear of current or ex partner: Not on file    Emotionally abused: Not on file    Physically abused: Not on file    Forced sexual activity: Not on file  Other Topics Concern  . Not on file  Social History Narrative  . Not on file     BP 132/68   Pulse 88   Ht 5\' 4"  (1.626 m)   Wt 145 lb (65.8 kg)   LMP 07/29/1990   BMI 24.89 kg/m   Physical Exam:  Well appearing NAD HEENT: Unremarkable Neck:  No JVD, no thyromegally Lymphatics:  No adenopathy Back:  No CVA  tenderness Lungs:  Clear with no wheezes HEART:  Regular rate rhythm, no murmurs, no rubs, no clicks Abd:  soft, positive bowel sounds, no organomegally, no rebound, no guarding Ext:  2 plus pulses, no edema, no cyanosis, no clubbing Skin:  No rashes no nodules Neuro:  CN II through XII intact, motor grossly intact  EKG - NSR  DEVICE  Normal device function.  See PaceArt for details.   Assess/Plan: 1. Syncope - she has not had any recurrent episodes. She will undergo watchful waiting. 2. HTN - her SBP is well controlled. She will continue her current meds.   Mikle Bosworth.D.

## 2019-06-01 NOTE — Patient Instructions (Signed)
Medication Instructions:  Your physician recommends that you continue on your current medications as directed. Please refer to the Current Medication list given to you today.  Labwork: None ordered.  Testing/Procedures: None ordered.  Follow-Up: Your physician wants you to follow-up in: one year with Dr. Lovena Le.   You will receive a reminder letter in the mail two months in advance. If you don't receive a letter, please call our office to schedule the follow-up appointment.  Monthly remote monitoring  Any Other Special Instructions Will Be Listed Below (If Applicable).  If you need a refill on your cardiac medications before your next appointment, please call your pharmacy.

## 2019-06-02 ENCOUNTER — Telehealth: Payer: Self-pay

## 2019-06-02 NOTE — Telephone Encounter (Signed)
I told the pt I tried ordering her a new monitor but [employer] states they got a successful transmission from the pt 05/06/2019. I do see the pt has a successful transmission on 06-01-2019. The pt states she do not have the monitor and she was told to leave the monitor at the office. I told her I will make sure she receives a new monitor.

## 2019-06-03 NOTE — Progress Notes (Signed)
Carelink Summary Report / Loop Recorder 

## 2019-06-07 DIAGNOSIS — N8110 Cystocele, unspecified: Secondary | ICD-10-CM | POA: Diagnosis not present

## 2019-06-17 NOTE — Progress Notes (Signed)
Cardiology Office Note   Date:  06/18/2019   ID:  Sabrina Parks, DOB 07-02-1941, MRN [id number]  PCP:  Marton Redwood, MD  Cardiologist:   Dorris Carnes, MD   F/U of HTN     History of Present Illness: Sabrina Parks is a 78 y.o. female with a history of HTN, HL and palpitations and one episode of global amnesia in 2013.  Stress test 2011 negaitve for ischemia.  Echo Nov 2013 mild LVH.  LVEF 65 to [id number] with Gr I diastlic dysfunc.   MRA neg  Carotid USN neg  I saw her in March 2018  LBBB block on EKG new  Myovue normal  Also had one episode of syncope   Now has ILR and is followed by Beckie Salts    I last saw the patient back in May.  A few weeks ago she was seen by Dr. Lovena Le.  No events noted on ILR  Continues to have occasional spells of her heart racing.  She also notes occasional spells of dizziness with quick standing.  Otherwise she remains active.  She is walking and she is going to the [facility] for  water aerobics. Current Meds  Medication Sig  . acetaminophen (TYLENOL) 500 MG tablet Take 500 mg by mouth every 6 (six) hours as needed for moderate pain or headache.  Marland Kitchen amitriptyline (ELAVIL) 25 MG tablet Take 25 mg by mouth at bedtime.   Marland Kitchen aspirin EC 81 MG tablet Take 81 mg by mouth at bedtime.  . clobetasol ointment (TEMOVATE) [id number] % Apply 1 application topically 2 (two) times daily. Apply as directed twice daily.  Use for up to 7 days. (Patient taking differently: Apply 1 application topically 2 (two) times daily as needed (skin irritation.). )  . docusate sodium (COLACE) 100 MG capsule Take 1 capsule (100 mg total) by mouth 2 (two) times daily.  . famotidine (PEPCID) 10 MG tablet Take 10 mg by mouth daily.  . hydrochlorothiazide (HYDRODIURIL) 12.5 MG tablet Take 12.5 mg by mouth daily.  Marland Kitchen ibuprofen (ADVIL,MOTRIN) 200 MG tablet Take 200 mg by mouth every 8 (eight) hours as needed for headache or moderate pain.   Marland Kitchen levothyroxine (SYNTHROID, LEVOTHROID) 50 MCG tablet Take 50 mcg by mouth  daily before breakfast.  . LORazepam (ATIVAN) 0.5 MG tablet Take 0.5 mg by mouth daily as needed for anxiety.   Marland Kitchen nystatin cream (MYCOSTATIN) Apply 1 application topically 2 (two) times daily. Apply to affected area BID for up to 7 days.  Marland Kitchen olmesartan (BENICAR) 40 MG tablet Take 40 mg by mouth at bedtime.   . ondansetron (ZOFRAN) 4 MG tablet Take 4 mg by mouth every 6 (six) hours as needed for nausea.   Marland Kitchen PROAIR HFA 108 (90 BASE) MCG/ACT inhaler Inhale 2 puffs into the lungs every 6 (six) hours as needed for wheezing or shortness of breath.   . rosuvastatin (CRESTOR) 10 MG tablet Take 10 mg by mouth at bedtime.   . triamcinolone (NASACORT ALLERGY 24HR) 55 MCG/ACT AERO nasal inhaler Place 1 spray into the nose daily as needed (for allergies).     Allergies:   Codeine, Morphine and related, and Sulfa antibiotics   Past Medical History:  Diagnosis Date  . Alopecia   . Arthritis   . Complication of anesthesia   . DJD (degenerative joint disease), cervical 5/02   Elsner  . HSV-1 infection   . Hyperlipidemia   . Hypertension   . Lichen sclerosus 0000000   biopsy proven  .  Liver hemangioma 2011   Dr. Earlean Shawl  . PONV (postoperative nausea and vomiting)   . Thyroiditis   . Transient global amnesia    not fully diag    Past Surgical History:  Procedure Laterality Date  . APPENDECTOMY  1956  . CARDIOVASCULAR STRESS TEST  10/29/2006   EF 78%.   Lester Foreman REPAIR N/A 12/29/2018   Procedure: exam under anesthesia;  Surgeon: Bjorn Loser, MD;  Location: [facility];  Service: Urology;  Laterality: N/A;  . LOOP RECORDER INSERTION N/A 05/01/2017   Procedure: LOOP RECORDER INSERTION;  Surgeon: Evans Lance, MD;  Location: [facility];  Service: Cardiovascular;  Laterality: N/A;  . ROBOTIC ASSISTED LAPAROSCOPIC SACROCOLPOPEXY N/A 02/25/2019   Procedure: XI ROBOTIC ASSISTED LAPAROSCOPIC SACROCOLPOPEXY;  Surgeon: [name], MD;  Location: [facility];  Service: Urology;  Laterality:  N/A;  . ROTATOR CUFF REPAIR Bilateral   . SHOULDER SURGERY    . TONSILLECTOMY AND ADENOIDECTOMY  1957  . TOTAL ABDOMINAL HYSTERECTOMY W/ BILATERAL SALPINGOOPHORECTOMY  7/90  . US ECHOCARDIOGRAPHY  10/11/2009   EF 55-60%     Social History:  The patient  reports that she quit smoking about 49 years ago. Her smoking use included cigarettes. She quit after 10.00 years of use. She has never used smokeless tobacco. She reports current alcohol use. She reports that she does not use drugs.   Family History:  The patient's family history includes Cancer in her father; Colon cancer in her father; Stroke in her mother.    ROS:  Please see the history of present illness. All other systems are reviewed and  Negative to the above problem except as noted.    PHYSICAL EXAM: VS:  BP (!) 124/58   Pulse 82   Ht 5\' 4"  (1.626 m)   Wt 148 lb 3.2 oz (67.2 kg)   LMP 07/29/1990   BMI 25.44 kg/m     GEN: Well nourished, well developed, in no acute distress  HEENT: normal  Neck: JVP normal  No carotid bruits, or masses Cardiac: RRR; no murmurs, rubs, or gallops,no edema  Respiratory:  clear to auscultation bilaterally, normal work of breathing GI: soft, nontender, nondistended, + BS  No hepatomegaly  MS: no deformity Moving all extremities   Skin: warm and dry, no rash Neuro:  Strength and sensation are intact Psych: euthymic mood, full affect   EKG:  EKG is not done   Lipid Panel    Component Value Date/Time   CHOL 199 12/18/2018 1356   TRIG 166 (H) 12/18/2018 1356   HDL 74 12/18/2018 1356   CHOLHDL 2.7 12/18/2018 1356   LDLCALC 92 12/18/2018 1356      Wt Readings from Last 3 Encounters:  06/18/19 148 lb 3.2 oz (67.2 kg)  06/01/19 145 lb (65.8 kg)  02/25/19 149 lb 9.6 oz (67.9 kg)      ASSESSMENT AND PLAN:  1  Dizziness.  Pt has with quick standing  Occasional  No syncope  2  Palpitations  / heart racing     Continues to have spells occsaionally   Has LINQ in place   3  LBBB      4  HL  Lipids are good  LDL 92  HDL 75    5  HTN   BP is up a little at home  140s   Will swithc HCTZ to Maxzide 37.5/25   Start with 1/2  Check BMET in 3 wks    F/U next  summer   Current medicines are reviewed at length with the patient today.  The patient does not have concerns regarding medicines.  Signed, Dorris Carnes, MD  06/18/2019 1:47 PM    [facility] [address], [place], [place]  [address] Phone: [phone]; Fax: [phone]

## 2019-06-18 ENCOUNTER — Ambulatory Visit (INDEPENDENT_AMBULATORY_CARE_PROVIDER_SITE_OTHER): Payer: Medicare Other | Admitting: Internal Medicine

## 2019-06-18 ENCOUNTER — Encounter: Payer: Self-pay | Admitting: Internal Medicine

## 2019-06-18 ENCOUNTER — Other Ambulatory Visit: Payer: Self-pay

## 2019-06-18 VITALS — BP 124/58 | HR 82 | Ht 64.0 in | Wt 148.2 lb

## 2019-06-18 DIAGNOSIS — I10 Essential (primary) hypertension: Secondary | ICD-10-CM

## 2019-06-18 MED ORDER — TRIAMTERENE-HCTZ 37.5-25 MG PO TABS: 0.5000 | ORAL_TABLET | Freq: Every day | ORAL | 3 refills | Status: DC

## 2019-06-18 NOTE — Patient Instructions (Signed)
Medication Instructions:  Your physician has recommended you make the following change in your medication:  1.) stop hydrochlorothiazide 2.) start triamterene/hctz 37.5/25 mg --take 1/2 tablet daily  *If you need a refill on your cardiac medications before your next appointment, please call your pharmacy*  Lab Work: In 3 weeks:  BMET If you have labs (blood work) drawn today and your tests are completely normal, you will receive your results only by: Marland Kitchen MyChart Message (if you have MyChart) OR . A paper copy in the mail If you have any lab test that is abnormal or we need to change your treatment, we will call you to review the results.  Testing/Procedures: none  Follow-Up: At [facility], you and your health needs are our priority.  As part of our continuing mission to provide you with exceptional heart care, we have created designated Provider Care Teams.  These Care Teams include your primary Cardiologist (physician) and Advanced Practice Providers (APPs -  Physician Assistants and Nurse Practitioners) who all work together to provide you with the care you need, when you need it.  Your next appointment:   8 months (July 2021)  The format for your next appointment:   In Person  Provider:   Dorris Carnes, MD  Other Instructions

## 2019-06-21 ENCOUNTER — Ambulatory Visit (INDEPENDENT_AMBULATORY_CARE_PROVIDER_SITE_OTHER): Payer: Medicare Other | Admitting: *Deleted

## 2019-06-21 DIAGNOSIS — R55 Syncope and collapse: Secondary | ICD-10-CM

## 2019-06-21 LAB — CUP PACEART REMOTE DEVICE CHECK
Date Time Interrogation Session: 20201122214318
Implantable Pulse Generator Implant Date: 20181004

## 2019-06-29 DIAGNOSIS — H903 Sensorineural hearing loss, bilateral: Secondary | ICD-10-CM | POA: Diagnosis not present

## 2019-07-07 ENCOUNTER — Encounter: Payer: Self-pay | Admitting: Podiatry

## 2019-07-07 ENCOUNTER — Ambulatory Visit (INDEPENDENT_AMBULATORY_CARE_PROVIDER_SITE_OTHER): Payer: Medicare Other | Admitting: Podiatry

## 2019-07-07 ENCOUNTER — Other Ambulatory Visit: Payer: Self-pay

## 2019-07-07 DIAGNOSIS — B351 Tinea unguium: Secondary | ICD-10-CM

## 2019-07-07 DIAGNOSIS — M79675 Pain in left toe(s): Secondary | ICD-10-CM | POA: Diagnosis not present

## 2019-07-07 DIAGNOSIS — M79674 Pain in right toe(s): Secondary | ICD-10-CM | POA: Diagnosis not present

## 2019-07-07 NOTE — Progress Notes (Signed)
Complaint:  Visit Type: Patient presents  to my office for  preventative foot care services. Complaint: Patient states" my nails have grown long and thick and become painful to walk and wear shoes" . The patient presents for preventative foot care services.  Podiatric Exam: Vascular: dorsalis pedis and posterior tibial pulses are palpable bilateral. Capillary return is immediate. Temperature gradient is WNL. Skin turgor WNL  Sensorium: Normal Semmes Weinstein monofilament test. Normal tactile sensation bilaterally. Nail Exam: Pt has thick disfigured discolored nails with subungual debris noted hallux nails  B/L. Ulcer Exam: There is no evidence of ulcer or pre-ulcerative changes or infection. Orthopedic Exam: Muscle tone and strength are WNL. No limitations in general ROM. No crepitus or effusions noted. Midfoot arthritis  B/L.  HAV with hammer toes  B/L.  Tailor bunion 5th MPJ  B/L. Skin: No Porokeratosis. No infection or ulcers  Diagnosis:  Onychomycosis, , Pain in right toe, pain in left toes  Treatment & Plan Procedures and Treatment: Consent by patient was obtained for treatment procedures.   Debridement of onychomycosis  X 2.   No ulceration, no infection noted.  Return Visit-Office Procedure: Patient instructed to return to the office for a follow up visit 4 months for continued evaluation and treatment.    Gardiner Barefoot DPM

## 2019-07-09 ENCOUNTER — Other Ambulatory Visit: Payer: Self-pay

## 2019-07-09 ENCOUNTER — Other Ambulatory Visit: Payer: Medicare Other | Admitting: *Deleted

## 2019-07-09 DIAGNOSIS — I10 Essential (primary) hypertension: Secondary | ICD-10-CM | POA: Diagnosis not present

## 2019-07-09 LAB — BASIC METABOLIC PANEL
BUN/Creatinine Ratio: 23 (ref 12–28)
BUN: 18 mg/dL (ref 8–27)
CO2: 24 mmol/L (ref 20–29)
Calcium: 10.4 mg/dL — ABNORMAL HIGH (ref 8.7–10.3)
Chloride: 101 mmol/L (ref 96–106)
Creatinine, Ser: 0.79 mg/dL (ref 0.57–1.00)
GFR calc Af Amer: 84 mL/min/{1.73_m2} (ref >59)
GFR calc non Af Amer: 72 mL/min/{1.73_m2} (ref >59)
Glucose: 86 mg/dL (ref 65–99)
Potassium: 4.3 mmol/L (ref 3.5–5.2)
Sodium: 140 mmol/L (ref 134–144)

## 2019-07-12 ENCOUNTER — Telehealth: Payer: Self-pay | Admitting: Internal Medicine

## 2019-07-12 NOTE — Telephone Encounter (Signed)
Follow Up:    Pt is calling back , concerning her lab results from 07-09-19.

## 2019-07-13 NOTE — Telephone Encounter (Signed)
Patient has been informed of results.  BP readings have been much better.  Much closer to normal all the time for her now since starting 1/2 Maxzide 37.5/25 daily.  She is feeling good.

## 2019-07-22 ENCOUNTER — Ambulatory Visit (INDEPENDENT_AMBULATORY_CARE_PROVIDER_SITE_OTHER): Payer: Medicare Other | Admitting: *Deleted

## 2019-07-22 DIAGNOSIS — G459 Transient cerebral ischemic attack, unspecified: Secondary | ICD-10-CM | POA: Diagnosis not present

## 2019-07-22 LAB — CUP PACEART REMOTE DEVICE CHECK
Date Time Interrogation Session: 20201223230832
Implantable Pulse Generator Implant Date: 20181004

## 2019-07-25 ENCOUNTER — Emergency Department (HOSPITAL_BASED_OUTPATIENT_CLINIC_OR_DEPARTMENT_OTHER)
Admission: EM | Admit: 2019-07-25 | Discharge: 2019-07-25 | Disposition: A | Discharge status: Home / Self Care | Payer: Medicare Other | Attending: Emergency Medicine | Admitting: Emergency Medicine

## 2019-07-25 ENCOUNTER — Other Ambulatory Visit: Payer: Self-pay

## 2019-07-25 ENCOUNTER — Encounter (HOSPITAL_BASED_OUTPATIENT_CLINIC_OR_DEPARTMENT_OTHER): Payer: Self-pay | Admitting: *Deleted

## 2019-07-25 DIAGNOSIS — R3911 Hesitancy of micturition: Secondary | ICD-10-CM | POA: Diagnosis present

## 2019-07-25 DIAGNOSIS — R339 Retention of urine, unspecified: Secondary | ICD-10-CM | POA: Diagnosis not present

## 2019-07-25 DIAGNOSIS — E039 Hypothyroidism, unspecified: Secondary | ICD-10-CM | POA: Insufficient documentation

## 2019-07-25 DIAGNOSIS — Z7982 Long term (current) use of aspirin: Secondary | ICD-10-CM | POA: Diagnosis not present

## 2019-07-25 DIAGNOSIS — Z79899 Other long term (current) drug therapy: Secondary | ICD-10-CM | POA: Insufficient documentation

## 2019-07-25 DIAGNOSIS — I10 Essential (primary) hypertension: Secondary | ICD-10-CM | POA: Diagnosis not present

## 2019-07-25 DIAGNOSIS — Z87891 Personal history of nicotine dependence: Secondary | ICD-10-CM | POA: Insufficient documentation

## 2019-07-25 DIAGNOSIS — K5900 Constipation, unspecified: Secondary | ICD-10-CM | POA: Diagnosis not present

## 2019-07-25 LAB — URINALYSIS, ROUTINE W REFLEX MICROSCOPIC
Bilirubin Urine: NEGATIVE
Glucose, UA: NEGATIVE mg/dL
Hgb urine dipstick: NEGATIVE
Ketones, ur: NEGATIVE mg/dL
Leukocytes,Ua: NEGATIVE
Nitrite: NEGATIVE
Protein, ur: NEGATIVE mg/dL
Specific Gravity, Urine: 1.02 (ref 1.005–1.030)
pH: 7 (ref 5.0–8.0)

## 2019-07-25 NOTE — ED Triage Notes (Addendum)
C/o urinary difficulty that started yesterday. States she is urinating small amounts. States lower abd pain that started yesterday evening. Denies any fevers. Had a bladder surgery this past summer. Also states she had been feeling constipated yesterday and took a laxative and is now having loose stools.

## 2019-07-25 NOTE — Discharge Instructions (Addendum)
I recommend that you increase your water and fiber intake. If you are not able to eat foods high in fiber, you may use Benefiber or Metamucil over-the-counter. I also recommend you use MiraLAX 1-2 times a day and Colace 100 mg twice a day to help with bowel movements. These medications are over the counter.  You may use other over-the-counter medications such as Dulcolax, Fleet enemas, magnesium citrate as needed for constipation. Please note that some of these medications may cause you to have abdominal cramping which is normal. If you develop severe abdominal pain, fever (temperature of 100.4 or higher), persistent vomiting, distention of your abdomen, unable to have a bowel movement for 5 days or are not passing gas, please return to the hospital.  If you develop return of your urinary retention where you are not able to fully empty your bladder or begin having abdominal distention or discomfort again, please return to the emergency department.  If you develop severe abdominal pain, fever of 100.4 or higher, vomiting, blood in your stool or black and tarry stools, please return to the emergency department as well.

## 2019-07-25 NOTE — Progress Notes (Signed)
ILR remote 

## 2019-07-25 NOTE — ED Provider Notes (Addendum)
TIME SEEN: 4:23 AM  CHIEF COMPLAINT: Difficulty urinating, lower abdominal discomfort  HPI: Patient is a 78 year old female who presents to the emergency department with lower abdominal discomfort, difficulty urinating that started at 4:30 PM.  States initially thought that it was from constipation and drank magnesium citrate.  Now starting to have loose bowel movements but still unable to urinate normally.  States she is only urinating small amounts at a time.  She denies any fevers, vomiting, back pain, numbness, tingling, focal weakness, bowel or bladder incontinence.  She has never had a history of urinary retention.  Has history of hysterectomy.  Had Sarcocolpopexy by urology in July 2020.  Has not noticed any prolapse.  ROS: See HPI Constitutional: no fever  Eyes: no drainage  ENT: no runny nose   Cardiovascular:  no chest pain  Resp: no SOB  GI: no vomiting GU: no dysuria Integumentary: no rash  Allergy: no hives  Musculoskeletal: no leg swelling  Neurological: no slurred speech ROS otherwise negative  PAST MEDICAL HISTORY/PAST SURGICAL HISTORY:  Past Medical History:  Diagnosis Date  . Alopecia   . Arthritis   . Complication of anesthesia   . DJD (degenerative joint disease), cervical 5/02   Elsner  . HSV-1 infection   . Hyperlipidemia   . Hypertension   . Lichen sclerosus 0000000   biopsy proven  . Liver hemangioma 2011   Dr. Earlean Shawl  . PONV (postoperative nausea and vomiting)   . Thyroiditis   . Transient global amnesia    not fully diag    MEDICATIONS:  Prior to Admission medications   Medication Sig Start Date End Date Taking? Authorizing Provider  acetaminophen (TYLENOL) 500 MG tablet Take 500 mg by mouth every 6 (six) hours as needed for moderate pain or headache.    [provider]  amitriptyline (ELAVIL) 25 MG tablet Take 25 mg by mouth at bedtime.  07/08/13   [provider]  aspirin EC 81 MG tablet Take 81 mg by mouth at bedtime.     [provider]  clobetasol ointment (TEMOVATE) AB-123456789 % Apply 1 application topically 2 (two) times daily. Apply as directed twice daily.  Use for up to 7 days. Patient taking differently: Apply 1 application topically 2 (two) times daily as needed (skin irritation.).  11/12/17   Megan Salon, MD  docusate sodium (COLACE) 100 MG capsule Take 1 capsule (100 mg total) by mouth 2 (two) times daily. 02/26/19 02/26/20  [name], MD  famotidine (PEPCID) 10 MG tablet Take 10 mg by mouth daily.    [provider]  ibuprofen (ADVIL,MOTRIN) 200 MG tablet Take 200 mg by mouth every 8 (eight) hours as needed for headache or moderate pain.     [provider]  levothyroxine (SYNTHROID, LEVOTHROID) 50 MCG tablet Take 50 mcg by mouth daily before breakfast.    [provider]  LORazepam (ATIVAN) 0.5 MG tablet Take 0.5 mg by mouth daily as needed for anxiety.  07/29/13   [provider]  nystatin cream (MYCOSTATIN) Apply 1 application topically 2 (two) times daily. Apply to affected area BID for up to 7 days. 09/24/18   Megan Salon, MD  olmesartan (BENICAR) 40 MG tablet Take 40 mg by mouth at bedtime.  09/02/17   [provider]  ondansetron (ZOFRAN) 4 MG tablet Take 4 mg by mouth every 6 (six) hours as needed for nausea.  06/08/18   [provider]  [place] 108 (90 BASE)  MCG/ACT inhaler Inhale 2 puffs into the lungs every 6 (six) hours as needed for wheezing or shortness of breath.  06/16/13   [provider]  rosuvastatin (CRESTOR) 10 MG tablet Take 10 mg by mouth at bedtime.     [provider]  triamcinolone (NASACORT ALLERGY 24HR) 55 MCG/ACT AERO nasal inhaler Place 1 spray into the nose daily as needed (for allergies).    [provider]  triamterene-hydrochlorothiazide (MAXZIDE-25) 37.5-25 MG tablet Take 0.5 tablets by mouth daily. 06/18/19   Fay Records, MD    ALLERGIES:  Allergies  Allergen Reactions  .  Codeine Nausea Only  . Morphine And Related Hives  . Sulfa Antibiotics Rash    SOCIAL HISTORY:  Social History   Tobacco Use  . Smoking status: Former Smoker    Years: 10.00    Types: Cigarettes    Quit date: 01/01/1970    Years since quitting: 49.5  . Smokeless tobacco: Never Used  Substance Use Topics  . Alcohol use: Yes    Comment: 1-2 glasses of wine     FAMILY HISTORY: Family History  Problem Relation Age of Onset  . Stroke Mother   . Cancer Father   . Colon cancer Father        38's    EXAM: BP (!) 172/82 (BP Location: Right Arm)   Pulse 100   Temp 97.9 F (36.6 C)   Resp 20   Ht 5\' 4"  (1.626 m)   Wt 64.9 kg   LMP 07/29/1990   SpO2 100%   BMI 24.55 kg/m  CONSTITUTIONAL: Alert and oriented and responds appropriately to questions. Well-appearing; well-nourished, elderly, afebrile, appears uncomfortable but not in distress HEAD: Normocephalic EYES: Conjunctivae clear, pupils appear equal, EOM appear intact ENT: normal nose; moist mucous membranes NECK: Supple, normal ROM CARD: RRR; S1 and S2 appreciated; no murmurs, no clicks, no rubs, no gallops RESP: Normal chest excursion without splinting or tachypnea; breath sounds clear and equal bilaterally; no wheezes, no rhonchi, no rales, no hypoxia or respiratory distress, speaking full sentences ABD/GI: Normal bowel sounds; non-distended; soft, tender throughout the lower abdomen, no rebound, no guarding, no peritoneal signs, no hepatosplenomegaly BACK:  The back appears normal EXT: Normal ROM in all joints; no deformity noted, no edema; no cyanosis SKIN: Normal color for age and race; warm; no rash on exposed skin NEURO: Moves all extremities equally, normal sensation diffusely, normal gait, normal speech PSYCH: The patient's mood and manner are appropriate.   MEDICAL DECISION MAKING: Patient here with urinary retention, constipation.  Has history of previous pelvic organ prolapse requiring robotic assisted  laparoscopic sacrocolpopexy in July.  She denies any prolapse currently.  Does have history of chronic back pain but denies back pain at this time, numbness, tingling or weakness.  No fever.  Low suspicion for cauda equina.  Discussed with patient that this may be urinary tract infection or urinary retention secondary to constipation.  Patient requesting catheterization today.  She does have 335 mL on bladder scan.  Will perform in and out catheter to determine exactly how much urine patient currently is retaining and to perform urinalysis and urine culture.  Discussed with patient that she could subsequently need a Foley catheter.  She is comfortable with this plan.  Will reassess after catheterization to see if pain has improved.  ED PROGRESS: Approximately 321 mL of urine drained after catheterization.  Nurse reports no prolapse seen during catheterization.  Patient reports feeling markedly improved.  Bladder scan  shows no residual urine.  Urine shows no sign of infection.  Repeat abdominal exam is now benign.  Suspect urinary retention may be secondary to her constipation.  Discussed options of Foley catheter at this time but she would prefer discharge home and follow-up with her urologist as an outpatient.  She is aware that urinary retention may return requiring her to come back to the ER for Foley catheter placement.  Again doubt cauda equina, epidural abscess or hematoma, discitis or osteomyelitis or other spinal pathology causing her urinary retention today.  She is on Elavil but review of her records did not show any other medications that could contribute to urinary retention.  Discussed return precautions at length.  Will discharge home.   At this time, I do not feel there is any life-threatening condition present. I have reviewed, interpreted and discussed all results (EKG, imaging, lab, urine as appropriate) and exam findings with patient/family. I have reviewed nursing notes and appropriate  previous records.  I feel the patient is safe to be discharged home without further emergent workup and can continue workup as an outpatient as needed. Discussed usual and customary return precautions. Patient/family verbalize understanding and are comfortable with this plan.  Outpatient follow-up has been provided as needed. All questions have been answered.      Josline Pullis was evaluated in Emergency Department on 07/25/2019 for the symptoms described in the history of present illness. She was evaluated in the context of the global COVID-19 pandemic, which necessitated consideration that the patient might be at risk for infection with the SARS-CoV-2 virus that causes COVID-19. Institutional protocols and algorithms that pertain to the evaluation of patients at risk for COVID-19 are in a state of rapid change based on information released by regulatory bodies including the CDC and federal and state organizations. These policies and algorithms were followed during the patient's care in the ED.  Patient was seen wearing N95, face shield, gloves.        Emilyrose Darrah, Delice Bison, DO 07/25/19 [phone]

## 2019-07-26 LAB — URINE CULTURE: Culture: NO GROWTH

## 2019-08-02 ENCOUNTER — Other Ambulatory Visit: Payer: Self-pay | Admitting: Obstetrics & Gynecology

## 2019-08-04 DIAGNOSIS — H353211 Exudative age-related macular degeneration, right eye, with active choroidal neovascularization: Secondary | ICD-10-CM | POA: Diagnosis not present

## 2019-08-05 LAB — CUP PACEART INCLINIC DEVICE CHECK
Date Time Interrogation Session: 20201103112244
Implantable Pulse Generator Implant Date: 20181004

## 2019-08-10 ENCOUNTER — Ambulatory Visit: Payer: Medicare Other | Attending: Internal Medicine

## 2019-08-10 DIAGNOSIS — Z23 Encounter for immunization: Secondary | ICD-10-CM | POA: Diagnosis not present

## 2019-08-10 NOTE — Progress Notes (Signed)
   Covid-19 Vaccination Clinic  Name:  Jadin Garbisch    MRN: [id number] DOB: Dec 26, 1940  08/10/2019  Ms. Hailstone was observed post Covid-19 immunization for 15 minutes without incidence. She was provided with Vaccine Information Sheet and instruction to access the V-Safe system.   Ms. Moment was instructed to call 911 with any severe reactions post vaccine: Marland Kitchen Difficulty breathing  . Swelling of your face and throat  . A fast heartbeat  . A bad rash all over your body  . Dizziness and weakness    Immunizations Administered    Name Date Dose VIS Date Route   Pfizer COVID-19 Vaccine 08/10/2019 10:43 AM 0.3 mL 07/09/2019 Intramuscular   Manufacturer: [employer]   Lot: [id number]   [place]: [id number]

## 2019-08-23 ENCOUNTER — Ambulatory Visit (INDEPENDENT_AMBULATORY_CARE_PROVIDER_SITE_OTHER): Payer: Medicare Other | Admitting: *Deleted

## 2019-08-23 DIAGNOSIS — G459 Transient cerebral ischemic attack, unspecified: Secondary | ICD-10-CM

## 2019-08-23 LAB — CUP PACEART REMOTE DEVICE CHECK
Date Time Interrogation Session: 20210124232231
Implantable Pulse Generator Implant Date: 20181004

## 2019-08-29 ENCOUNTER — Ambulatory Visit: Payer: Medicare Other | Attending: Internal Medicine

## 2019-08-29 DIAGNOSIS — Z23 Encounter for immunization: Secondary | ICD-10-CM

## 2019-08-29 NOTE — Progress Notes (Signed)
   Covid-19 Vaccination Clinic  Name:  Meka Alessandra    MRN: [id number] DOB: 08-23-1940  08/29/2019  Ms. Marucci was observed post Covid-19 immunization for 15 minutes without incidence. She was provided with Vaccine Information Sheet and instruction to access the V-Safe system.   Ms. Kluger was instructed to call 911 with any severe reactions post vaccine: Marland Kitchen Difficulty breathing  . Swelling of your face and throat  . A fast heartbeat  . A bad rash all over your body  . Dizziness and weakness    Immunizations Administered    Name Date Dose VIS Date Route   Pfizer COVID-19 Vaccine 08/29/2019 10:44 AM 0.3 mL 07/09/2019 Intramuscular   Manufacturer: [place]   Lot: [id number]   [place]: [id number]

## 2019-09-14 DIAGNOSIS — H353211 Exudative age-related macular degeneration, right eye, with active choroidal neovascularization: Secondary | ICD-10-CM | POA: Diagnosis not present

## 2019-09-21 DIAGNOSIS — G500 Trigeminal neuralgia: Secondary | ICD-10-CM | POA: Diagnosis not present

## 2019-09-21 DIAGNOSIS — D320 Benign neoplasm of cerebral meninges: Secondary | ICD-10-CM | POA: Diagnosis not present

## 2019-09-23 ENCOUNTER — Other Ambulatory Visit: Payer: Self-pay | Admitting: Internal Medicine

## 2019-09-23 ENCOUNTER — Ambulatory Visit (INDEPENDENT_AMBULATORY_CARE_PROVIDER_SITE_OTHER): Payer: Medicare Other | Admitting: *Deleted

## 2019-09-23 DIAGNOSIS — G500 Trigeminal neuralgia: Secondary | ICD-10-CM

## 2019-09-23 DIAGNOSIS — G459 Transient cerebral ischemic attack, unspecified: Secondary | ICD-10-CM

## 2019-09-23 DIAGNOSIS — D329 Benign neoplasm of meninges, unspecified: Secondary | ICD-10-CM

## 2019-09-23 LAB — CUP PACEART REMOTE DEVICE CHECK
Date Time Interrogation Session: 20210225000209
Implantable Pulse Generator Implant Date: 20181004

## 2019-09-23 NOTE — Progress Notes (Signed)
ILR Remote 

## 2019-10-12 DIAGNOSIS — D2272 Melanocytic nevi of left lower limb, including hip: Secondary | ICD-10-CM | POA: Diagnosis not present

## 2019-10-12 DIAGNOSIS — D692 Other nonthrombocytopenic purpura: Secondary | ICD-10-CM | POA: Diagnosis not present

## 2019-10-12 DIAGNOSIS — L821 Other seborrheic keratosis: Secondary | ICD-10-CM | POA: Diagnosis not present

## 2019-10-12 DIAGNOSIS — L570 Actinic keratosis: Secondary | ICD-10-CM | POA: Diagnosis not present

## 2019-10-12 DIAGNOSIS — D1801 Hemangioma of skin and subcutaneous tissue: Secondary | ICD-10-CM | POA: Diagnosis not present

## 2019-10-12 DIAGNOSIS — Z85828 Personal history of other malignant neoplasm of skin: Secondary | ICD-10-CM | POA: Diagnosis not present

## 2019-10-13 DIAGNOSIS — E7849 Other hyperlipidemia: Secondary | ICD-10-CM | POA: Diagnosis not present

## 2019-10-13 DIAGNOSIS — R7301 Impaired fasting glucose: Secondary | ICD-10-CM | POA: Diagnosis not present

## 2019-10-13 DIAGNOSIS — E038 Other specified hypothyroidism: Secondary | ICD-10-CM | POA: Diagnosis not present

## 2019-10-20 ENCOUNTER — Ambulatory Visit
Admission: RE | Admit: 2019-10-20 | Discharge: 2019-10-20 | Disposition: A | Discharge status: Home / Self Care | Payer: Medicare Other | Source: Ambulatory Visit | Attending: Internal Medicine | Admitting: Internal Medicine

## 2019-10-20 DIAGNOSIS — D329 Benign neoplasm of meninges, unspecified: Secondary | ICD-10-CM

## 2019-10-20 DIAGNOSIS — D320 Benign neoplasm of cerebral meninges: Secondary | ICD-10-CM | POA: Diagnosis not present

## 2019-10-20 DIAGNOSIS — G500 Trigeminal neuralgia: Secondary | ICD-10-CM

## 2019-10-20 MED ORDER — GADOBENATE DIMEGLUMINE 529 MG/ML IV SOLN
7.0000 mL | Freq: Once | INTRAVENOUS | Status: AC | PRN
  Administered 2019-10-20: 7 mL via INTRAVENOUS

## 2019-10-21 DIAGNOSIS — Z1339 Encounter for screening examination for other mental health and behavioral disorders: Secondary | ICD-10-CM | POA: Diagnosis not present

## 2019-10-21 DIAGNOSIS — E785 Hyperlipidemia, unspecified: Secondary | ICD-10-CM | POA: Diagnosis not present

## 2019-10-21 DIAGNOSIS — H353210 Exudative age-related macular degeneration, right eye, stage unspecified: Secondary | ICD-10-CM | POA: Diagnosis not present

## 2019-10-21 DIAGNOSIS — I10 Essential (primary) hypertension: Secondary | ICD-10-CM | POA: Diagnosis not present

## 2019-10-21 DIAGNOSIS — R110 Nausea: Secondary | ICD-10-CM | POA: Diagnosis not present

## 2019-10-21 DIAGNOSIS — E039 Hypothyroidism, unspecified: Secondary | ICD-10-CM | POA: Diagnosis not present

## 2019-10-21 DIAGNOSIS — J45998 Other asthma: Secondary | ICD-10-CM | POA: Diagnosis not present

## 2019-10-21 DIAGNOSIS — R7301 Impaired fasting glucose: Secondary | ICD-10-CM | POA: Diagnosis not present

## 2019-10-21 DIAGNOSIS — G500 Trigeminal neuralgia: Secondary | ICD-10-CM | POA: Diagnosis not present

## 2019-10-21 DIAGNOSIS — Z1331 Encounter for screening for depression: Secondary | ICD-10-CM | POA: Diagnosis not present

## 2019-10-21 DIAGNOSIS — Z0000 Encounter for general adult medical examination without abnormal findings: Secondary | ICD-10-CM | POA: Diagnosis not present

## 2019-10-21 DIAGNOSIS — Z86010 Personal history of colonic polyps: Secondary | ICD-10-CM | POA: Diagnosis not present

## 2019-10-21 DIAGNOSIS — R82998 Other abnormal findings in urine: Secondary | ICD-10-CM | POA: Diagnosis not present

## 2019-10-21 DIAGNOSIS — D320 Benign neoplasm of cerebral meninges: Secondary | ICD-10-CM | POA: Diagnosis not present

## 2019-10-22 DIAGNOSIS — Z1212 Encounter for screening for malignant neoplasm of rectum: Secondary | ICD-10-CM | POA: Diagnosis not present

## 2019-10-22 DIAGNOSIS — H353211 Exudative age-related macular degeneration, right eye, with active choroidal neovascularization: Secondary | ICD-10-CM | POA: Diagnosis not present

## 2019-10-24 LAB — CUP PACEART REMOTE DEVICE CHECK
Date Time Interrogation Session: 20210328023135
Implantable Pulse Generator Implant Date: 20181004

## 2019-10-25 ENCOUNTER — Ambulatory Visit (INDEPENDENT_AMBULATORY_CARE_PROVIDER_SITE_OTHER): Payer: Medicare Other | Admitting: *Deleted

## 2019-10-25 DIAGNOSIS — G459 Transient cerebral ischemic attack, unspecified: Secondary | ICD-10-CM | POA: Diagnosis not present

## 2019-10-25 NOTE — Progress Notes (Signed)
ILR Remote 

## 2019-11-03 ENCOUNTER — Ambulatory Visit: Payer: Medicare Other | Admitting: Podiatry

## 2019-11-23 DIAGNOSIS — M5116 Intervertebral disc disorders with radiculopathy, lumbar region: Secondary | ICD-10-CM | POA: Diagnosis not present

## 2019-11-23 DIAGNOSIS — M5416 Radiculopathy, lumbar region: Secondary | ICD-10-CM | POA: Diagnosis not present

## 2019-11-25 ENCOUNTER — Ambulatory Visit (INDEPENDENT_AMBULATORY_CARE_PROVIDER_SITE_OTHER): Payer: Medicare Other | Admitting: *Deleted

## 2019-11-25 DIAGNOSIS — G459 Transient cerebral ischemic attack, unspecified: Secondary | ICD-10-CM | POA: Diagnosis not present

## 2019-11-25 LAB — CUP PACEART REMOTE DEVICE CHECK
Date Time Interrogation Session: 20210428230247
Implantable Pulse Generator Implant Date: 20181004

## 2019-11-26 NOTE — Progress Notes (Signed)
ILR Remote 

## 2019-12-01 DIAGNOSIS — H353211 Exudative age-related macular degeneration, right eye, with active choroidal neovascularization: Secondary | ICD-10-CM | POA: Diagnosis not present

## 2019-12-30 ENCOUNTER — Ambulatory Visit (INDEPENDENT_AMBULATORY_CARE_PROVIDER_SITE_OTHER): Payer: Medicare Other | Admitting: *Deleted

## 2019-12-30 DIAGNOSIS — R55 Syncope and collapse: Secondary | ICD-10-CM

## 2019-12-30 LAB — CUP PACEART REMOTE DEVICE CHECK
Date Time Interrogation Session: 20210602230310
Implantable Pulse Generator Implant Date: 20181004

## 2020-01-03 DIAGNOSIS — H35373 Puckering of macula, bilateral: Secondary | ICD-10-CM | POA: Diagnosis not present

## 2020-01-03 DIAGNOSIS — H35453 Secondary pigmentary degeneration, bilateral: Secondary | ICD-10-CM | POA: Diagnosis not present

## 2020-01-03 DIAGNOSIS — H353122 Nonexudative age-related macular degeneration, left eye, intermediate dry stage: Secondary | ICD-10-CM | POA: Diagnosis not present

## 2020-01-03 DIAGNOSIS — H353211 Exudative age-related macular degeneration, right eye, with active choroidal neovascularization: Secondary | ICD-10-CM | POA: Diagnosis not present

## 2020-01-03 DIAGNOSIS — H35363 Drusen (degenerative) of macula, bilateral: Secondary | ICD-10-CM | POA: Diagnosis not present

## 2020-01-03 DIAGNOSIS — Z961 Presence of intraocular lens: Secondary | ICD-10-CM | POA: Diagnosis not present

## 2020-01-04 NOTE — Progress Notes (Signed)
Carelink Summary Report / Loop Recorder 

## 2020-01-05 DIAGNOSIS — H6691 Otitis media, unspecified, right ear: Secondary | ICD-10-CM | POA: Diagnosis not present

## 2020-01-05 DIAGNOSIS — R6884 Jaw pain: Secondary | ICD-10-CM | POA: Diagnosis not present

## 2020-01-05 DIAGNOSIS — I10 Essential (primary) hypertension: Secondary | ICD-10-CM | POA: Diagnosis not present

## 2020-01-05 DIAGNOSIS — H9201 Otalgia, right ear: Secondary | ICD-10-CM | POA: Diagnosis not present

## 2020-02-01 ENCOUNTER — Ambulatory Visit (INDEPENDENT_AMBULATORY_CARE_PROVIDER_SITE_OTHER): Payer: Medicare Other | Admitting: *Deleted

## 2020-02-01 DIAGNOSIS — R55 Syncope and collapse: Secondary | ICD-10-CM

## 2020-02-01 LAB — CUP PACEART REMOTE DEVICE CHECK
Date Time Interrogation Session: 20210706021849
Implantable Pulse Generator Implant Date: 20181004

## 2020-02-02 NOTE — Progress Notes (Signed)
Carelink Summary Report / Loop Recorder 

## 2020-02-03 DIAGNOSIS — R8271 Bacteriuria: Secondary | ICD-10-CM | POA: Diagnosis not present

## 2020-02-07 DIAGNOSIS — Z1231 Encounter for screening mammogram for malignant neoplasm of breast: Secondary | ICD-10-CM | POA: Diagnosis not present

## 2020-02-24 ENCOUNTER — Encounter: Payer: Self-pay | Admitting: Obstetrics & Gynecology

## 2020-03-01 ENCOUNTER — Encounter: Payer: Self-pay | Admitting: Orthopaedic Surgery

## 2020-03-01 ENCOUNTER — Other Ambulatory Visit: Payer: Self-pay

## 2020-03-01 ENCOUNTER — Ambulatory Visit (INDEPENDENT_AMBULATORY_CARE_PROVIDER_SITE_OTHER): Payer: Medicare Other

## 2020-03-01 ENCOUNTER — Ambulatory Visit (INDEPENDENT_AMBULATORY_CARE_PROVIDER_SITE_OTHER): Payer: Medicare Other | Admitting: Orthopaedic Surgery

## 2020-03-01 VITALS — Ht 64.0 in | Wt 142.0 lb

## 2020-03-01 DIAGNOSIS — M25561 Pain in right knee: Secondary | ICD-10-CM | POA: Diagnosis not present

## 2020-03-01 DIAGNOSIS — M1711 Unilateral primary osteoarthritis, right knee: Secondary | ICD-10-CM | POA: Diagnosis not present

## 2020-03-01 DIAGNOSIS — M170 Bilateral primary osteoarthritis of knee: Secondary | ICD-10-CM | POA: Insufficient documentation

## 2020-03-01 MED ORDER — METHYLPREDNISOLONE ACETATE 40 MG/ML IJ SUSP
80.0000 mg | INTRAMUSCULAR | Status: AC | PRN
  Administered 2020-03-01: 80 mg via INTRA_ARTICULAR

## 2020-03-01 MED ORDER — LIDOCAINE HCL 1 % IJ SOLN
2.0000 mL | INTRAMUSCULAR | Status: AC | PRN
  Administered 2020-03-01: 2 mL

## 2020-03-01 MED ORDER — BUPIVACAINE HCL 0.5 % IJ SOLN
2.0000 mL | INTRAMUSCULAR | Status: AC | PRN
  Administered 2020-03-01: 2 mL via INTRA_ARTICULAR

## 2020-03-01 NOTE — Progress Notes (Signed)
Office Visit Note   Patient: Sabrina Parks           Date of Birth: 1941/02/09           MRN: [id number] Visit Date: 03/01/2020              Requested by: Marton Redwood, MD [address] [place],  [place] [address] PCP: Marton Redwood, MD   Assessment & Plan: Visit Diagnoses:  1. Acute pain of right knee   2. Unilateral primary osteoarthritis, right knee     Plan: Tricompartmental degenerative arthritis right knee.  There is an element of CPPD on x-ray.  Long discussion regarding diagnosis and treatment options including use of over-the-counter medicines and exercise.  Will inject the knee with cortisone and monitor response  Follow-Up Instructions: Return if symptoms worsen or fail to improve.   Orders:  Orders Placed This Encounter  Procedures  . Large Joint Inj: R knee  . XR KNEE 3 VIEW RIGHT   No orders of the defined types were placed in this encounter.     Procedures: Large Joint Inj: R knee on 03/01/2020 5:09 PM Indications: pain and diagnostic evaluation Details: 25 G 1.5 in needle, anteromedial approach  Arthrogram: No  Medications: 2 mL lidocaine 1 %; 2 mL bupivacaine 0.5 %; 80 mg methylPREDNISolone acetate 40 MG/ML Procedure, treatment alternatives, risks and benefits explained, specific risks discussed. Consent was given by the patient. Immediately prior to procedure a time out was called to verify the correct patient, procedure, equipment, support staff and site/side marked as required. Patient was prepped and draped in the usual sterile fashion.       Clinical Data: No additional findings.   Subjective: Chief Complaint  Patient presents with  . Right Knee - Pain  Patient presents today for right knee pain. She said that it started about 6 weeks ago following a deep water aerobic class. Her pain is located laterally. No swelling, grinding, or giving way. She said that when standing in one spot it feels like something slides in her knee. She takes Advil  or Tylenol. No previous knee surgery.   HPI  Review of Systems   Objective: Vital Signs: Ht 5\' 4"  (1.626 m)   Wt 142 lb (64.4 kg)   LMP 07/29/1990   BMI 24.37 kg/m   Physical Exam Constitutional:      Appearance: She is well-developed.  Eyes:     Pupils: Pupils are equal, round, and reactive to light.  Pulmonary:     Effort: Pulmonary effort is normal.  Skin:    General: Skin is warm and dry.  Neurological:     Mental Status: She is alert and oriented to person, place, and time.  Psychiatric:        Behavior: Behavior normal.     Ortho Exam awake alert and oriented x3.  No acute distress.  Right knee without effusion.  There is both medial lateral joint pain.  Some patellar crepitation and mild increased varus.  Full extension flexed over 100 degrees without instability.  No popliteal pain or mass.  No calf pain.  Painless range of motion both hips.  Straight leg raise negative  Specialty Comments:  No specialty comments available.  Imaging: XR KNEE 3 VIEW RIGHT  Result Date: 03/01/2020 Films of the right knee are obtained in 3 projections standing.  There are tricompartmental degenerative changes with ectopic calcification within the menisci consistent with CPPD.  Increased varus with decreased medial joint space and subchondral  sclerosis and small peripheral osteophytes.  Lesser changes in the lateral and patellofemoral compartments.  Films are consistent with advanced osteoarthritis.  No acute change    PMFS History: Patient Active Problem List   Diagnosis Date Noted  . Unilateral primary osteoarthritis, right knee 03/01/2020  . Pain due to onychomycosis of toenails of both feet 07/07/2019  . Syncope 06/01/2019  . Left bundle branch block 06/01/2019  . Pelvic prolapse 02/25/2019  . Pain in right hip 02/23/2019  . 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 Lichen sclerosus et atrophicus 06/14/2016  . Hypothyroidism 06/14/2016  . Hypertension   .  Hyperlipidemia   . TIA (transient ischemic attack) 06/12/2012  . Alopecia 08/01/2011   Past Medical History:  Diagnosis Date  . Alopecia   . Arthritis   . Complication of anesthesia   . DJD (degenerative joint disease), cervical 5/02   Elsner  . HSV-1 infection   . Hyperlipidemia   . Hypertension   . Lichen sclerosus 34/28/7681   biopsy proven  . Liver hemangioma 2011   Dr. Earlean Shawl  . PONV (postoperative nausea and vomiting)   . Thyroiditis   . Transient global amnesia    not fully diag    Family History  Problem Relation Age of Onset  . Stroke Mother   . Cancer Father   . Colon cancer Father        58's    Past Surgical History:  Procedure Laterality Date  . APPENDECTOMY  1956  . CARDIOVASCULAR STRESS TEST  10/29/2006   EF 78%.   [place] REPAIR N/A 12/29/2018   Procedure: exam under anesthesia;  Surgeon: Bjorn Loser, MD;  Location: [facility];  Service: Urology;  Laterality: N/A;  . LOOP RECORDER INSERTION N/A 05/01/2017   Procedure: LOOP RECORDER INSERTION;  Surgeon: Evans Lance, MD;  Location: [facility];  Service: Cardiovascular;  Laterality: N/A;  . ROBOTIC ASSISTED LAPAROSCOPIC SACROCOLPOPEXY N/A 02/25/2019   Procedure: XI ROBOTIC ASSISTED LAPAROSCOPIC SACROCOLPOPEXY;  Surgeon: [name], MD;  Location: [facility];  Service: Urology;  Laterality: N/A;  . ROTATOR CUFF REPAIR Bilateral   . SHOULDER SURGERY    . TONSILLECTOMY AND ADENOIDECTOMY  1957  . TOTAL ABDOMINAL HYSTERECTOMY W/ BILATERAL SALPINGOOPHORECTOMY  7/90  . US ECHOCARDIOGRAPHY  10/11/2009   EF 55-60%   Social History   Occupational History  . Not on file  Tobacco Use  . Smoking status: Former Smoker    Years: 10.00    Types: Cigarettes    Quit date: 01/01/1970    Years since quitting: 50.1  . Smokeless tobacco: Never Used  Vaping Use  . Vaping Use: Never used  Substance and Sexual Activity  . Alcohol use: Yes    Comment: 1-2 glasses of wine   . Drug use: No  . Sexual  activity: Yes    Partners: Male    Comment: hysterectomy

## 2020-03-02 DIAGNOSIS — N3000 Acute cystitis without hematuria: Secondary | ICD-10-CM | POA: Diagnosis not present

## 2020-03-02 DIAGNOSIS — R350 Frequency of micturition: Secondary | ICD-10-CM | POA: Diagnosis not present

## 2020-03-02 DIAGNOSIS — N952 Postmenopausal atrophic vaginitis: Secondary | ICD-10-CM | POA: Diagnosis not present

## 2020-03-02 DIAGNOSIS — R338 Other retention of urine: Secondary | ICD-10-CM | POA: Diagnosis not present

## 2020-03-06 ENCOUNTER — Ambulatory Visit (INDEPENDENT_AMBULATORY_CARE_PROVIDER_SITE_OTHER): Payer: Medicare Other | Admitting: *Deleted

## 2020-03-06 DIAGNOSIS — R55 Syncope and collapse: Secondary | ICD-10-CM | POA: Diagnosis not present

## 2020-03-06 LAB — CUP PACEART REMOTE DEVICE CHECK
Date Time Interrogation Session: 20210808021839
Implantable Pulse Generator Implant Date: 20181004

## 2020-03-07 NOTE — Progress Notes (Signed)
Carelink Summary Report / Loop Recorder 

## 2020-03-16 DIAGNOSIS — Z1152 Encounter for screening for COVID-19: Secondary | ICD-10-CM | POA: Diagnosis not present

## 2020-03-16 DIAGNOSIS — M542 Cervicalgia: Secondary | ICD-10-CM | POA: Diagnosis not present

## 2020-03-16 DIAGNOSIS — M25512 Pain in left shoulder: Secondary | ICD-10-CM | POA: Diagnosis not present

## 2020-03-20 DIAGNOSIS — C4442 Squamous cell carcinoma of skin of scalp and neck: Secondary | ICD-10-CM | POA: Diagnosis not present

## 2020-03-20 DIAGNOSIS — S80819A Abrasion, unspecified lower leg, initial encounter: Secondary | ICD-10-CM | POA: Diagnosis not present

## 2020-03-20 DIAGNOSIS — H353211 Exudative age-related macular degeneration, right eye, with active choroidal neovascularization: Secondary | ICD-10-CM | POA: Diagnosis not present

## 2020-03-20 DIAGNOSIS — Z85828 Personal history of other malignant neoplasm of skin: Secondary | ICD-10-CM | POA: Diagnosis not present

## 2020-03-20 DIAGNOSIS — L570 Actinic keratosis: Secondary | ICD-10-CM | POA: Diagnosis not present

## 2020-03-26 NOTE — Progress Notes (Signed)
Cardiology Office Note   Date:  03/26/2020   ID:  Sabrina Parks, DOB [birth year], MRN [id number]  PCP:  Marton Redwood, MD  Cardiologist:   Dorris Carnes, MD   F/U of HTN     History of Present Illness: Sabrina Parks is a 79 y.o. female with a history of HTN, HL and palpitations and one episode of global amnesia (2013).  Stress test 2011 negaitve for ischemia.  Echo Nov 2013 mild LVH.  LVEF 65 to 23% with Gr I diastlic dysfunc.   MRA neg  Carotid USN neg  I saw her in March 2018  LBBB block on EKG new  Myovue normal  Also had one episode of syncope   Now has ILR and is followed by Beckie Salts    I last saw the patient back in Nov 2020    She remains fairly active   Still going to [facility] for water aerobics   Doing some walking  Notes occasoinal palpitations   No syncope  No severe dizziness Breathing is OK     No outpatient medications have been marked as taking for the 03/27/20 encounter (Appointment) with Fay Records, MD.     Allergies:   Codeine, Morphine and related, and Sulfa antibiotics   Past Medical History:  Diagnosis Date   Alopecia    Arthritis    Complication of anesthesia    DJD (degenerative joint disease), cervical 5/02   [facility]lsner   HSV-1 infection    Hyperlipidemia    Hypertension    Lichen sclerosus 76/28/3151   biopsy proven   Liver hemangioma 2011   Dr. Earlean Shawl   PONV (postoperative nausea and vomiting)    Thyroiditis    Transient global amnesia    not fully diag    Past Surgical History:  Procedure Laterality Date   APPENDECTOMY  1956   CARDIOVASCULAR STRESS TEST  10/29/2006   EF 78%.    [place] REPAIR N/A 12/29/2018   Procedure: exam under anesthesia;  Surgeon: Bjorn Loser, MD;  [facility];  Service: Urology;  Laterality: N/A;   LOOP RECORDER INSERTION N/A 05/01/2017   Procedure: LOOP RECORDER INSERTION;  Surgeon: Evans Lance, MD;  Location: [facility];  Service: Cardiovascular;  Laterality: N/A;   ROBOTIC  ASSISTED LAPAROSCOPIC SACROCOLPOPEXY N/A 02/25/2019   Procedure: XI ROBOTIC ASSISTED LAPAROSCOPIC SACROCOLPOPEXY;  Surgeon: [name], MD;  [facility];  Service: Urology;  Laterality: N/A;   ROTATOR CUFF REPAIR Bilateral    SHOULDER SURGERY     TONSILLECTOMY AND ADENOIDECTOMY  1957   TOTAL ABDOMINAL HYSTERECTOMY W/ BILATERAL SALPINGOOPHORECTOMY  7/90   US ECHOCARDIOGRAPHY  10/11/2009   EF 55-60%     Social History:  The patient  reports that she quit smoking about 50 years ago. Her smoking use included cigarettes. She quit after 10.00 years of use. She has never used smokeless tobacco. She reports current alcohol use. She reports that she does not use drugs.   Family History:  The patient's family history includes Cancer in her father; Colon cancer in her father; Stroke in her mother.    ROS:  Please see the history of present illness. All other systems are reviewed and  Negative to the above problem except as noted.    PHYSICAL EXAM: VS:  LMP 07/29/1990    BP 138/68  P 91  Wt 141 lbs  GEN: Well nourished, well developed, in no acute distress  HEENT: normal  Neck: JVP normal  No carotid bruits, Cardiac: RRR; no murmurs;  No LE edema  Respiratory:  clear to auscultation bilaterally, normal work of breathing GI: soft, nontender, nondistended, + BS  No hepatomegaly  MS: no deformity Moving all extremities   Skin: warm and dry, no rash  Pt wearing hat had Bx on scalp Neuro:  Strength and sensation are intact Psych: euthymic mood, full affect   EKG:  EKG is done   SR 91 bpm  LBBB  Lipid Panel    Component Value Date/Time   CHOL 199 12/18/2018 1356   TRIG 166 (H) 12/18/2018 1356   HDL 74 12/18/2018 1356   CHOLHDL 2.7 12/18/2018 1356   LDLCALC 92 12/18/2018 1356      Wt Readings from Last 3 Encounters:  03/01/20 142 lb (64.4 kg)  07/25/19 143 lb (64.9 kg)  06/18/19 148 lb 3.2 oz (67.2 kg)      ASSESSMENT AND PLAN:  1  Dizziness.Pt denies  signif  problmes  Has LINQ in place  2  Palpitations  / heart racing     No significant spells   Again, has LINQ  3  LBBB  Old    4  HL  Will repeat lipids today    5  HTN   Continue current regimne   Follow   F/U in clinicin about 9 months   Current medicines are reviewed at length with the patient today.  The patient does not have concerns regarding medicines.  Signed, Dorris Carnes, MD  03/26/2020 10:53 PM    [facility] [place], [place], [place]  [address] Phone: [phone]; Fax: [phone]

## 2020-03-27 ENCOUNTER — Ambulatory Visit (INDEPENDENT_AMBULATORY_CARE_PROVIDER_SITE_OTHER): Payer: Medicare Other | Admitting: Internal Medicine

## 2020-03-27 ENCOUNTER — Other Ambulatory Visit: Payer: Self-pay

## 2020-03-27 ENCOUNTER — Encounter: Payer: Self-pay | Admitting: Internal Medicine

## 2020-03-27 VITALS — BP 138/68 | HR 91 | Ht 64.0 in | Wt 141.6 lb

## 2020-03-27 DIAGNOSIS — E785 Hyperlipidemia, unspecified: Secondary | ICD-10-CM

## 2020-03-27 LAB — LIPID PANEL
Chol/HDL Ratio: 2.5 ratio (ref 0.0–4.4)
Cholesterol, Total: 224 mg/dL — ABNORMAL HIGH (ref 100–199)
HDL: 91 mg/dL (ref >39)
LDL Chol Calc (NIH): 107 mg/dL — ABNORMAL HIGH (ref 0–99)
Triglycerides: 152 mg/dL — ABNORMAL HIGH (ref 0–149)
VLDL Cholesterol Cal: 26 mg/dL (ref 5–40)

## 2020-03-27 NOTE — Patient Instructions (Addendum)
Medication Instructions:  No changes *If you need a refill on your cardiac medications before your next appointment, please call your pharmacy*   Lab Work: LIPIDS TODAY If you have labs (blood work) drawn today and your tests are completely normal, you will receive your results only by: Marland Kitchen MyChart Message (if you have MyChart) OR . A paper copy in the mail If you have any lab test that is abnormal or we need to change your treatment, we will call you to review the results.   Testing/Procedures: Lipid panel   Follow-Up: At [facility], you and your health needs are our priority.  As part of our continuing mission to provide you with exceptional heart care, we have created designated Provider Care Teams.  These Care Teams include your primary Cardiologist (physician) and Advanced Practice Providers (APPs -  Physician Assistants and Nurse Practitioners) who all work together to provide you with the care you need, when you need it.  We recommend signing up for the patient portal called "MyChart".  Sign up information is provided on this After Visit Summary.  MyChart is used to connect with patients for Virtual Visits (Telemedicine).  Patients are able to view lab/test results, encounter notes, upcoming appointments, etc.  Non-urgent messages can be sent to your provider as well.   To learn more about what you can do with MyChart, go to [email or web address].    Your next appointment:   9 month(s)  The format for your next appointment:   In Person  Provider:   You may see Dorris Carnes, MD or one of the following Advanced Practice Providers on your designated Care Team:    Richardson Dopp, PA-C  Robbie Lis, [place]    Other Instructions

## 2020-03-28 DIAGNOSIS — H9311 Tinnitus, right ear: Secondary | ICD-10-CM | POA: Diagnosis not present

## 2020-03-28 DIAGNOSIS — H6981 Other specified disorders of Eustachian tube, right ear: Secondary | ICD-10-CM | POA: Diagnosis not present

## 2020-03-29 ENCOUNTER — Telehealth: Payer: Self-pay | Admitting: *Deleted

## 2020-03-29 DIAGNOSIS — E785 Hyperlipidemia, unspecified: Secondary | ICD-10-CM

## 2020-03-29 MED ORDER — ROSUVASTATIN CALCIUM 20 MG PO TABS: 20.0000 mg | ORAL_TABLET | Freq: Every day | ORAL | 3 refills | Status: DC

## 2020-03-29 NOTE — Telephone Encounter (Signed)
-----   Message from Fay Records, MD sent at 03/28/2020  4:45 PM EDT ----- Sabrina Parks to patient about lipids   She has been better   Recomm:  Increase Crestor to 20 mg     Check lipid panel in Dec 2021

## 2020-03-29 NOTE — Telephone Encounter (Signed)
Placed order for Crestor 20 mg and lipids in 3 months.

## 2020-04-04 ENCOUNTER — Ambulatory Visit: Payer: Self-pay

## 2020-04-04 ENCOUNTER — Ambulatory Visit: Payer: Medicare Other | Attending: Internal Medicine

## 2020-04-04 DIAGNOSIS — Z23 Encounter for immunization: Secondary | ICD-10-CM

## 2020-04-04 NOTE — Progress Notes (Signed)
   Covid-19 Vaccination Clinic  Name:  Sabrina Parks    MRN: [id number] DOB: 12-05-1940  04/04/2020  [name] was observed post Covid-19 immunization for 15 minutes without incident. She was provided with Vaccine Information Sheet and instruction to access the V-Safe system.   [name] was instructed to call 911 with any severe reactions post vaccine: Marland Kitchen Difficulty breathing  . Swelling of face and throat  . A fast heartbeat  . A bad rash all over body  . Dizziness and weakness

## 2020-04-10 ENCOUNTER — Ambulatory Visit (INDEPENDENT_AMBULATORY_CARE_PROVIDER_SITE_OTHER): Payer: Medicare Other | Admitting: *Deleted

## 2020-04-10 DIAGNOSIS — R55 Syncope and collapse: Secondary | ICD-10-CM | POA: Diagnosis not present

## 2020-04-10 LAB — CUP PACEART REMOTE DEVICE CHECK
Date Time Interrogation Session: 20210910022154
Implantable Pulse Generator Implant Date: 20181004

## 2020-04-11 ENCOUNTER — Ambulatory Visit: Payer: Medicare Other | Attending: Internal Medicine | Admitting: Physical Therapy

## 2020-04-11 ENCOUNTER — Encounter: Payer: Self-pay | Admitting: Physical Therapy

## 2020-04-11 ENCOUNTER — Other Ambulatory Visit: Payer: Self-pay

## 2020-04-11 DIAGNOSIS — R293 Abnormal posture: Secondary | ICD-10-CM | POA: Insufficient documentation

## 2020-04-11 DIAGNOSIS — M542 Cervicalgia: Secondary | ICD-10-CM | POA: Diagnosis not present

## 2020-04-11 DIAGNOSIS — Z85828 Personal history of other malignant neoplasm of skin: Secondary | ICD-10-CM | POA: Diagnosis not present

## 2020-04-11 DIAGNOSIS — C4442 Squamous cell carcinoma of skin of scalp and neck: Secondary | ICD-10-CM | POA: Diagnosis not present

## 2020-04-11 DIAGNOSIS — M25512 Pain in left shoulder: Secondary | ICD-10-CM | POA: Diagnosis not present

## 2020-04-11 DIAGNOSIS — L570 Actinic keratosis: Secondary | ICD-10-CM | POA: Diagnosis not present

## 2020-04-11 DIAGNOSIS — D692 Other nonthrombocytopenic purpura: Secondary | ICD-10-CM | POA: Diagnosis not present

## 2020-04-11 NOTE — Therapy (Addendum)
[place], [place], [address] Phone: [phone]   Fax:  [phone]  Physical Therapy Evaluation and Discharge  Patient Details  Name: Sabrina Parks MRN: [id number] Date of Birth: Dec 18, 1940 Referring Provider (PT): Marton Redwood, MD   Encounter Date: 04/11/2020   PT End of Session - 04/11/20 1624    Visit Number 1    Number of Visits 4    Date for PT Re-Evaluation 05/09/20    Authorization Type Medicare -- 10th visit progress note; 6th visit FOTO    PT Start Time 1536    PT Stop Time 1618    PT Time Calculation (min) 42 min    Activity Tolerance Patient tolerated treatment well    Behavior During Therapy [facility] for tasks assessed/performed           Past Medical History:  Diagnosis Date  . Alopecia   . Arthritis   . Complication of anesthesia   . DJD (degenerative joint disease), cervical 5/02   Elsner  . HSV-1 infection   . Hyperlipidemia   . Hypertension   . Lichen sclerosus 17/61/6073   biopsy proven  . Liver hemangioma 2011   Dr. Earlean Shawl  . PONV (postoperative nausea and vomiting)   . Thyroiditis   . Transient global amnesia    not fully diag    Past Surgical History:  Procedure Laterality Date  . APPENDECTOMY  1956  . CARDIOVASCULAR STRESS TEST  10/29/2006   EF 78%.   [place] REPAIR N/A 12/29/2018   Procedure: exam under anesthesia;  Surgeon: Bjorn Loser, MD;  [facility]: [facility];  Service: Urology;  Laterality: N/A;  . LOOP RECORDER INSERTION N/A 05/01/2017   Procedure: LOOP RECORDER INSERTION;  Surgeon: Evans Lance, MD;  Location: [facility];  Service: Cardiovascular;  Laterality: N/A;  . ROBOTIC ASSISTED LAPAROSCOPIC SACROCOLPOPEXY N/A 02/25/2019   Procedure: XI ROBOTIC ASSISTED LAPAROSCOPIC SACROCOLPOPEXY;  Surgeon: [name], MD;  Location[facility];  Service: Urology;  Laterality: N/A;  . ROTATOR CUFF REPAIR Bilateral   . SHOULDER SURGERY    .  TONSILLECTOMY AND ADENOIDECTOMY  1957  . TOTAL ABDOMINAL HYSTERECTOMY W/ BILATERAL SALPINGOOPHORECTOMY  7/90  . US ECHOCARDIOGRAPHY  10/11/2009   EF 55-60%    There were no vitals filed for this visit.    Subjective Assessment - 04/11/20 1540    Subjective Pt reports she's here due to neck pain. Pt states her neck has been getting better; however, she lifted a TV ~1 month ago and strained her neck down to her shoulder (L shoulder worse than R). Pt reports difficulty sleeping at night.    Pertinent History back pain, bilat RTC repairs    Limitations Lifting;House hold activities    How long can you sit comfortably? n/a    How long can you stand comfortably? n/a    How long can you walk comfortably? n/a    Patient Stated Goals Reduce pain with lifting and at rest    Currently in Pain? Yes    Pain Score 5     Pain Location Neck    Pain Orientation Left    Pain Descriptors / Indicators Aching;Other (Comment)   pinch   Pain Type Acute pain    Pain Radiating Towards L shoulder    Pain Onset 1 to 4 weeks ago    Pain Frequency Constant    Aggravating Factors  Lifting, laying down    Pain Relieving Factors Bengay, heat  [facility] PT Assessment - 04/11/20 0001      Assessment   Medical Diagnosis M54.2 (ICD-10-CM) - Cervicalgia    Referring Provider (PT) Marton Redwood, MD    Onset Date/Surgical Date 03/11/20    Prior Therapy Neck pain 20 yrs ago      Balance Screen   Has the patient fallen in the past 6 months No      [place] residence    Living Arrangements Spouse/significant other      Prior Function   Level of [place] Retired    Psychologist, forensic classes at the [employer]      Observation/Other [employer] on Therapeutic Outcomes (FOTO)  53%      Posture/Postural Control   Posture/Postural Control Postural limitations    Postural Limitations Forward head      AROM   Cervical Flexion WFL     Cervical Extension 25%     Cervical - Right Side Bend 50%   pt reports this is baseline   Cervical - Left Side Bend 50%   pt states this is baseline   Cervical - Right Rotation WFL    Cervical - Left Rotation [facility]      Strength   Right Shoulder Flexion 4+/5    Right Shoulder ABduction 4+/5    Right Shoulder Internal Rotation 4+/5    Right Shoulder External Rotation 4+/5    Left Shoulder Flexion 4+/5    Left Shoulder ABduction 4+/5    Left Shoulder Internal Rotation 4+/5    Left Shoulder External Rotation 3+/5    Right Elbow Flexion 4+/5    Right Elbow Extension 4+/5    Left Elbow Flexion 4+/5    Left Elbow Extension 4+/5      Spurling's   Findings Negative      Distraction Test   Findngs Negative    Comment felt good                      Objective measurements completed on examination: See above findings.       [place] Adult PT Treatment/Exercise - 04/11/20 0001      Neck Exercises: Seated   Neck Retraction 10 reps      Shoulder Exercises: Seated   Retraction Both;10 reps    External Rotation Strengthening;Both;10 reps;Theraband    Theraband Level (Shoulder External Rotation) Level 2 (Red)      Neck Exercises: Stretches   Other Neck Stretches first rib mobilization x10    Other Neck Stretches scalene stretch with towel x 30 sec                  PT Education - 04/11/20 1632    Education Details Exam findings, POC, HEP.    Person(s) Educated Patient    Methods Explanation;Demonstration;Tactile cues;Verbal cues;Handout    Comprehension Verbalized understanding;Returned demonstration;Verbal cues required;Tactile cues required               PT Long Term Goals - 04/11/20 1637      PT LONG TERM GOAL #1   Title Pt will be independent with initial HEP    Baseline Newly provided    Time 4    Period Weeks    Status New    Target Date 05/09/20      PT LONG TERM GOAL #2   Title Pt will have </=50% neck ROM restriction in all directions for  her daily  tasks    Baseline 25% of neck extension; 50% R & L side bending (pt believes this is baseline -- she notes this movement has always been restricted even while performing them in aquatics classes)    Time 4    Period Weeks    Status New    Target Date 05/09/20      PT LONG TERM GOAL #3   Title Pt will be able to have a full night's sleep without discomfort    Baseline Reports worst pain at night    Time 4    Period Weeks    Status New    Target Date 05/09/20      PT LONG TERM GOAL #4   Title Pt will have improved FOTO score to </=41%    Baseline 53%    Time 4    Period Weeks    Status New    Target Date 05/09/20                  Plan - 04/11/20 1625    Clinical Impression Statement Pt is a 79 y/o F presenting to OPPT due to complaint of neck strain radiating to her L shoulder. Pt notes that 1 month ago she was helping to lift a TV when she felt a strain in her neck that radiated down to her shoulder and since then has had ongoing pain for ~1 month. Pt reports pain has decreased since the initial injury. Pt found to have elevated L first rib with tight scalenes, suboccipitals, and TTP upper L upper trap limiting her neck ROM. Pt would benefit from PT to address these issues to return her to her PLOF and comfort.    Personal Factors and Comorbidities Age;Time since onset of injury/illness/exacerbation    Examination-Activity Limitations Carry;Lift;Sleep    Examination-Participation Restrictions Cleaning    Clinical Decision Making Low    Rehab Potential Good    PT Frequency 1x / week    PT Duration 4 weeks    PT Treatment/Interventions ADLs/Self Care Home Management;Electrical Stimulation;Cryotherapy;Iontophoresis 67m/ml Dexamethasone;Moist Heat;Ultrasound;Traction;Functional mobility training;Therapeutic activities;Therapeutic exercise;Neuromuscular re-education;Patient/family education;Manual techniques;Passive range of motion;Dry needling;Taping    PT Next Visit  Plan Assess response to HEP. Continue manual therapy as needed for her neck/shoulder. Consider traction machine. Continue stretching and strengthening of neck/scapula/shoulder.    PT Home Exercise Plan Access Code [email or web address]    Consulted and Agree with Plan of Care Patient           PHYSICAL THERAPY DISCHARGE SUMMARY  Visits from Start of Care: 1  Current functional level related to goals / functional outcomes: Pt called to ask to cancel the rest of her appointments due to being pleased with her level of function after her first session   Remaining deficits: Pt feels back to baseline; unknown if her PT goals have been met but pt's personal goals have been.   Education / Equipment: Discussed with pt to call if there are any flare ups or questions in regards to her neck/shoulder pain.  Plan: Patient agrees to discharge.  Patient goals were partially met. Patient is being discharged due to being pleased with the current functional level.  ?????         Patient will benefit from skilled therapeutic intervention in order to improve the following deficits and impairments:  Decreased range of motion, Increased fascial restricitons, Pain, Hypomobility, Postural dysfunction, Decreased mobility  Visit Diagnosis: Cervicalgia  Acute pain of left shoulder  Abnormal posture  Problem List Patient Active Problem List   Diagnosis Date Noted  . Unilateral primary osteoarthritis, right knee 03/01/2020  . Pain due to onychomycosis of toenails of both feet 07/07/2019  . Syncope 06/01/2019  . Left bundle branch block 06/01/2019  . Pelvic prolapse 02/25/2019  . Pain in right hip 02/23/2019  . 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 Lichen sclerosus et atrophicus 06/14/2016  . Hypothyroidism 06/14/2016  . Hypertension   . Hyperlipidemia   . TIA (transient ischemic attack) 06/12/2012  . Alopecia 08/01/2011    Sabrina Parks April Ma L Dewanda Fennema PT, DPT 04/11/2020, 4:43 PM  [facility] [address] [place], [place], [address] Phone: [phone]   Fax:  [phone]  Name: Sabrina Parks MRN: [id number] Date of Birth: 04-24-1941

## 2020-04-11 NOTE — Patient Instructions (Signed)
Access Code: [email or web address] URL: [email or web address] Date: 04/11/2020 Prepared by: Estill Bamberg April Thurnell Garbe  Exercises First Rib Mobilization with Strap - 1 x daily - 7 x weekly - 1 sets - 10 reps - 2 sec hold Seated Scalene Stretch with Towel - 1 x daily - 7 x weekly - 3 sets - 20-30 sec hold Standing Cervical Retraction - 1 x daily - 7 x weekly - 1 sets - 10 reps - 2-3 sec hold Seated Scapular Retraction - 1 x daily - 7 x weekly - 3 sets - 10 reps Shoulder External Rotation and Scapular Retraction with Resistance - 1 x daily - 7 x weekly - 2 sets - 10 reps

## 2020-04-12 DIAGNOSIS — C4442 Squamous cell carcinoma of skin of scalp and neck: Secondary | ICD-10-CM | POA: Diagnosis not present

## 2020-04-12 DIAGNOSIS — Z85828 Personal history of other malignant neoplasm of skin: Secondary | ICD-10-CM | POA: Diagnosis not present

## 2020-04-12 NOTE — Progress Notes (Signed)
Carelink Summary Report / Loop Recorder 

## 2020-04-13 ENCOUNTER — Ambulatory Visit: Payer: Medicare Other | Admitting: Orthopaedic Surgery

## 2020-04-18 ENCOUNTER — Ambulatory Visit: Payer: Medicare Other

## 2020-04-18 ENCOUNTER — Encounter: Payer: Self-pay | Admitting: Orthopedic Surgery

## 2020-04-18 ENCOUNTER — Ambulatory Visit (INDEPENDENT_AMBULATORY_CARE_PROVIDER_SITE_OTHER): Payer: Medicare Other | Admitting: Orthopedic Surgery

## 2020-04-18 ENCOUNTER — Other Ambulatory Visit: Payer: Self-pay

## 2020-04-18 VITALS — Ht 64.0 in | Wt 142.0 lb

## 2020-04-18 DIAGNOSIS — M7061 Trochanteric bursitis, right hip: Secondary | ICD-10-CM

## 2020-04-18 DIAGNOSIS — M25551 Pain in right hip: Secondary | ICD-10-CM

## 2020-04-18 MED ORDER — METHYLPREDNISOLONE ACETATE 40 MG/ML IJ SUSP
80.0000 mg | INTRAMUSCULAR | Status: AC | PRN
  Administered 2020-04-18: 80 mg via INTRA_ARTICULAR

## 2020-04-18 MED ORDER — LIDOCAINE HCL 1 % IJ SOLN
2.0000 mL | INTRAMUSCULAR | Status: AC | PRN
  Administered 2020-04-18: 2 mL

## 2020-04-18 MED ORDER — BUPIVACAINE HCL 0.25 % IJ SOLN
2.0000 mL | INTRAMUSCULAR | Status: AC | PRN
  Administered 2020-04-18: 2 mL via INTRA_ARTICULAR

## 2020-04-18 NOTE — Progress Notes (Signed)
Office Visit Note   Patient: Sabrina Parks           Date of Birth: [birth year]           MRN: [id number] Visit Date: 04/18/2020              Requested by: Marton Redwood, MD [address] [place],  [place] [address] PCP: Marton Redwood, MD   Assessment & Plan: Visit Diagnoses:  1. Pain in right hip   2. Trochanteric bursitis, right hip     Plan:  #1: The right trochanteric area was injected without difficulty.  Postinjection she had no pain. #2: She will take it easy today use some ice as needed. #3: Follow back up as needed.  Follow-Up Instructions: No follow-ups on file.   Orders:  No orders of the defined types were placed in this encounter.  No orders of the defined types were placed in this encounter.     Procedures: Large Joint Inj: R greater trochanter on 04/18/2020 11:51 AM Indications: pain and diagnostic evaluation Details: 25 G 1.5 in needle, lateral approach  Arthrogram: No  Medications: 2 mL lidocaine 1 %; 80 mg methylPREDNISolone acetate 40 MG/ML; 2 mL bupivacaine 0.25 % Procedure, treatment alternatives, risks and benefits explained, specific risks discussed. Consent was given by the patient. Immediately prior to procedure a time out was called to verify the correct patient, procedure, equipment, support staff and site/side marked as required. Patient was prepped and draped in the usual sterile fashion.       Clinical Data: No additional findings.   Subjective: Chief Complaint  Patient presents with  . Right Hip - Pain   HPI Patient presents today for her recurrent right hip pain. She was last here on July 28 of 2020 and received a cortisone injection at the greater troch. She states that she started hurting again about a month ago. Her pain is located laterally, no groin pain. No numbness or tingling. She takes Advil for pain and states that it helps. If she lays on that side at night, she wakes due to pain.    Review of Systems    Constitutional: Negative for fatigue.  Eyes: Negative for pain.  Respiratory: Negative for shortness of breath.   Cardiovascular: Negative for leg swelling.  Gastrointestinal: Positive for constipation. Negative for diarrhea.  Endocrine: Negative for cold intolerance and heat intolerance.  Genitourinary: Negative for difficulty urinating.  Musculoskeletal: Positive for joint swelling.  Skin: Negative for rash.  Allergic/Immunologic: Negative for food allergies.  Neurological: Negative for weakness.  Hematological: Does not bruise/bleed easily.  Psychiatric/Behavioral: Positive for sleep disturbance.     Objective: Vital Signs: Ht 5\' 4"  (1.626 m)   Wt 142 lb (64.4 kg)   LMP 07/29/1990   BMI 24.37 kg/m   Physical Exam Constitutional:      Appearance: Normal appearance. She is well-developed and normal weight.  HENT:     Head: Normocephalic.  Eyes:     Pupils: Pupils are equal, round, and reactive to light.  Pulmonary:     Effort: Pulmonary effort is normal.  Skin:    General: Skin is warm and dry.  Neurological:     Mental Status: She is alert and oriented to person, place, and time.  Psychiatric:        Behavior: Behavior normal.      Ortho Exam   Specialty Comments:  No specialty comments available.  Imaging: No results found.   PMFS History: Patient Active Problem List  Diagnosis Date Noted  . Trochanteric bursitis, right hip 04/18/2020  . Unilateral primary osteoarthritis, right knee 03/01/2020  . Pain due to onychomycosis of toenails of both feet 07/07/2019  . Syncope 06/01/2019  . Left bundle branch block 06/01/2019  . Pelvic prolapse 02/25/2019  . Pain in right hip 02/23/2019  . 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 Lichen sclerosus et atrophicus 06/14/2016  . Hypothyroidism 06/14/2016  . Hypertension   . Hyperlipidemia   . TIA (transient ischemic attack) 06/12/2012  . Alopecia 08/01/2011   Past Medical History:  Diagnosis Date  .  Alopecia   . Arthritis   . Complication of anesthesia   . DJD (degenerative joint disease), cervical 5/02   [facility]ner  . HSV-1 infection   . Hyperlipidemia   . Hypertension   . Lichen sclerosus 51/08/5850   biopsy proven  . Liver hemangioma 2011   Dr. Earlean Shawl  . PONV (postoperative nausea and vomiting)   . Thyroiditis   . Transient global amnesia    not fully diag    Family History  Problem Relation Age of Onset  . Stroke Mother   . Cancer Father   . Colon cancer Father        70's    Past Surgical History:  Procedure Laterality Date  . APPENDECTOMY  1956  . CARDIOVASCULAR STRESS TEST  10/29/2006   EF 78%.   Lester [place] REPAIR N/A 12/29/2018   Procedure: exam under anesthesia;  Surgeon: Bjorn Loser, MD;  Location: [facility];  Service: Urology;  Laterality: N/A;  . LOOP RECORDER INSERTION N/A 05/01/2017   Procedure: LOOP RECORDER INSERTION;  Surgeon: Evans Lance, MD;  Location: [facility];  Service: Cardiovascular;  Laterality: N/A;  . ROBOTIC ASSISTED LAPAROSCOPIC SACROCOLPOPEXY N/A 02/25/2019   Procedure: XI ROBOTIC ASSISTED LAPAROSCOPIC SACROCOLPOPEXY;  Surgeon: [name], MD;  Location: [facility];  Service: Urology;  Laterality: N/A;  . ROTATOR CUFF REPAIR Bilateral   . SHOULDER SURGERY    . TONSILLECTOMY AND ADENOIDECTOMY  1957  . TOTAL ABDOMINAL HYSTERECTOMY W/ BILATERAL SALPINGOOPHORECTOMY  7/90  . US ECHOCARDIOGRAPHY  10/11/2009   EF 55-60%   Social History   Occupational History  . Not on file  Tobacco Use  . Smoking status: Former Smoker    Years: 10.00    Types: Cigarettes    Quit date: 01/01/1970    Years since quitting: 50.3  . Smokeless tobacco: Never Used  Vaping Use  . Vaping Use: Never used  Substance and Sexual Activity  . Alcohol use: Yes    Comment: 1-2 glasses of wine   . Drug use: No  . Sexual activity: Yes    Partners: Male    Comment: hysterectomy

## 2020-04-24 ENCOUNTER — Telehealth: Payer: Self-pay

## 2020-04-24 NOTE — Telephone Encounter (Signed)
The pt asked if she should take her monitor with her on vacation. I told her if she is going to be gone for 5 or more days to take the monitor with her. She asked if she can drive? I told her it did not look like she had any restrictions.

## 2020-04-24 NOTE — Telephone Encounter (Signed)
Carelink alert received for Pause episode of 3 seconds on 04/21/20 at 10:15am.  Pt implanted for syncope.  No documented history of pauses.    Spoke with pt, she confirmed that she would have been awake during the time but does not recall what she was doing.  She declines any symptoms during that day.    Forwarding to MD since this was a daytime pause and no documented history.  Advised pt anticipate no changes, will continue to monitor.

## 2020-04-28 ENCOUNTER — Telehealth: Payer: Self-pay

## 2020-04-28 DIAGNOSIS — Z4802 Encounter for removal of sutures: Secondary | ICD-10-CM | POA: Diagnosis not present

## 2020-04-28 NOTE — Telephone Encounter (Signed)
[employer] alert received for 1 symptom episode on 04/24/20 @ 22:06, appears ST.   Called patient to assess. Patient reports of palpitations right before bedtime. States she had not done anything different that day other [place]. Denies shortness of breath or chest pain during episdoe. Reports compliance with medications.   Routing to Dr. Lovena Le for review. Advised patient if she does not hear anything back, there will not be any changes.   Please call patient at [phone]

## 2020-05-02 NOTE — Telephone Encounter (Signed)
Looks ok to me. No change

## 2020-05-04 ENCOUNTER — Telehealth: Payer: Self-pay

## 2020-05-04 NOTE — Telephone Encounter (Signed)
Carelink alert received 05/04/20 for 1 symptom event detected on 05/03/20 @ 21:49. Appears ST 110 w/ TWOS.   Called patient to assess s/s, medication compliance.  No answer, LMOVM.

## 2020-05-05 NOTE — Telephone Encounter (Signed)
Patient returning phone call. Patient reports she started to feel lightheaded and dizziness while sitting at a table while playing a game. States she went to lay down, put cloth on head, checked BP systolic 208. Reports she felt better after. Reports compliance with medications. Advsied patient I will forward to Dr. Lovena Le and if he has any changes we will call her. Patient advised if he experiences shortness of breath, syncope or chest pain to go to the ED, do not drive. Verbalized understanding.  Routing to Dr. Lovena Le for review and recommendations.

## 2020-05-08 NOTE — Telephone Encounter (Signed)
If SBP over 170, take an extra half tab of HCTZ.

## 2020-05-09 ENCOUNTER — Ambulatory Visit: Payer: Medicare Other | Admitting: Orthopaedic Surgery

## 2020-05-10 LAB — CUP PACEART REMOTE DEVICE CHECK
Date Time Interrogation Session: 20211013022219
Implantable Pulse Generator Implant Date: 20181004

## 2020-05-11 DIAGNOSIS — M419 Scoliosis, unspecified: Secondary | ICD-10-CM | POA: Diagnosis not present

## 2020-05-11 DIAGNOSIS — I10 Essential (primary) hypertension: Secondary | ICD-10-CM | POA: Diagnosis not present

## 2020-05-11 DIAGNOSIS — M47816 Spondylosis without myelopathy or radiculopathy, lumbar region: Secondary | ICD-10-CM | POA: Diagnosis not present

## 2020-05-13 DIAGNOSIS — Z23 Encounter for immunization: Secondary | ICD-10-CM | POA: Diagnosis not present

## 2020-05-15 ENCOUNTER — Ambulatory Visit (INDEPENDENT_AMBULATORY_CARE_PROVIDER_SITE_OTHER): Payer: Medicare Other

## 2020-05-15 DIAGNOSIS — R55 Syncope and collapse: Secondary | ICD-10-CM | POA: Diagnosis not present

## 2020-05-18 DIAGNOSIS — M5116 Intervertebral disc disorders with radiculopathy, lumbar region: Secondary | ICD-10-CM | POA: Diagnosis not present

## 2020-05-18 DIAGNOSIS — M5416 Radiculopathy, lumbar region: Secondary | ICD-10-CM | POA: Diagnosis not present

## 2020-05-19 ENCOUNTER — Telehealth: Payer: Self-pay

## 2020-05-19 NOTE — Telephone Encounter (Signed)
Carelink alert received for symptom episode occurring with ST.  Spoke with pt she reports feeling her Heart racing, she denies any other symptoms.  Pt does report that she had a steroid injection in her back yesterday.    She will continue to monitor and report if symptoms occur again.

## 2020-05-19 NOTE — Progress Notes (Signed)
Carelink Summary Report / Loop Recorder 

## 2020-05-29 DIAGNOSIS — H353211 Exudative age-related macular degeneration, right eye, with active choroidal neovascularization: Secondary | ICD-10-CM | POA: Diagnosis not present

## 2020-06-02 DIAGNOSIS — R338 Other retention of urine: Secondary | ICD-10-CM | POA: Diagnosis not present

## 2020-06-02 DIAGNOSIS — N952 Postmenopausal atrophic vaginitis: Secondary | ICD-10-CM | POA: Diagnosis not present

## 2020-06-05 ENCOUNTER — Other Ambulatory Visit: Payer: Self-pay | Admitting: Obstetrics & Gynecology

## 2020-06-05 NOTE — Telephone Encounter (Signed)
Medication refill request: Clobetasol Last OV:  10/22/18 Dr. Sabra Heck Next AEX: none  Last MMG (if hormonal medication request): 02/24/20 BIRADS 1 negative/density a Refill authorized: Today, please advise

## 2020-06-06 ENCOUNTER — Encounter: Payer: Self-pay | Admitting: Internal Medicine

## 2020-06-06 ENCOUNTER — Ambulatory Visit (INDEPENDENT_AMBULATORY_CARE_PROVIDER_SITE_OTHER): Payer: Medicare Other | Admitting: Internal Medicine

## 2020-06-06 ENCOUNTER — Other Ambulatory Visit: Payer: Self-pay

## 2020-06-06 VITALS — BP 114/60 | HR 71 | Ht 64.0 in | Wt 141.6 lb

## 2020-06-06 DIAGNOSIS — I10 Essential (primary) hypertension: Secondary | ICD-10-CM

## 2020-06-06 DIAGNOSIS — R55 Syncope and collapse: Secondary | ICD-10-CM

## 2020-06-06 NOTE — Patient Instructions (Signed)
Medication Instructions:  °Your physician recommends that you continue on your current medications as directed. Please refer to the Current Medication list given to you today. ° °Labwork: °None ordered. ° °Testing/Procedures: °None ordered. ° °Follow-Up: °Your physician wants you to follow-up in: one year with Dr. Taylor.   You will receive a reminder letter in the mail two months in advance. If you don't receive a letter, please call our office to schedule the follow-up appointment. ° °Remote monthly monitoring is used to monitor your loop recorder. ° °Any Other Special Instructions Will Be Listed Below (If Applicable). ° °If you need a refill on your cardiac medications before your next appointment, please call your pharmacy.  ° °

## 2020-06-06 NOTE — Progress Notes (Signed)
HPI Sabrina Parks returns today for followup of unexplained syncope. She is a pleasant 79 yo woman who underwent ILR insertion about 3 years ago. She has activated her device which demonstrates ST and PVC's. She has had a couple of episodes of NS SVT for which she was not symptomatic. She denies chest pain or sob. No syncope. She has also had pauses. Asymptomatic. Allergies  Allergen Reactions  . Codeine Nausea Only  . Morphine And Related Hives  . Sulfa Antibiotics Rash     Current Outpatient Medications  Medication Sig Dispense Refill  . acetaminophen (TYLENOL) 500 MG tablet Take 500 mg by mouth every 6 (six) hours as needed for moderate pain or headache.    Marland Kitchen amitriptyline (ELAVIL) 25 MG tablet Take 25 mg by mouth at bedtime.     Marland Kitchen aspirin EC 81 MG tablet Take 81 mg by mouth at bedtime.    . clobetasol ointment (TEMOVATE) 6.78 % Apply 1 application topically 2 (two) times daily. Apply as directed twice daily.  Use for up to 7 days. 60 g 0  . famotidine (PEPCID) 10 MG tablet Take 10 mg by mouth daily.    Marland Kitchen ibuprofen (ADVIL,MOTRIN) 200 MG tablet Take 200 mg by mouth every 8 (eight) hours as needed for headache or moderate pain.     Marland Kitchen levothyroxine (SYNTHROID, LEVOTHROID) 50 MCG tablet Take 50 mcg by mouth daily before breakfast.    . LORazepam (ATIVAN) 0.5 MG tablet Take 0.5 mg by mouth daily as needed for anxiety.     Marland Kitchen olmesartan (BENICAR) 40 MG tablet Take 40 mg by mouth at bedtime.     . ondansetron (ZOFRAN) 4 MG tablet Take 4 mg by mouth every 6 (six) hours as needed for nausea.   1  . PREDNISONE PO Take by mouth. Dose pak    . PROAIR HFA 108 (90 BASE) MCG/ACT inhaler Inhale 2 puffs into the lungs every 6 (six) hours as needed for wheezing or shortness of breath.     . rosuvastatin (CRESTOR) 20 MG tablet Take 1 tablet (20 mg total) by mouth daily. 90 tablet 3  . triamcinolone (NASACORT ALLERGY 24HR) 55 MCG/ACT AERO nasal inhaler Place 1 spray into the nose daily as needed  (for allergies).    . triamterene-hydrochlorothiazide (MAXZIDE-25) 37.5-25 MG tablet Take 0.5 tablets by mouth daily. 45 tablet 3   No current facility-administered medications for this visit.     Past Medical History:  Diagnosis Date  . Alopecia   . Arthritis   . Complication of anesthesia   . DJD (degenerative joint disease), cervical 5/02   [facility]lsner  . HSV-1 infection   . Hyperlipidemia   . Hypertension   . Lichen sclerosus 93/81/0175   biopsy proven  . Liver hemangioma 2011   Dr. Earlean Shawl  . PONV (postoperative nausea and vomiting)   . Thyroiditis   . Transient global amnesia    not fully diag    ROS:   All systems reviewed and negative except as noted in the HPI.   Past Surgical History:  Procedure Laterality Date  . APPENDECTOMY  1956  . CARDIOVASCULAR STRESS TEST  10/29/2006   EF 78%.   Lester [place] REPAIR N/A 12/29/2018   Procedure: exam under anesthesia;  Surgeon: Bjorn Loser, MD;  Location: [facility];  Service: Urology;  Laterality: N/A;  . LOOP RECORDER INSERTION N/A 05/01/2017   Procedure: LOOP RECORDER INSERTION;  Surgeon: Evans Lance, MD;  Location: [facility];  Service: Cardiovascular;  Laterality: N/A;  . ROBOTIC ASSISTED LAPAROSCOPIC SACROCOLPOPEXY N/A 02/25/2019   Procedure: XI ROBOTIC ASSISTED LAPAROSCOPIC SACROCOLPOPEXY;  Surgeon: [name], MD;  Location: [facility];  Service: Urology;  Laterality: N/A;  . ROTATOR CUFF REPAIR Bilateral   . SHOULDER SURGERY    . TONSILLECTOMY AND ADENOIDECTOMY  1957  . TOTAL ABDOMINAL HYSTERECTOMY W/ BILATERAL SALPINGOOPHORECTOMY  7/90  . US ECHOCARDIOGRAPHY  10/11/2009   EF 55-60%     Family History  Problem Relation Age of Onset  . Stroke Mother   . Cancer Father   . Colon cancer Father        38's     Social History   Socioeconomic History  . Marital status: Married    Spouse name: Not on file  . Number of children: Not on file  . Years of education: Not on file  . Highest education  level: Not on file  Occupational History  . Not on file  Tobacco Use  . Smoking status: Former Smoker    Years: 10.00    Types: Cigarettes    Quit date: 01/01/1970    Years since quitting: 50.4  . Smokeless tobacco: Never Used  Vaping Use  . Vaping Use: Never used  Substance and Sexual Activity  . Alcohol use: Yes    Comment: 1-2 glasses of wine   . Drug use: No  . Sexual activity: Yes    Partners: Male    Comment: hysterectomy  Other Topics Concern  . Not on file  Social History Narrative  . Not on file   Social Determinants of Health   Financial Resource Strain:   . Difficulty of Paying Living Expenses: Not on file  Food Insecurity:   . Worried About Charity fundraiser in the Last Year: Not on file  . Ran Out of Food in the Last Year: Not on file  Transportation Needs:   . Lack of Transportation (Medical): Not on file  . Lack of Transportation (Non-Medical): Not on file  Physical Activity:   . Days of Exercise per Week: Not on file  . Minutes of Exercise per Session: Not on file  Stress:   . Feeling of Stress : Not on file  Social Connections:   . Frequency of Communication with Friends and Family: Not on file  . Frequency of Social Gatherings with Friends and Family: Not on file  . Attends Religious Services: Not on file  . Active Member of Clubs or Organizations: Not on file  . Attends Archivist Meetings: Not on file  . Marital Status: Not on file  Intimate Partner Violence:   . Fear of Current or Ex-Partner: Not on file  . Emotionally Abused: Not on file  . Physically Abused: Not on file  . Sexually Abused: Not on file     BP 114/60   Pulse 71   Ht 5\' 4"  (1.626 m)   Wt 141 lb 9.6 oz (64.2 kg)   LMP 07/29/1990   SpO2 92%   BMI 24.31 kg/m   Physical Exam:  Well appearing NAD HEENT: Unremarkable Neck:  No JVD, no thyromegally Lymphatics:  No adenopathy Back:  No CVA tenderness Lungs:  Clear with no wheezes HEART:  Regular rate rhythm,  no murmurs, no rubs, no clicks Abd:  soft, positive bowel sounds, no organomegally, no rebound, no guarding Ext:  2 plus pulses, no edema, no cyanosis, no clubbing Skin:  No rashes no nodules Neuro:  CN II through XII intact, motor grossly intact  DEVICE  Normal device function.  See PaceArt for details.   Assess/Plan: 1. Unexplained syncope - her device has been in place for 3 years. She will undego watchful waiting. 2. Pauses  - Her ILR demonstrates asymptomatic pauses. We will follow. 3. PVC's - she is minimally symptomatic. She will continue her current meds.  Carleene Overlie Allisyn Kunz,MD

## 2020-06-10 ENCOUNTER — Other Ambulatory Visit: Payer: Self-pay | Admitting: Internal Medicine

## 2020-06-12 ENCOUNTER — Telehealth: Payer: Self-pay

## 2020-06-12 ENCOUNTER — Ambulatory Visit (INDEPENDENT_AMBULATORY_CARE_PROVIDER_SITE_OTHER): Payer: Medicare Other

## 2020-06-12 DIAGNOSIS — R55 Syncope and collapse: Secondary | ICD-10-CM | POA: Diagnosis not present

## 2020-06-12 LAB — CUP PACEART REMOTE DEVICE CHECK
Date Time Interrogation Session: 20211115012406
Implantable Pulse Generator Implant Date: 20181004

## 2020-06-12 NOTE — Telephone Encounter (Signed)
ILR symptoms event notification 06/11/20. Event appeared SR rate 90-100 bpm.   Called patient to assess. No answer, LMOVM.

## 2020-06-13 NOTE — Progress Notes (Signed)
Carelink Summary Report / Loop Recorder 

## 2020-06-15 NOTE — Telephone Encounter (Addendum)
Patient reports at time of episode she was out to dinner and began to feel dizzy. She was able to sit down in the chair. No CP, SOB, chest pressure, or syncope. Symptoms resolved after an hour. ED precautions given.

## 2020-06-16 NOTE — Telephone Encounter (Signed)
No arrhythmias to explain her episode. No change in treatment.

## 2020-06-27 DIAGNOSIS — H04123 Dry eye syndrome of bilateral lacrimal glands: Secondary | ICD-10-CM | POA: Diagnosis not present

## 2020-06-27 DIAGNOSIS — H52201 Unspecified astigmatism, right eye: Secondary | ICD-10-CM | POA: Diagnosis not present

## 2020-06-27 DIAGNOSIS — H5202 Hypermetropia, left eye: Secondary | ICD-10-CM | POA: Diagnosis not present

## 2020-06-27 DIAGNOSIS — H26492 Other secondary cataract, left eye: Secondary | ICD-10-CM | POA: Diagnosis not present

## 2020-07-03 DIAGNOSIS — H353211 Exudative age-related macular degeneration, right eye, with active choroidal neovascularization: Secondary | ICD-10-CM | POA: Diagnosis not present

## 2020-07-03 DIAGNOSIS — H35363 Drusen (degenerative) of macula, bilateral: Secondary | ICD-10-CM | POA: Diagnosis not present

## 2020-07-03 DIAGNOSIS — H35372 Puckering of macula, left eye: Secondary | ICD-10-CM | POA: Diagnosis not present

## 2020-07-03 DIAGNOSIS — H353122 Nonexudative age-related macular degeneration, left eye, intermediate dry stage: Secondary | ICD-10-CM | POA: Diagnosis not present

## 2020-07-03 DIAGNOSIS — Z961 Presence of intraocular lens: Secondary | ICD-10-CM | POA: Diagnosis not present

## 2020-07-03 DIAGNOSIS — H35453 Secondary pigmentary degeneration, bilateral: Secondary | ICD-10-CM | POA: Diagnosis not present

## 2020-07-10 DIAGNOSIS — L814 Other melanin hyperpigmentation: Secondary | ICD-10-CM | POA: Diagnosis not present

## 2020-07-10 DIAGNOSIS — L570 Actinic keratosis: Secondary | ICD-10-CM | POA: Diagnosis not present

## 2020-07-10 DIAGNOSIS — D0461 Carcinoma in situ of skin of right upper limb, including shoulder: Secondary | ICD-10-CM | POA: Diagnosis not present

## 2020-07-10 DIAGNOSIS — Z85828 Personal history of other malignant neoplasm of skin: Secondary | ICD-10-CM | POA: Diagnosis not present

## 2020-07-13 ENCOUNTER — Telehealth: Payer: Self-pay

## 2020-07-13 ENCOUNTER — Other Ambulatory Visit: Payer: Self-pay | Admitting: Neurological Surgery

## 2020-07-13 DIAGNOSIS — M47816 Spondylosis without myelopathy or radiculopathy, lumbar region: Secondary | ICD-10-CM

## 2020-07-13 NOTE — Telephone Encounter (Signed)
Call to patient. Patient advised would need an appointment prior to refill of clobetasol (see refill request dated 06-05-20). Patient unaware that Dr. Sabra Heck no longer at [facility]. Patient states she would like to schedule at her new facility. Telephone number provided for patient to [facility] and patient will call to schedule appointment. Patient will send refill request through MyChart to Dr. Sabra Heck. Patient appreciative of phone call.   Routing to provider and will close encounter.

## 2020-07-13 NOTE — Telephone Encounter (Signed)
Patient is calling for a refill of Temovate. Patient would also like a call back to change her pharmacy.

## 2020-07-14 ENCOUNTER — Ambulatory Visit (INDEPENDENT_AMBULATORY_CARE_PROVIDER_SITE_OTHER): Payer: Medicare Other

## 2020-07-14 DIAGNOSIS — R55 Syncope and collapse: Secondary | ICD-10-CM

## 2020-07-16 LAB — CUP PACEART REMOTE DEVICE CHECK
Date Time Interrogation Session: 20211218012434
Implantable Pulse Generator Implant Date: 20181004

## 2020-07-27 NOTE — Progress Notes (Signed)
Carelink Summary Report / Loop Recorder 

## 2020-08-07 DIAGNOSIS — J029 Acute pharyngitis, unspecified: Secondary | ICD-10-CM | POA: Diagnosis not present

## 2020-08-07 DIAGNOSIS — Z1152 Encounter for screening for COVID-19: Secondary | ICD-10-CM | POA: Diagnosis not present

## 2020-08-07 DIAGNOSIS — R319 Hematuria, unspecified: Secondary | ICD-10-CM | POA: Diagnosis not present

## 2020-08-07 DIAGNOSIS — N390 Urinary tract infection, site not specified: Secondary | ICD-10-CM | POA: Diagnosis not present

## 2020-08-07 DIAGNOSIS — R102 Pelvic and perineal pain: Secondary | ICD-10-CM | POA: Diagnosis not present

## 2020-08-09 ENCOUNTER — Ambulatory Visit
Admission: RE | Admit: 2020-08-09 | Discharge: 2020-08-09 | Disposition: A | Discharge status: Home / Self Care | Payer: Medicare Other | Source: Ambulatory Visit | Attending: Neurological Surgery | Admitting: Neurological Surgery

## 2020-08-09 DIAGNOSIS — M47816 Spondylosis without myelopathy or radiculopathy, lumbar region: Secondary | ICD-10-CM

## 2020-08-11 DIAGNOSIS — N3000 Acute cystitis without hematuria: Secondary | ICD-10-CM | POA: Diagnosis not present

## 2020-08-11 DIAGNOSIS — M48062 Spinal stenosis, lumbar region with neurogenic claudication: Secondary | ICD-10-CM | POA: Diagnosis not present

## 2020-08-11 DIAGNOSIS — R338 Other retention of urine: Secondary | ICD-10-CM | POA: Diagnosis not present

## 2020-08-15 ENCOUNTER — Other Ambulatory Visit: Payer: Self-pay | Admitting: Neurological Surgery

## 2020-08-15 ENCOUNTER — Ambulatory Visit (INDEPENDENT_AMBULATORY_CARE_PROVIDER_SITE_OTHER): Payer: Medicare Other

## 2020-08-15 DIAGNOSIS — R55 Syncope and collapse: Secondary | ICD-10-CM

## 2020-08-17 ENCOUNTER — Other Ambulatory Visit: Payer: Self-pay

## 2020-08-17 LAB — CUP PACEART REMOTE DEVICE CHECK
Date Time Interrogation Session: 20220120014306
Implantable Pulse Generator Implant Date: 20181004

## 2020-08-17 MED ORDER — TRIAMTERENE-HCTZ 37.5-25 MG PO TABS: 0.5000 | ORAL_TABLET | Freq: Every day | ORAL | 2 refills | Status: DC

## 2020-08-21 DIAGNOSIS — H353211 Exudative age-related macular degeneration, right eye, with active choroidal neovascularization: Secondary | ICD-10-CM | POA: Diagnosis not present

## 2020-08-24 DIAGNOSIS — H5319 Other subjective visual disturbances: Secondary | ICD-10-CM | POA: Diagnosis not present

## 2020-08-24 DIAGNOSIS — H35371 Puckering of macula, right eye: Secondary | ICD-10-CM | POA: Diagnosis not present

## 2020-08-24 DIAGNOSIS — H35361 Drusen (degenerative) of macula, right eye: Secondary | ICD-10-CM | POA: Diagnosis not present

## 2020-08-24 DIAGNOSIS — H353211 Exudative age-related macular degeneration, right eye, with active choroidal neovascularization: Secondary | ICD-10-CM | POA: Diagnosis not present

## 2020-08-24 DIAGNOSIS — H43391 Other vitreous opacities, right eye: Secondary | ICD-10-CM | POA: Diagnosis not present

## 2020-08-24 DIAGNOSIS — H35431 Paving stone degeneration of retina, right eye: Secondary | ICD-10-CM | POA: Diagnosis not present

## 2020-08-29 ENCOUNTER — Other Ambulatory Visit: Payer: Self-pay | Admitting: General Practice

## 2020-08-29 NOTE — Progress Notes (Signed)
Carelink Summary Report / Loop Recorder 

## 2020-08-29 NOTE — Telephone Encounter (Signed)
Patient called and left message on nurse voicemail line stating she is a patient of Dr Ammie Ferrier from the other office and she needs a refill on her clobetasol sent to her [employer]. Will route to Dr Sabra Heck. Called & informed patient.

## 2020-08-31 MED ORDER — CLOBETASOL PROPIONATE 0.05 % EX OINT: 1.0000 "application " | TOPICAL_OINTMENT | Freq: Two times a day (BID) | CUTANEOUS | 1 refills | Status: DC

## 2020-08-31 NOTE — Telephone Encounter (Signed)
Rx for clobetasol has been filled.  Pt hasn't been seen in two years and does need an appt.  Not urgent.  Thanks.

## 2020-09-18 ENCOUNTER — Ambulatory Visit (INDEPENDENT_AMBULATORY_CARE_PROVIDER_SITE_OTHER): Payer: Medicare Other

## 2020-09-18 DIAGNOSIS — R55 Syncope and collapse: Secondary | ICD-10-CM | POA: Diagnosis not present

## 2020-09-18 NOTE — Pre-Procedure Instructions (Signed)
Sabrina Parks  09/18/2020    Your procedure is scheduled on Mon., Feb. 28, 2022 from 7:30AM-10:07AM  [facility] 5:30AM  Call this number if you have problems the morning of surgery:  [phone]   Remember:  Do not eat or drink after midnight on Feb. 27th    Take these medicines the morning of surgery with A SIP OF WATER: Levothyroxine (SYNTHROID, LEVOTHROID) Rosuvastatin (CRESTOR)  If Needed: Acetaminophen (TYLENOL)  Famotidine (PEPCID) LORazepam (ATIVAN) Triamcinolone (NASACORT)  PROAIR HFA Inhaler- bring with you day of surgery  As of today, STOP taking all Aspirin (unless instructed by your doctor) and Other Aspirin containing products, Vitamins, Fish oils, and Herbal medications. Also stop all NSAIDS i.e. Advil, Ibuprofen, Motrin, Aleve, Anaprox, Naproxen, BC, Goody Powders, and all Supplements.   No Smoking of any kind, Tobacco/Vaping, or Alcohol products 24 hours prior to your procedure. If you use a Cpap at night, you may bring all equipment for your overnight stay.    Day of Surgery:  Do not wear jewelry, make-up or nail polish.  Do not wear lotions, powders, or perfumes, or deodorant.  Do not shave 48 hours prior to surgery.    Do not bring valuables to the hospital.  [facility] is not responsible for any belongings or valuables.  Contacts, dentures or bridgework may not be worn into surgery.   For patients admitted to the hospital, discharge time will be determined by your treatment team.  Patients discharged the day of surgery will not be allowed to drive home, and someone age 79 and over needs to stay with them for 24 hours.    Special instructions:   [facility]- Preparing For Surgery  Before surgery, you can play an important role. Because skin is not sterile, your skin needs to be as free of germs as possible. You can reduce the number of germs on your skin by washing with CHG (chlorahexidine gluconate) Soap  before surgery.  CHG is an antiseptic cleaner which kills germs and bonds with the skin to continue killing germs even after washing.    Oral Hygiene is also important to reduce your risk of infection.  Remember - BRUSH YOUR TEETH THE MORNING OF SURGERY WITH YOUR REGULAR TOOTHPASTE  Please do not use if you have an allergy to CHG or antibacterial soaps. If your skin becomes reddened/irritated stop using the CHG.  Do not shave (including legs and underarms) for at least 48 hours prior to first CHG shower. It is OK to shave your face.  Please follow these instructions carefully.   1. Shower the NIGHT BEFORE SURGERY and the MORNING OF SURGERY with CHG.   2. If you chose to wash your hair, wash your hair first as usual with your normal shampoo.  3. After you shampoo, rinse your hair and body thoroughly to remove the shampoo.  4. Use CHG as you would any other liquid soap. You can apply CHG directly to the skin and wash gently with a scrungie or a clean washcloth.   5. Apply the CHG Soap to your body ONLY FROM THE NECK DOWN.  Do not use on open wounds or open sores. Avoid contact with your eyes, ears, mouth and genitals (private parts). Wash Face and genitals (private parts)  with your normal soap.  6. Wash thoroughly, paying special attention to the area where your surgery will be performed.  7. Thoroughly rinse your body with warm water from the neck  down.  8. DO NOT shower/wash with your normal soap after using and rinsing off the CHG Soap.  9. Pat yourself dry with a CLEAN TOWEL.  10. Wear CLEAN PAJAMAS to bed the night before surgery, wear comfortable clothes the morning of surgery  11. Place CLEAN SHEETS on your bed the night of your first shower and DO NOT SLEEP WITH PETS.  Reminders: Do not apply any deodorants/lotions.  Please wear clean clothes to the hospital/surgery center.   Remember to brush your teeth WITH YOUR REGULAR TOOTHPASTE.  Please read over the following fact  sheets that you were given.

## 2020-09-19 ENCOUNTER — Inpatient Hospital Stay (HOSPITAL_COMMUNITY)
Admission: RE | Admit: 2020-09-19 | Discharge: 2020-09-19 | Disposition: A | Discharge status: Home / Self Care | Payer: Medicare Other | Source: Ambulatory Visit

## 2020-09-19 LAB — CUP PACEART REMOTE DEVICE CHECK
Date Time Interrogation Session: 20220222015411
Implantable Pulse Generator Implant Date: 20181004

## 2020-09-21 ENCOUNTER — Encounter (HOSPITAL_COMMUNITY): Payer: Self-pay

## 2020-09-21 ENCOUNTER — Encounter (HOSPITAL_COMMUNITY)
Admission: RE | Admit: 2020-09-21 | Discharge: 2020-09-21 | Disposition: A | Discharge status: Home / Self Care | Payer: Medicare Other | Source: Ambulatory Visit | Attending: Neurological Surgery | Admitting: Neurological Surgery

## 2020-09-21 ENCOUNTER — Other Ambulatory Visit: Payer: Self-pay

## 2020-09-21 DIAGNOSIS — Z01812 Encounter for preprocedural laboratory examination: Secondary | ICD-10-CM | POA: Insufficient documentation

## 2020-09-21 HISTORY — DX: Hypothyroidism, unspecified: E03.9

## 2020-09-21 HISTORY — DX: Headache, unspecified: R51.9

## 2020-09-21 HISTORY — DX: Gastro-esophageal reflux disease without esophagitis: K21.9

## 2020-09-21 HISTORY — DX: Raynaud's syndrome without gangrene: I73.00

## 2020-09-21 LAB — BASIC METABOLIC PANEL
Anion gap: 11 (ref 5–15)
BUN: 16 mg/dL (ref 8–23)
CO2: 23 mmol/L (ref 22–32)
Calcium: 10.1 mg/dL (ref 8.9–10.3)
Chloride: 101 mmol/L (ref 98–111)
Creatinine, Ser: 0.7 mg/dL (ref 0.44–1.00)
GFR, Estimated: >60 mL/min (ref >60)
Glucose, Bld: 109 mg/dL — ABNORMAL HIGH (ref 70–99)
Potassium: 3.7 mmol/L (ref 3.5–5.1)
Sodium: 135 mmol/L (ref 135–145)

## 2020-09-21 LAB — CBC
HCT: 40 % (ref 36.0–46.0)
Hemoglobin: 13.2 g/dL (ref 12.0–15.0)
MCH: 29.8 pg (ref 26.0–34.0)
MCHC: 33 g/dL (ref 30.0–36.0)
MCV: 90.3 fL (ref 80.0–100.0)
Platelets: 376 10*3/uL (ref 150–400)
RBC: 4.43 MIL/uL (ref 3.87–5.11)
RDW: 12.9 % (ref 11.5–15.5)
WBC: 11.4 10*3/uL — ABNORMAL HIGH (ref 4.0–10.5)
nRBC: 0 % (ref 0.0–0.2)

## 2020-09-21 LAB — SURGICAL PCR SCREEN
MRSA, PCR: NEGATIVE
Staphylococcus aureus: NEGATIVE

## 2020-09-21 NOTE — Anesthesia Preprocedure Evaluation (Addendum)
Anesthesia Evaluation  Patient identified by MRN, date of birth, ID band Patient awake    History of Anesthesia Complications (+) PONV  Airway Mallampati: II  TM Distance: >3 FB     Dental   Pulmonary former smoker,    breath sounds clear to auscultation       Cardiovascular hypertension, + dysrhythmias  Rhythm:Regular Rate:Normal     Neuro/Psych    GI/Hepatic Neg liver ROS, GERD  ,  Endo/Other  Hypothyroidism   Renal/GU negative Renal ROS     Musculoskeletal   Abdominal   Peds  Hematology   Anesthesia Other Findings   Reproductive/Obstetrics                           Anesthesia Physical Anesthesia Plan  ASA: III  Anesthesia Plan: General   Post-op Pain Management:    Induction: Intravenous  PONV Risk Score and Plan: 4 or greater and Ondansetron, Dexamethasone and Midazolam  Airway Management Planned: Oral ETT  Additional Equipment:   Intra-op Plan:   Post-operative Plan:   Informed Consent: I have reviewed the patients History and Physical, chart, labs and discussed the procedure including the risks, benefits and alternatives for the proposed anesthesia with the patient or authorized representative who has indicated his/her understanding and acceptance.     Dental advisory given  Plan Discussed with: Anesthesiologist and CRNA  Anesthesia Plan Comments: (PAT note written 09/21/2020 by Myra Gianotti, PA-C. )      Anesthesia Quick Evaluation

## 2020-09-21 NOTE — Progress Notes (Signed)
PCP - Marton Redwood Cardiologist - Dorris Carnes  PPM/ICD - loop recorder   Chest x-ray - n/a EKG - 03/27/20 Stress Test - 10/29/16 ECHO - 06/23/12 Cardiac Cath -   Sleep Study - no   Patient instructed to hold all Aspirin, NSAID's, herbal medications, fish oil and vitamins 7 days prior to surgery.   ERAS Protcol -no   COVID TEST- 09/22/20   Anesthesia review: yes, history of loop recorder  Patient denies shortness of breath, fever, cough and chest pain at PAT appointment   All instructions explained to the patient, with a verbal understanding of the material. Patient agrees to go over the instructions while at home for a better understanding. Patient also instructed to self quarantine after being tested for COVID-19. The opportunity to ask questions was provided.

## 2020-09-21 NOTE — Progress Notes (Signed)
Anesthesia Chart Review:  Case: [id number] Date/Time: 09/25/20 0715   Procedure: Lumbar 2-3 Lumbar 3-4 Lumbar 4-5 Laminectomy and foraminotomy (N/A Back) - 3C/RM 21   Anesthesia type: General   Pre-op diagnosis: Lumbar stenosis with neurogenic claudication   Location: [facility] OR ROOM 21 / [facility]   Surgeons: Kristeen Miss, MD      DISCUSSION: Patient is a 80 year old female scheduled for the above procedure.  History includes former smoker (quit 01/01/70), post-oprative N/V, HTN, HLD, LBBB, loop recorder (placed 05/01/17 for unexplained syncope), GERD, Raynaud's disease, thyroiditis-->hypothyroidism, alopecia, liver hemangioma (2011, Dr. Earlean Shawl), transient global amnesia (single episode 2013), pelvic organ prolapse (s/p robotic assisted laparoscopic sacrocolpopexy 02/25/19).   She is followed by cardiologists Dr. Harrington Challenger and Dr. Lovena Le and is current with follow-up (see PROVIDERS below). Normal ILD function with last interrogation with no new arrhythmias. Known LBBB. Low risk stress test in 2018.  She denied shortness of breath and chest pain at PAT RN visit.  Loop recorder interrogation 09/19/20: ILR summary report received. Battery status OK. Normal device function. No new tachy, brady, or pause episodes. No new AF episodes. 1 symptom event w/ ECG SR 80's bpm. Monthly summary reports and ROV/PRN. HB  Preoperative COVID-19 test is scheduled for 09/22/2020. Anesthesia team to evaluate on the day of surgery.    VS: BP (!) 162/61   Pulse 93   Temp 36.4 C (Oral)   Resp 18   Ht 5\' 3"  (1.6 m)   Wt 63.2 kg   LMP 07/29/1990   SpO2 99%   BMI 24.68 kg/m     PROVIDERS: Marton Redwood, MD is PCP  - Dorris Carnes, MD is cardiologist. Last visit 03/27/20 for HTN follow-up. Fairly active at that time, going to water aerobics and doing some walking. Occasional palpitations, but not syncope or severe dizziness. Follow-up in ~ 9 months planned. Cristopher Peru, MD is EP cardiologist. Last visit 06/06/20 for loop  recorder follow-up. He notes that over the past 3 years a couple of non-sustained SVT and occasional PVCs noted on ILD interrogation. She also had a single 3 second asymptomatic pause on 04/21/20. Continued watchful waiting recommended. One year follow-up recommended.    LABS: Labs reviewed: Acceptable for surgery. (all labs ordered are listed, but only abnormal results are displayed)  Labs Reviewed  BASIC METABOLIC PANEL - Abnormal; Notable for the following components:      Result Value   Glucose, Bld 109 (*)    All other components within normal limits  CBC - Abnormal; Notable for the following components:   WBC 11.4 (*)    All other components within normal limits  SURGICAL PCR SCREEN   PFTs > 5 years ago.   IMAGES: MRI L-spine 08/09/20: IMPRESSION: - Progressive disc degeneration since 2016.  Negative for fracture. - Moderate spinal stenosis L2-3 and L3-4 - Severe spinal stenosis L4-5 with severe subarticular and foraminal stenosis bilaterally left greater than right. Broad-based disc protrusion at L4-5.   EKG: 03/27/20 ([facility]): SR. LBBB. - Known LBBB.   CV: EP Procedure 05/01/17 CONCLUSIONS:  1. Successful implantation of a [employer] Reveal LINQ implantable loop recorder for unexplained syncope.   Myocardial Perfusion 10/29/16  Nuclear stress EF: 68%.  There was no ST segment deviation noted during stress.  Defect 1: There is a small defect of moderate severity present in the apical septal location. Low risk stress nuclear study with a fixed apicoseptal defect that appears most likely to be an artifact. Normal left ventricular regional  and global systolic function.   Echo 06/23/12 Impressions:  - Normal LV size with mild LV hypertrophy. Vigorous systolic  function with EF 65-70%. Normal RV size and systolic  function. No significant valvular abnormalities.    Carotid Doppler 06/13/12 Summary: No significant extracranial carotid artery  stenosis demonstrated. Vertebrals are patent with antegrade flow.   Past Medical History:  Diagnosis Date  . Alopecia   . Arthritis   . Complication of anesthesia   . DJD (degenerative joint disease), cervical 5/02   [facility]  . GERD (gastroesophageal reflux disease)   . Headache   . HSV-1 infection   . Hyperlipidemia   . Hypertension   . Hypothyroidism   . Lichen sclerosus 14/48/1856   biopsy proven  . Liver hemangioma 2011   Dr. Earlean Shawl  . PONV (postoperative nausea and vomiting)   . Raynaud's disease   . Thyroiditis   . Transient global amnesia    not fully diag    Past Surgical History:  Procedure Laterality Date  . APPENDECTOMY  1956  . CARDIOVASCULAR STRESS TEST  10/29/2006   EF 78%.   Marland Kitchen CATARACT EXTRACTION Bilateral   . CYSTOCELE REPAIR N/A 12/29/2018   Procedure: exam under anesthesia;  Surgeon: Bjorn Loser, MD;  Location: [facility];  Service: Urology;  Laterality: N/A;  . LOOP RECORDER INSERTION N/A 05/01/2017   Procedure: LOOP RECORDER INSERTION;  Surgeon: Evans Lance, MD;  Location: [facility];  Service: Cardiovascular;  Laterality: N/A;  . ROBOTIC ASSISTED LAPAROSCOPIC SACROCOLPOPEXY N/A 02/25/2019   Procedure: XI ROBOTIC ASSISTED LAPAROSCOPIC SACROCOLPOPEXY;  Surgeon: [name], MD;  Location: [facility];  Service: Urology;  Laterality: N/A;  . ROTATOR CUFF REPAIR Bilateral   . SHOULDER SURGERY    . TONSILLECTOMY AND ADENOIDECTOMY  1957  . TOTAL ABDOMINAL HYSTERECTOMY W/ BILATERAL SALPINGOOPHORECTOMY  7/90  . US ECHOCARDIOGRAPHY  10/11/2009   EF 55-60%    MEDICATIONS: . acetaminophen (TYLENOL) 500 MG tablet  . amitriptyline (ELAVIL) 25 MG tablet  . aspirin EC 81 MG tablet  . clobetasol ointment (TEMOVATE) 0.05 %  . famotidine (PEPCID) 10 MG tablet  . ibuprofen (ADVIL,MOTRIN) 200 MG tablet  . levothyroxine (SYNTHROID, LEVOTHROID) 50 MCG tablet  . LORazepam (ATIVAN) 0.5 MG tablet  . olmesartan (BENICAR) 40 MG tablet  . PROAIR HFA 108  (90 BASE) MCG/ACT inhaler  . rosuvastatin (CRESTOR) 20 MG tablet  . triamcinolone (NASACORT) 55 MCG/ACT AERO nasal inhaler  . triamterene-hydrochlorothiazide (MAXZIDE-25) 37.5-25 MG tablet   No current facility-administered medications for this encounter.    Myra Gianotti, PA-C Surgical Short Stay/Anesthesiology [facility] Phone [phone] [facility] Phone [phone] 09/21/2020 6:22 PM

## 2020-09-22 ENCOUNTER — Other Ambulatory Visit (HOSPITAL_COMMUNITY)
Admission: RE | Admit: 2020-09-22 | Discharge: 2020-09-22 | Disposition: A | Discharge status: Home / Self Care | Payer: Medicare Other | Source: Ambulatory Visit | Attending: Neurological Surgery | Admitting: Neurological Surgery

## 2020-09-22 DIAGNOSIS — Z01812 Encounter for preprocedural laboratory examination: Secondary | ICD-10-CM | POA: Insufficient documentation

## 2020-09-22 DIAGNOSIS — Z20822 Contact with and (suspected) exposure to covid-19: Secondary | ICD-10-CM | POA: Diagnosis not present

## 2020-09-22 LAB — SARS CORONAVIRUS 2 (TAT 6-24 HRS): SARS Coronavirus 2: NEGATIVE

## 2020-09-25 ENCOUNTER — Encounter (HOSPITAL_COMMUNITY): Payer: Self-pay | Admitting: Neurological Surgery

## 2020-09-25 ENCOUNTER — Ambulatory Visit (HOSPITAL_COMMUNITY): Payer: Medicare Other | Admitting: Vascular Surgery

## 2020-09-25 ENCOUNTER — Observation Stay (HOSPITAL_COMMUNITY)
Admission: RE | Admit: 2020-09-25 | Discharge: 2020-09-26 | Disposition: A | Discharge status: Home / Self Care | Payer: Medicare Other | Attending: Neurological Surgery | Admitting: Neurological Surgery

## 2020-09-25 ENCOUNTER — Other Ambulatory Visit: Payer: Self-pay

## 2020-09-25 ENCOUNTER — Ambulatory Visit (HOSPITAL_COMMUNITY): Payer: Medicare Other | Admitting: Anesthesiology

## 2020-09-25 ENCOUNTER — Ambulatory Visit (HOSPITAL_COMMUNITY): Payer: Medicare Other

## 2020-09-25 ENCOUNTER — Encounter (HOSPITAL_COMMUNITY)
Admission: RE | Disposition: A | Discharge status: Home / Self Care | Payer: Self-pay | Source: Home / Self Care | Attending: Neurological Surgery

## 2020-09-25 DIAGNOSIS — M48062 Spinal stenosis, lumbar region with neurogenic claudication: Secondary | ICD-10-CM | POA: Diagnosis not present

## 2020-09-25 DIAGNOSIS — E039 Hypothyroidism, unspecified: Secondary | ICD-10-CM | POA: Insufficient documentation

## 2020-09-25 DIAGNOSIS — M4727 Other spondylosis with radiculopathy, lumbosacral region: Secondary | ICD-10-CM | POA: Diagnosis not present

## 2020-09-25 DIAGNOSIS — M48061 Spinal stenosis, lumbar region without neurogenic claudication: Secondary | ICD-10-CM | POA: Diagnosis not present

## 2020-09-25 DIAGNOSIS — Z87891 Personal history of nicotine dependence: Secondary | ICD-10-CM | POA: Diagnosis not present

## 2020-09-25 DIAGNOSIS — Z7982 Long term (current) use of aspirin: Secondary | ICD-10-CM | POA: Insufficient documentation

## 2020-09-25 DIAGNOSIS — M4186 Other forms of scoliosis, lumbar region: Secondary | ICD-10-CM | POA: Diagnosis not present

## 2020-09-25 DIAGNOSIS — Z419 Encounter for procedure for purposes other than remedying health state, unspecified: Secondary | ICD-10-CM

## 2020-09-25 DIAGNOSIS — Z79899 Other long term (current) drug therapy: Secondary | ICD-10-CM | POA: Diagnosis not present

## 2020-09-25 DIAGNOSIS — I10 Essential (primary) hypertension: Secondary | ICD-10-CM | POA: Diagnosis not present

## 2020-09-25 DIAGNOSIS — M5416 Radiculopathy, lumbar region: Secondary | ICD-10-CM | POA: Diagnosis not present

## 2020-09-25 DIAGNOSIS — I447 Left bundle-branch block, unspecified: Secondary | ICD-10-CM | POA: Diagnosis not present

## 2020-09-25 HISTORY — PX: LUMBAR LAMINECTOMY/DECOMPRESSION MICRODISCECTOMY: SHX5026

## 2020-09-25 SURGERY — LUMBAR LAMINECTOMY/DECOMPRESSION MICRODISCECTOMY 3 LEVELS
Anesthesia: General | Site: Back

## 2020-09-25 MED ORDER — HYDROMORPHONE HCL 1 MG/ML IJ SOLN: 0.5000 mg | INTRAMUSCULAR | Status: DC | PRN

## 2020-09-25 MED ORDER — METHOCARBAMOL 500 MG PO TABS
500.0000 mg | ORAL_TABLET | Freq: Four times a day (QID) | ORAL | Status: DC | PRN
  Administered 2020-09-25 – 2020-09-26 (×2): 500 mg via ORAL
  Filled 2020-09-25 (×2): qty 1

## 2020-09-25 MED ORDER — ROCURONIUM BROMIDE 10 MG/ML (PF) SYRINGE
PREFILLED_SYRINGE | INTRAVENOUS | Status: AC
  Filled 2020-09-25: qty 10

## 2020-09-25 MED ORDER — LIDOCAINE-EPINEPHRINE 1 %-1:100000 IJ SOLN
INTRAMUSCULAR | Status: DC | PRN
  Administered 2020-09-25: 4.5 mL
  Administered 2020-09-25: 5 mL

## 2020-09-25 MED ORDER — THROMBIN 5000 UNITS EX SOLR
CUTANEOUS | Status: AC
  Filled 2020-09-25: qty 5000

## 2020-09-25 MED ORDER — FENTANYL CITRATE (PF) 250 MCG/5ML IJ SOLN
INTRAMUSCULAR | Status: DC | PRN
  Administered 2020-09-25 (×3): 50 ug via INTRAVENOUS
  Administered 2020-09-25: 25 ug via INTRAVENOUS
  Administered 2020-09-25: 50 ug via INTRAVENOUS
  Administered 2020-09-25: 25 ug via INTRAVENOUS

## 2020-09-25 MED ORDER — ORAL CARE MOUTH RINSE: 15.0000 mL | Freq: Once | OROMUCOSAL | Status: AC

## 2020-09-25 MED ORDER — BUPIVACAINE HCL (PF) 0.5 % IJ SOLN
INTRAMUSCULAR | Status: DC | PRN
  Administered 2020-09-25: 5 mL
  Administered 2020-09-25: 19.5 mL
  Administered 2020-09-25: 4.5 mL

## 2020-09-25 MED ORDER — PROPOFOL 10 MG/ML IV BOLUS
INTRAVENOUS | Status: AC
  Filled 2020-09-25: qty 40

## 2020-09-25 MED ORDER — ROSUVASTATIN CALCIUM 20 MG PO TABS
20.0000 mg | ORAL_TABLET | Freq: Every day | ORAL | Status: DC
  Administered 2020-09-25: 20 mg via ORAL
  Filled 2020-09-25: qty 1

## 2020-09-25 MED ORDER — BUPIVACAINE HCL (PF) 0.5 % IJ SOLN
INTRAMUSCULAR | Status: AC
  Filled 2020-09-25: qty 30

## 2020-09-25 MED ORDER — OXYCODONE-ACETAMINOPHEN 5-325 MG PO TABS
1.0000 | ORAL_TABLET | ORAL | Status: DC | PRN
  Administered 2020-09-25 – 2020-09-26 (×2): 1 via ORAL
  Filled 2020-09-25 (×2): qty 1

## 2020-09-25 MED ORDER — POLYETHYLENE GLYCOL 3350 17 G PO PACK: 17.0000 g | PACK | Freq: Every day | ORAL | Status: DC | PRN

## 2020-09-25 MED ORDER — HYDROXYZINE HCL 50 MG/ML IM SOLN
50.0000 mg | Freq: Four times a day (QID) | INTRAMUSCULAR | Status: DC | PRN
  Administered 2020-09-25: 50 mg via INTRAMUSCULAR
  Filled 2020-09-25: qty 1

## 2020-09-25 MED ORDER — SUGAMMADEX SODIUM 200 MG/2ML IV SOLN
INTRAVENOUS | Status: DC | PRN
  Administered 2020-09-25: 200 mg via INTRAVENOUS

## 2020-09-25 MED ORDER — CHLORHEXIDINE GLUCONATE 0.12 % MT SOLN
15.0000 mL | Freq: Once | OROMUCOSAL | Status: AC
  Administered 2020-09-25: 15 mL via OROMUCOSAL
  Filled 2020-09-25: qty 15

## 2020-09-25 MED ORDER — LIDOCAINE 2% (20 MG/ML) 5 ML SYRINGE
INTRAMUSCULAR | Status: AC
  Filled 2020-09-25: qty 5

## 2020-09-25 MED ORDER — ONDANSETRON HCL 4 MG/2ML IJ SOLN
INTRAMUSCULAR | Status: AC
  Filled 2020-09-25: qty 2

## 2020-09-25 MED ORDER — CEFAZOLIN SODIUM-DEXTROSE 2-4 GM/100ML-% IV SOLN
2.0000 g | INTRAVENOUS | Status: AC
  Administered 2020-09-25: 2 g via INTRAVENOUS
  Filled 2020-09-25: qty 100

## 2020-09-25 MED ORDER — LACTATED RINGERS IV SOLN: INTRAVENOUS | Status: DC

## 2020-09-25 MED ORDER — SODIUM CHLORIDE 0.9% FLUSH: 3.0000 mL | INTRAVENOUS | Status: DC | PRN

## 2020-09-25 MED ORDER — ACETAMINOPHEN 325 MG PO TABS: 650.0000 mg | ORAL_TABLET | ORAL | Status: DC | PRN

## 2020-09-25 MED ORDER — ALUM & MAG HYDROXIDE-SIMETH 200-200-20 MG/5ML PO SUSP: 30.0000 mL | Freq: Four times a day (QID) | ORAL | Status: DC | PRN

## 2020-09-25 MED ORDER — ALBUTEROL SULFATE HFA 108 (90 BASE) MCG/ACT IN AERS
2.0000 | INHALATION_SPRAY | Freq: Four times a day (QID) | RESPIRATORY_TRACT | Status: DC | PRN
  Filled 2020-09-25: qty 6.7

## 2020-09-25 MED ORDER — LORAZEPAM 0.5 MG PO TABS: 0.5000 mg | ORAL_TABLET | Freq: Every day | ORAL | Status: DC | PRN

## 2020-09-25 MED ORDER — FLEET ENEMA 7-19 GM/118ML RE ENEM: 1.0000 | ENEMA | Freq: Once | RECTAL | Status: DC | PRN

## 2020-09-25 MED ORDER — AMITRIPTYLINE HCL 25 MG PO TABS
25.0000 mg | ORAL_TABLET | Freq: Every day | ORAL | Status: DC
  Administered 2020-09-25: 25 mg via ORAL
  Filled 2020-09-25: qty 1

## 2020-09-25 MED ORDER — DEXAMETHASONE SODIUM PHOSPHATE 10 MG/ML IJ SOLN
INTRAMUSCULAR | Status: AC
  Filled 2020-09-25: qty 1

## 2020-09-25 MED ORDER — METHOCARBAMOL 1000 MG/10ML IJ SOLN
500.0000 mg | Freq: Four times a day (QID) | INTRAVENOUS | Status: DC | PRN
  Filled 2020-09-25: qty 5

## 2020-09-25 MED ORDER — DOCUSATE SODIUM 100 MG PO CAPS
100.0000 mg | ORAL_CAPSULE | Freq: Two times a day (BID) | ORAL | Status: DC
  Administered 2020-09-25 (×2): 100 mg via ORAL
  Filled 2020-09-25 (×2): qty 1

## 2020-09-25 MED ORDER — 0.9 % SODIUM CHLORIDE (POUR BTL) OPTIME
TOPICAL | Status: DC | PRN
  Administered 2020-09-25: 1000 mL

## 2020-09-25 MED ORDER — PANTOPRAZOLE SODIUM 40 MG PO TBEC
40.0000 mg | DELAYED_RELEASE_TABLET | Freq: Once | ORAL | Status: AC
  Administered 2020-09-25: 40 mg via ORAL
  Filled 2020-09-25: qty 1

## 2020-09-25 MED ORDER — ALUM & MAG HYDROXIDE-SIMETH 200-200-20 MG/5ML PO SUSP
30.0000 mL | ORAL | Status: DC | PRN
  Administered 2020-09-25 – 2020-09-26 (×2): 30 mL via ORAL
  Filled 2020-09-25 (×2): qty 30

## 2020-09-25 MED ORDER — LIDOCAINE-EPINEPHRINE 1 %-1:100000 IJ SOLN
INTRAMUSCULAR | Status: AC
  Filled 2020-09-25: qty 1

## 2020-09-25 MED ORDER — CHLORHEXIDINE GLUCONATE CLOTH 2 % EX PADS: 6.0000 | MEDICATED_PAD | Freq: Once | CUTANEOUS | Status: DC

## 2020-09-25 MED ORDER — FENTANYL CITRATE (PF) 100 MCG/2ML IJ SOLN: 25.0000 ug | INTRAMUSCULAR | Status: DC | PRN

## 2020-09-25 MED ORDER — ONDANSETRON HCL 4 MG/2ML IJ SOLN
INTRAMUSCULAR | Status: DC | PRN
  Administered 2020-09-25: 4 mg via INTRAVENOUS

## 2020-09-25 MED ORDER — ONDANSETRON HCL 4 MG/2ML IJ SOLN
4.0000 mg | Freq: Once | INTRAMUSCULAR | Status: AC
  Administered 2020-09-25: 4 mg via INTRAVENOUS

## 2020-09-25 MED ORDER — TRIAMTERENE-HCTZ 37.5-25 MG PO TABS
0.5000 | ORAL_TABLET | Freq: Every day | ORAL | Status: DC
  Filled 2020-09-25: qty 0.5

## 2020-09-25 MED ORDER — DEXAMETHASONE SODIUM PHOSPHATE 10 MG/ML IJ SOLN
INTRAMUSCULAR | Status: DC | PRN
  Administered 2020-09-25: 10 mg via INTRAVENOUS

## 2020-09-25 MED ORDER — ROCURONIUM BROMIDE 10 MG/ML (PF) SYRINGE
PREFILLED_SYRINGE | INTRAVENOUS | Status: DC | PRN
  Administered 2020-09-25: 30 mg via INTRAVENOUS
  Administered 2020-09-25: 40 mg via INTRAVENOUS

## 2020-09-25 MED ORDER — MENTHOL 3 MG MT LOZG: 1.0000 | LOZENGE | OROMUCOSAL | Status: DC | PRN

## 2020-09-25 MED ORDER — PROPOFOL 10 MG/ML IV BOLUS
INTRAVENOUS | Status: DC | PRN
  Administered 2020-09-25: 120 mg via INTRAVENOUS

## 2020-09-25 MED ORDER — ONDANSETRON HCL 4 MG/2ML IJ SOLN
4.0000 mg | Freq: Four times a day (QID) | INTRAMUSCULAR | Status: DC | PRN
  Administered 2020-09-25: 4 mg via INTRAVENOUS
  Filled 2020-09-25: qty 2

## 2020-09-25 MED ORDER — SENNA 8.6 MG PO TABS
1.0000 | ORAL_TABLET | Freq: Two times a day (BID) | ORAL | Status: DC
  Administered 2020-09-25 (×2): 8.6 mg via ORAL
  Filled 2020-09-25 (×2): qty 1

## 2020-09-25 MED ORDER — ACETAMINOPHEN 650 MG RE SUPP: 650.0000 mg | RECTAL | Status: DC | PRN

## 2020-09-25 MED ORDER — BISACODYL 10 MG RE SUPP: 10.0000 mg | Freq: Every day | RECTAL | Status: DC | PRN

## 2020-09-25 MED ORDER — LIDOCAINE 2% (20 MG/ML) 5 ML SYRINGE
INTRAMUSCULAR | Status: DC | PRN
  Administered 2020-09-25: 60 mg via INTRAVENOUS

## 2020-09-25 MED ORDER — TRIAMCINOLONE ACETONIDE 55 MCG/ACT NA AERO
1.0000 | INHALATION_SPRAY | Freq: Every day | NASAL | Status: DC | PRN
  Filled 2020-09-25: qty 10.8

## 2020-09-25 MED ORDER — KETOROLAC TROMETHAMINE 15 MG/ML IJ SOLN
7.5000 mg | Freq: Four times a day (QID) | INTRAMUSCULAR | Status: AC
  Administered 2020-09-25 – 2020-09-26 (×4): 7.5 mg via INTRAVENOUS
  Filled 2020-09-25 (×4): qty 1

## 2020-09-25 MED ORDER — LEVOTHYROXINE SODIUM 25 MCG PO TABS
50.0000 ug | ORAL_TABLET | Freq: Every day | ORAL | Status: DC
  Administered 2020-09-26: 50 ug via ORAL
  Filled 2020-09-25: qty 2

## 2020-09-25 MED ORDER — THROMBIN 5000 UNITS EX SOLR
OROMUCOSAL | Status: DC | PRN
  Administered 2020-09-25: 5 mL via TOPICAL

## 2020-09-25 MED ORDER — ONDANSETRON HCL 4 MG PO TABS: 4.0000 mg | ORAL_TABLET | Freq: Four times a day (QID) | ORAL | Status: DC | PRN

## 2020-09-25 MED ORDER — CEFAZOLIN SODIUM-DEXTROSE 2-4 GM/100ML-% IV SOLN
2.0000 g | Freq: Three times a day (TID) | INTRAVENOUS | Status: AC
  Administered 2020-09-25 – 2020-09-26 (×2): 2 g via INTRAVENOUS
  Filled 2020-09-25 (×2): qty 100

## 2020-09-25 MED ORDER — PHENOL 1.4 % MT LIQD: 1.0000 | OROMUCOSAL | Status: DC | PRN

## 2020-09-25 MED ORDER — SUCCINYLCHOLINE CHLORIDE 200 MG/10ML IV SOSY
PREFILLED_SYRINGE | INTRAVENOUS | Status: AC
  Filled 2020-09-25: qty 10

## 2020-09-25 MED ORDER — FENTANYL CITRATE (PF) 250 MCG/5ML IJ SOLN
INTRAMUSCULAR | Status: AC
  Filled 2020-09-25: qty 5

## 2020-09-25 MED ORDER — SODIUM CHLORIDE 0.9% FLUSH: 3.0000 mL | Freq: Two times a day (BID) | INTRAVENOUS | Status: DC

## 2020-09-25 MED ORDER — IRBESARTAN 150 MG PO TABS: 300.0000 mg | ORAL_TABLET | Freq: Every day | ORAL | Status: DC

## 2020-09-25 MED ORDER — FAMOTIDINE 20 MG PO TABS
10.0000 mg | ORAL_TABLET | Freq: Every day | ORAL | Status: DC | PRN
  Administered 2020-09-25: 10 mg via ORAL
  Filled 2020-09-25: qty 1

## 2020-09-25 SURGICAL SUPPLY — 51 items
ADH SKN CLS APL DERMABOND .7 (GAUZE/BANDAGES/DRESSINGS) ×1
BAND INSRT 18 STRL LF DISP RB (MISCELLANEOUS) ×2
BAND RUBBER #18 3X1/16 STRL (MISCELLANEOUS) ×2 IMPLANT
BLADE CLIPPER SURG (BLADE) IMPLANT
BUR ACORN 6.0 (BURR) ×1 IMPLANT
BUR MATCHSTICK NEURO 3.0 LAGG (BURR) ×2 IMPLANT
CANISTER SUCT 3000ML PPV (MISCELLANEOUS) ×2 IMPLANT
COVER WAND RF STERILE (DRAPES) ×2 IMPLANT
DECANTER SPIKE VIAL GLASS SM (MISCELLANEOUS) ×2 IMPLANT
DERMABOND ADVANCED (GAUZE/BANDAGES/DRESSINGS) ×1
DERMABOND ADVANCED .7 DNX12 (GAUZE/BANDAGES/DRESSINGS) ×1 IMPLANT
DEVICE DISSECT PLASMABLAD 3.0S (MISCELLANEOUS) ×1 IMPLANT
DRAPE HALF SHEET 40X57 (DRAPES) ×1 IMPLANT
DRAPE LAPAROTOMY 100X72X124 (DRAPES) ×2 IMPLANT
DRAPE MICROSCOPE LEICA (MISCELLANEOUS) ×1 IMPLANT
DRSG OPSITE POSTOP 4X6 (GAUZE/BANDAGES/DRESSINGS) ×1 IMPLANT
DURAPREP 26ML APPLICATOR (WOUND CARE) ×2 IMPLANT
ELECT REM PT RETURN 9FT ADLT (ELECTROSURGICAL) ×2
ELECTRODE REM PT RTRN 9FT ADLT (ELECTROSURGICAL) ×1 IMPLANT
GAUZE 4X4 16PLY RFD (DISPOSABLE) IMPLANT
GAUZE SPONGE 4X4 12PLY STRL (GAUZE/BANDAGES/DRESSINGS) ×2 IMPLANT
GLOVE BIOGEL PI IND STRL 8.5 (GLOVE) ×1 IMPLANT
GLOVE BIOGEL PI INDICATOR 8.5 (GLOVE) ×1
GLOVE ECLIPSE 8.5 STRL (GLOVE) ×2 IMPLANT
GLOVE ECLIPSE 9.0 STRL (GLOVE) ×1 IMPLANT
GLOVE SURG UNDER POLY LF SZ6.5 (GLOVE) ×2 IMPLANT
GOWN STRL REUS W/ TWL LRG LVL3 (GOWN DISPOSABLE) IMPLANT
GOWN STRL REUS W/ TWL XL LVL3 (GOWN DISPOSABLE) IMPLANT
GOWN STRL REUS W/TWL 2XL LVL3 (GOWN DISPOSABLE) ×2 IMPLANT
GOWN STRL REUS W/TWL LRG LVL3 (GOWN DISPOSABLE) ×4
GOWN STRL REUS W/TWL XL LVL3 (GOWN DISPOSABLE) ×2
HEMOSTAT POWDER SURGIFOAM 1G (HEMOSTASIS) ×1 IMPLANT
KIT BASIN OR (CUSTOM PROCEDURE TRAY) ×2 IMPLANT
KIT TURNOVER KIT B (KITS) ×2 IMPLANT
NDL SPNL 20GX3.5 QUINCKE YW (NEEDLE) IMPLANT
NEEDLE HYPO 22GX1.5 SAFETY (NEEDLE) ×2 IMPLANT
NEEDLE SPNL 20GX3.5 QUINCKE YW (NEEDLE) ×2 IMPLANT
NS IRRIG 1000ML POUR BTL (IV SOLUTION) ×2 IMPLANT
PACK LAMINECTOMY NEURO (CUSTOM PROCEDURE TRAY) ×2 IMPLANT
PAD ARMBOARD 7.5X6 YLW CONV (MISCELLANEOUS) ×3 IMPLANT
PATTIES SURGICAL .5 X1 (DISPOSABLE) ×2 IMPLANT
PLASMABLADE 3.0S (MISCELLANEOUS) ×2
SPONGE SURGIFOAM ABS GEL SZ50 (HEMOSTASIS) ×1 IMPLANT
SUT VIC AB 1 CT1 18XBRD ANBCTR (SUTURE) ×1 IMPLANT
SUT VIC AB 1 CT1 8-18 (SUTURE) ×4
SUT VIC AB 2-0 CP2 18 (SUTURE) ×3 IMPLANT
SUT VIC AB 3-0 SH 8-18 (SUTURE) ×3 IMPLANT
SUT VIC AB 4-0 RB1 18 (SUTURE) ×3 IMPLANT
TOWEL GREEN STERILE (TOWEL DISPOSABLE) ×2 IMPLANT
TOWEL GREEN STERILE FF (TOWEL DISPOSABLE) ×2 IMPLANT
WATER STERILE IRR 1000ML POUR (IV SOLUTION) ×2 IMPLANT

## 2020-09-25 NOTE — Anesthesia Procedure Notes (Signed)
Procedure Name: Intubation Date/Time: 09/25/2020 7:55 AM Performed by: Griffin Dakin, CRNA Pre-anesthesia Checklist: Patient identified, Emergency Drugs available, Suction available and Patient being monitored Patient Re-evaluated:Patient Re-evaluated prior to induction Oxygen Delivery Method: Circle system utilized Preoxygenation: Pre-oxygenation with 100% oxygen Induction Type: IV induction Ventilation: Mask ventilation without difficulty Laryngoscope Size: Mac and 4 Grade View: Grade I Tube type: Oral Tube size: 7.0 mm Number of attempts: 1 Airway Equipment and Method: Stylet and Oral airway Placement Confirmation: ETT inserted through vocal cords under direct vision,  positive ETCO2 and breath sounds checked- equal and bilateral Secured at: 22 cm Tube secured with: Tape Dental Injury: Teeth and Oropharynx as per pre-operative assessment

## 2020-09-25 NOTE — Transfer of Care (Signed)
Immediate Anesthesia Transfer of Care Note  Patient: Sabrina Parks  Procedure(s) Performed: Lumbar two-three Lumbar three-four Lumbar four-five  Laminectomy and foraminotomy (N/A Back)  Patient Location: PACU  Anesthesia Type:General  Level of Consciousness: awake, alert  and oriented  Airway & Oxygen Therapy: Patient Spontanous Breathing and Patient connected to face mask oxygen  Post-op Assessment: Report given to RN and Post -op Vital signs reviewed and stable  Post vital signs: Reviewed and stable  Last Vitals:  Vitals Value Taken Time  BP 129/54 09/25/20 1056  Temp    Pulse 65 09/25/20 1059  Resp 19 09/25/20 1059  SpO2 98 % 09/25/20 1059  Vitals shown include unvalidated device data.  Last Pain:  Vitals:   09/25/20 0640  TempSrc:   PainSc: 0-No pain         Complications: No complications documented.

## 2020-09-25 NOTE — Progress Notes (Signed)
Orthopedic Tech Progress Note Patient Details:  Sabrina Parks 1941/03/30 [id number] Called in order to [facility] for an [place]  Patient ID: Sabrina Parks, female   DOB: 02-16-1941, 80 y.o.   MRN: [id number]   [name] 09/25/2020, 11:29 AM

## 2020-09-25 NOTE — Anesthesia Postprocedure Evaluation (Signed)
Anesthesia Post Note  Patient: Sabrina Parks  Procedure(s) Performed: Lumbar two-three Lumbar three-four Lumbar four-five  Laminectomy and foraminotomy (N/A Back)     Patient location during evaluation: PACU Anesthesia Type: General Level of consciousness: awake Pain management: pain level controlled Vital Signs Assessment: post-procedure vital signs reviewed and stable Respiratory status: spontaneous breathing Cardiovascular status: stable Postop Assessment: no apparent nausea or vomiting Anesthetic complications: no   No complications documented.  Last Vitals:  Vitals:   09/25/20 1141 09/25/20 1225  BP: (!) 116/51 (!) 124/54  Pulse: 68 69  Resp: 17 18  Temp:  36.5 C  SpO2: 98% 99%    Last Pain:  Vitals:   09/25/20 1225  TempSrc: Oral  PainSc:                  Inayah Woodin

## 2020-09-25 NOTE — Op Note (Signed)
Date of surgery: 09/25/2020 Preoperative diagnosis: Lumbar spine stenosis with neurogenic claudication and lumbar radiculopathy.  Lumbar scoliosis. Postoperative diagnosis: Same Procedure: Bilateral laminotomies and foraminotomies L2-3 L3-4 L4-5 with decompression of the central canal and the L2-L3-L4 and L5 nerve roots. Surgeon: Kristeen Miss First Assistant: Deri Fuelling, MD Anesthesia: General endotracheal Indications: Sabrina Parks is a 80 year old individual who has had significant back and bilateral lower extremity pain for a period of 6 or 7 years.  She has had all manner of conservative management including extensive physical therapy and injections she primarily had right lumbar radiculopathy but now has developed left lumbar radiculopathy and neuro genic claudication symptoms that are unrelenting.  She has been advised regarding surgical decompression via multilevel laminectomy.  Procedure: Patient was brought to the operating room supine on the stretcher.  After the smooth induction of general endotracheal anesthesia, she was carefully turned prone.  The back was prepped with alcohol DuraPrep and draped in a sterile fashion.  Midline incision was created and carried down to the lumbodorsal fascia first localizing radiograph identified the spinous process of L5 and L4.  Then by carrying out dissection into the subperiosteal region the laminar arches of L5 L4 and L3 were exposed.  Self-retaining retractor was placed into this wound and inferior margin lamina of L4 was partially removed with a laminotomy.  A second radiograph confirmed the presence of the L4-5 interspace.  Laminotomy was then created on the left side first removing a substantial quantity of severely degenerated desiccated mentis material which created substantial lateral recess stenosis.  The facet was undercut approximately a third of the way.  Common dural tube was identified and protected and then the area was explored there is no  to be a significant chronic disc herniation on the on this left side and it was felt that it was creating a substantial amount of the foraminal stenosis in the subarticular space.  The disc space was incised and several fragments of substantially degenerated disc material removed most of the disc was firmly attached to the ligament and partial ligament ectomy was performed in this region the disc space was entered and several other fragments of degenerated disc material were removed also the space was carefully cleared to clear the path of the common dural tube the L5 nerve root inferiorly and the foramen for the L4 nerve root superiorly once this was accomplished hemostasis was achieved and a pledgeted cottonoid was left over this laminotomy site with some Marcaine soaking.  The similar decompression was then performed on the right side here there was no evidence of disc herniation itself.  Common dural tube was decompressed as was the path of the L4 nerve root superiorly L5 nerve root inferiorly.  Then a similar procedure was carried out at L3-4 with bilateral laminotomies and foraminotomies a 2 mm and 3 mm Kerrison punch were used for the bulk of decompression with a curved 2 mm punch being used to retrieve fragments of the superior articular process of the L3 facet that were abutting the superior exiting nerve root.  Once this decompression was accomplished the same process was carried out at L2-3 with less substantial pathology being noted at this level and then we could sound the path of the L2 the L3 the L4 and L5 nerve roots individually this space was again inspected and no other fragments of disc material could be identified hemostasis in the soft tissues was obtained meticulously and then a total of 15 cc of half percent Marcaine  was injected into the paraspinous fascia prior to closure.  The lumbodorsal fascia was closed with #1 Vicryl interrupted fashion 2-0 Vicryl was used in the subcutaneous tissues  3-0 Vicryl subcuticularly along with some final layer of 4-0 subcuticular Vicryl.  Dermabond was placed on the skin blood loss was estimated at 100 cc.  Dry sterile dressing was applied the patient was returned to recovery room in stable condition.

## 2020-09-25 NOTE — Progress Notes (Signed)
Patient ID: Sabrina Parks, female   DOB: 03-Dec-1940, 80 y.o.   MRN: [id number] Doing well post op

## 2020-09-25 NOTE — Evaluation (Signed)
Occupational Therapy Evaluation and Discharge Patient Details Name: Sabrina Parks MRN: [id number] DOB: June 03, 1941 Today's Date: 09/25/2020    History of Present Illness Bilateral laminotomies and foraminotomies L2-3 L3-4 L4-5 with decompression of the central canal and the L2-L3-L4 and L5 nerve roots.   Clinical Impression   This 80 yo female admitted and underwent above presents to acute OT with all education completed, we will D/C from acute OT.    Follow Up Recommendations  No OT follow up;Supervision - Intermittent    Equipment Recommendations  None recommended by OT       Precautions / Restrictions Precautions Precautions: Back Precaution Booklet Issued: Yes (comment) Required Braces or Orthoses: Spinal Brace Spinal Brace: Applied in sitting position Restrictions Weight Bearing Restrictions: No      Mobility Bed Mobility Overal bed mobility: Needs Assistance Bed Mobility: Rolling;Sidelying to Sit;Sit to Sidelying Rolling: Supervision Sidelying to sit: Supervision     Sit to sidelying: Supervision General bed mobility comments: VCs for sequencing    Transfers Overall transfer level: Needs assistance Equipment used: None Transfers: Sit to/from Stand Sit to Stand: Supervision         General transfer comment: S walking up and down hallway    Balance Overall balance assessment: Mild deficits observed, not formally tested                                         ADL either performed or assessed with clinical judgement   ADL Overall ADL's : Needs assistance/impaired Eating/Feeding: Independent;Sitting   Grooming: Supervision/safety;Standing;Wash/dry hands Grooming Details (indicate cue type and reason): educated on using 2 cups to brush teeth to avoid bending over sink Upper Body Bathing: Set up;Sitting   Lower Body Bathing: Supervison/ safety;Sit to/from stand   Upper Body Dressing : Set up;Sitting   Lower Body Dressing:  Supervision/safety;Sit to/from stand Lower Body Dressing Details (indicate cue type and reason): educated on crossing legs to get to feet and sequence of dressing to make it easier Toilet Transfer: Supervision/safety;Ambulation;Comfort height toilet;Grab bars   Toileting- Clothing Manipulation and Hygiene: Supervision/safety;Sit to/from stand          Educated on not sitting for more than 20-30 minutes at at time and build up to 1 hour. Using wet wipes for back peri care.     Vision Patient Visual Report: No change from baseline              Pertinent Vitals/Pain Pain Assessment: Faces Faces Pain Scale: Hurts a little bit Pain Location: right lateral thigh Pain Descriptors / Indicators: Sore Pain Intervention(s): Limited activity within patient's tolerance;Monitored during session;Premedicated before session     Hand Dominance Right   Extremity/Trunk Assessment Upper Extremity Assessment Upper Extremity Assessment: Overall WFL for tasks assessed           Communication Communication Communication: No difficulties   Cognition Arousal/Alertness: Awake/alert Behavior During Therapy: WFL for tasks assessed/performed Overall Cognitive Status: Within Functional Limits for tasks assessed                                                Home Living Family/patient expects to be discharged to:: Private residence Living Arrangements: Spouse/significant other Available Help at Discharge: Family;Available 24 hours/day Type of Home: House Home Access: Stairs  to enter Entrance Stairs-Number of Steps: 8 at back (one rail); 3 at front (2 rails)   Home Layout: Two level;Able to live on main level with bedroom/bathroom     Bathroom Shower/Tub: Walk-in shower;Door   Bathroom Toilet: Handicapped height     Home Equipment: None          Prior Functioning/Environment Level of Independence: Independent                 OT Problem List: Impaired  balance (sitting and/or standing);Pain         OT Goals(Current goals can be found in the care plan section) Acute Rehab OT Goals Patient Stated Goal: home tomorrow  OT Frequency:                AM-PAC OT "6 Clicks" Daily Activity     Outcome Measure Help from another person eating meals?: None Help from another person taking care of personal grooming?: None Help from another person toileting, which includes using toliet, bedpan, or urinal?: None Help from another person bathing (including washing, rinsing, drying)?: A Little Help from another person to put on and taking off regular upper body clothing?: A Little Help from another person to put on and taking off regular lower body clothing?: A Little 6 Click Score: 21   End of Session Equipment Utilized During Treatment: Gait belt;Back brace Nurse Communication:  (no further OT needs, walked and urinated)  Activity Tolerance: Patient tolerated treatment well Patient left: in bed;with call bell/phone within reach;with family/visitor present  OT Visit Diagnosis: Unsteadiness on feet (R26.81);Muscle weakness (generalized) (M62.81);Pain Pain - part of body:  (incisional)                Time: 1455-1530 OT Time Calculation (min): 35 min Charges:  OT General Charges $OT Visit: 1 Visit OT Evaluation $OT Eval Moderate Complexity: 1 Mod OT Treatments $Self Care/Home Management : 8-22 mins  Golden Circle, OTR/L Acute [employer] Pager [phone] Office [phone]     Almon Register 09/25/2020, 4:09 PM

## 2020-09-25 NOTE — H&P (Signed)
Sabrina Parks is an 80 y.o. female.   Chief Complaint: Back bilateral hip and leg pain for years right worse than left HPI: Sabrina Parks is a 80 year old individual whom I have been following for a number of years she had an initial MRI back in 2016 when she was having mostly right lower extremity pain with activity and it was noted that she had spondylitic stenosis at L4-L5 she had a mild degenerative scoliosis that has persisted and over the years she responded well initially to conservative management with a program of stretching exercises a pool exercise program and intermittent epidural injections.  Lately these have become increasingly refractory.  A recent MRI demonstrates that she is advanced significant spondylitic stenosis at L2 334 and again at L4-5.  There is a slight grade 1 spondylolisthesis at L4-5 and in terms of discomfort is mostly in the hips and lower extremities.  I have advised that we should do bilateral laminotomies and decompression at L3 [phone] to alleviate the worst of the stenosis.  We will not be doing a fusion.  [name] has made every effort to be treated conservatively but given the progression of this process I believe at this time surgical decompression is an appropriate consideration.  Past Medical History:  Diagnosis Date  . Alopecia   . Arthritis   . Complication of anesthesia   . DJD (degenerative joint disease), cervical 5/02   Kha Hari  . GERD (gastroesophageal reflux disease)   . Headache   . HSV-1 infection   . Hyperlipidemia   . Hypertension   . Hypothyroidism   . Lichen sclerosus 18/29/9371   biopsy proven  . Liver hemangioma 2011   Dr. Earlean Shawl  . PONV (postoperative nausea and vomiting)   . Raynaud's disease   . Thyroiditis   . Transient global amnesia    not fully diag    Past Surgical History:  Procedure Laterality Date  . APPENDECTOMY  1956  . CARDIOVASCULAR STRESS TEST  10/29/2006   EF 78%.   Marland Kitchen CATARACT EXTRACTION Bilateral   . CYSTOCELE  REPAIR N/A 12/29/2018   Procedure: exam under anesthesia;  Surgeon: Bjorn Loser, MD;  Location: [facility];  Service: Urology;  Laterality: N/A;  . LOOP RECORDER INSERTION N/A 05/01/2017   Procedure: LOOP RECORDER INSERTION;  Surgeon: Evans Lance, MD;  Location: [facility];  Service: Cardiovascular;  Laterality: N/A;  . ROBOTIC ASSISTED LAPAROSCOPIC SACROCOLPOPEXY N/A 02/25/2019   Procedure: XI ROBOTIC ASSISTED LAPAROSCOPIC SACROCOLPOPEXY;  Surgeon: [name], MD;  Location: [facility];  Service: Urology;  Laterality: N/A;  . ROTATOR CUFF REPAIR Bilateral   . SHOULDER SURGERY    . TONSILLECTOMY AND ADENOIDECTOMY  1957  . TOTAL ABDOMINAL HYSTERECTOMY W/ BILATERAL SALPINGOOPHORECTOMY  7/90  . US ECHOCARDIOGRAPHY  10/11/2009   EF 55-60%    Family History  Problem Relation Age of Onset  . Stroke Mother   . Cancer Father   . Colon cancer Father        45's   Social History:  reports that she quit smoking about 50 years ago. Her smoking use included cigarettes. She quit after 10.00 years of use. She has never used smokeless tobacco. She reports current alcohol use. She reports that she does not use drugs.  Allergies:  Allergies  Allergen Reactions  . Codeine Nausea Only  . Morphine And Related Hives  . Sulfa Antibiotics Rash    Medications Prior to Admission  Medication Sig Dispense Refill  . acetaminophen (TYLENOL) 500  MG tablet Take 500 mg by mouth every 6 (six) hours as needed for moderate pain or headache.    Marland Kitchen amitriptyline (ELAVIL) 25 MG tablet Take 25 mg by mouth at bedtime.     Marland Kitchen aspirin EC 81 MG tablet Take 81 mg by mouth at bedtime.    . famotidine (PEPCID) 10 MG tablet Take 10 mg by mouth daily as needed for heartburn or indigestion.    Marland Kitchen levothyroxine (SYNTHROID, LEVOTHROID) 50 MCG tablet Take 50 mcg by mouth daily before breakfast.    . LORazepam (ATIVAN) 0.5 MG tablet Take 0.5 mg by mouth daily as needed for anxiety.     Marland Kitchen olmesartan (BENICAR) 40 MG  tablet Take 40 mg by mouth at bedtime.     Marland Kitchen PROAIR HFA 108 (90 BASE) MCG/ACT inhaler Inhale 2 puffs into the lungs every 6 (six) hours as needed for wheezing or shortness of breath.     . rosuvastatin (CRESTOR) 20 MG tablet Take 1 tablet (20 mg total) by mouth daily. 90 tablet 3  . triamterene-hydrochlorothiazide (MAXZIDE-25) 37.5-25 MG tablet Take 0.5 tablets by mouth daily. 45 tablet 2  . clobetasol ointment (TEMOVATE) 7.98 % Apply 1 application topically 2 (two) times daily. Apply as directed twice daily.  Use for up to 7 days. (Patient not taking: No sig reported) 30 g 1  . ibuprofen (ADVIL,MOTRIN) 200 MG tablet Take 200 mg by mouth every 8 (eight) hours as needed for headache or moderate pain.     Marland Kitchen triamcinolone (NASACORT) 55 MCG/ACT AERO nasal inhaler Place 1 spray into the nose daily as needed (for allergies).      No results found for this or any previous visit (from the past 48 hour(s)). No results found.  Review of Systems  Constitutional: Positive for activity change.  HENT: Negative.   Eyes: Negative.   Respiratory: Negative.   Cardiovascular: Negative.   Gastrointestinal: Negative.   Endocrine: Negative.   Genitourinary: Negative.   Musculoskeletal: Positive for back pain, gait problem and myalgias.  Neurological: Positive for weakness and numbness.  Hematological: Negative.   Psychiatric/Behavioral: Negative.     Blood pressure (!) 134/52, pulse 78, temperature (!) 97.1 F (36.2 C), temperature source Oral, resp. rate 18, height 5\' 3"  (1.6 m), weight 61.2 kg, last menstrual period 07/29/1990, SpO2 99 %. Physical Exam Constitutional:      Appearance: Normal appearance. She is normal weight.  HENT:     Head: Normocephalic and atraumatic.     Nose: Nose normal.     Mouth/Throat:     Mouth: Mucous membranes are dry.  Eyes:     Extraocular Movements: Extraocular movements intact.     Pupils: Pupils are equal, round, and reactive to light.  Cardiovascular:     Rate  and Rhythm: Normal rate and regular rhythm.     Pulses: Normal pulses.     Heart sounds: Normal heart sounds.  Pulmonary:     Effort: Pulmonary effort is normal.     Breath sounds: Normal breath sounds.  Abdominal:     General: Abdomen is flat.     Palpations: Abdomen is soft.  Musculoskeletal:     Cervical back: Normal range of motion and neck supple.     Comments: Has of straight leg raising at 45 degrees in either lower extremities Patrick's maneuver is negative bilaterally.  Tone and bulk in the major muscle groups is intact  Skin:    General: Skin is warm and dry.  Capillary Refill: Capillary refill takes less than 2 seconds.  Neurological:     Mental Status: She is alert.     Comments: Mild weakness in tibialis anterior more on the right than on the left 4 out of 5.  Gastroc strength appears intact reflexes are absent in the patellae and the Achilles both.  Upper extremity reflexes are intact.  Cranial nerve examination is normal.  Station and gait is intact  Psychiatric:        Mood and Affect: Mood normal.        Behavior: Behavior normal.        Thought Content: Thought content normal.        Judgment: Judgment normal.      Assessment/Plan Spondylosis and stenosis L2-3 L3-4 L4-5.  Neurogenic claudication, lumbar radiculopathy.  Degenerative scoliosis.  Plan: Bilateral laminotomies and foraminotomies L2-3 L3-4 L4-5.  Earleen Newport, MD 09/25/2020, 7:41 AM

## 2020-09-26 ENCOUNTER — Encounter (HOSPITAL_COMMUNITY): Payer: Self-pay | Admitting: Neurological Surgery

## 2020-09-26 DIAGNOSIS — M4186 Other forms of scoliosis, lumbar region: Secondary | ICD-10-CM | POA: Diagnosis not present

## 2020-09-26 DIAGNOSIS — Z87891 Personal history of nicotine dependence: Secondary | ICD-10-CM | POA: Diagnosis not present

## 2020-09-26 DIAGNOSIS — E039 Hypothyroidism, unspecified: Secondary | ICD-10-CM | POA: Diagnosis not present

## 2020-09-26 DIAGNOSIS — M48062 Spinal stenosis, lumbar region with neurogenic claudication: Secondary | ICD-10-CM | POA: Diagnosis not present

## 2020-09-26 DIAGNOSIS — I10 Essential (primary) hypertension: Secondary | ICD-10-CM | POA: Diagnosis not present

## 2020-09-26 DIAGNOSIS — M4727 Other spondylosis with radiculopathy, lumbosacral region: Secondary | ICD-10-CM | POA: Diagnosis not present

## 2020-09-26 MED ORDER — METHOCARBAMOL 500 MG PO TABS: 500.0000 mg | ORAL_TABLET | Freq: Four times a day (QID) | ORAL | 3 refills | Status: DC | PRN

## 2020-09-26 MED ORDER — TRAMADOL HCL 50 MG PO TABS: 50.0000 mg | ORAL_TABLET | Freq: Four times a day (QID) | ORAL | 0 refills | Status: DC | PRN

## 2020-09-26 MED ORDER — DEXAMETHASONE 1 MG PO TABS: ORAL_TABLET | ORAL | 0 refills | Status: DC

## 2020-09-26 NOTE — Progress Notes (Signed)
Patient ID: Sabrina Parks, female   DOB: 12/02/1940, 80 y.o.   MRN: [id number] Signs are stable Patient is noting that she feels much better in the lower extremities still has some distal right lower extremity discomfort.  Motor function appears good.  We will discharge home today

## 2020-09-26 NOTE — Progress Notes (Signed)
Pt doing well. Pt and husband given D/C instructions with verbal understanding. Rx's were sent to the pharmacy by MD. Pt's incision is clean and dry with no sign of infection. Pt's IV was removed prior to D/C. Pt D/C'd home via wheelchair per MD order. Pt is stable @ D/C and has no other needs at this time. Ashley Allred, RN  

## 2020-09-26 NOTE — Evaluation (Signed)
Physical Therapy Evaluation and Discharge Patient Details Name: Sabrina Parks MRN: [id number] DOB: Dec 19, 1940 Today's Date: 09/26/2020   History of Present Illness  Bilateral laminotomies and foraminotomies L2-3 L3-4 L4-5 with decompression of the central canal and the L2-L3-L4 and L5 nerve roots.  Clinical Impression  Patient evaluated by Physical Therapy with no further acute PT needs identified. All education has been completed and the patient has no further questions. Pt was able to demonstrate transfers and ambulation with gross modified independence and no AD. Pt was educated on precautions, brace application/wearing schedule, appropriate activity progression, and car transfer. See below for any follow-up Physical Therapy or equipment needs. PT is signing off. Thank you for this referral.     Follow Up Recommendations No PT follow up;Supervision for mobility/OOB    Equipment Recommendations  None recommended by PT    Recommendations for Other Services       Precautions / Restrictions Precautions Precautions: Back Precaution Booklet Issued: Yes (comment) Required Braces or Orthoses: Spinal Brace Spinal Brace: Applied in sitting position Restrictions Weight Bearing Restrictions: No      Mobility  Bed Mobility Overal bed mobility: Modified Independent Bed Mobility: Rolling;Sidelying to Sit Rolling: Modified independent (Device/Increase time) Sidelying to sit: Modified independent (Device/Increase time)       General bed mobility comments: HOB flat and rails lowered to simulate home environment.    Transfers Overall transfer level: Modified independent Equipment used: None Transfers: Sit to/from Stand Sit to Stand: Modified independent (Device/Increase time)         General transfer comment: Min cues for optimal log roll technique but was able to transition to EOB without difficulty.  Ambulation/Gait Ambulation/Gait assistance: Modified independent  (Device/Increase time) Gait Distance (Feet): 300 Feet Assistive device: None Gait Pattern/deviations: Step-through pattern;Decreased stride length;Trunk flexed Gait velocity: Decreased Gait velocity interpretation: 1.31 - 2.62 ft/sec, indicative of limited community ambulator General Gait Details: Slow but generally steady without overt LOB noted.  Stairs Stairs: Yes Stairs assistance: Min guard Stair Management: One rail Left;Step to pattern;Forwards Number of Stairs: 8 General stair comments: VC's for sequencing and general safety. No assist required.  Wheelchair Mobility    Modified Rankin (Stroke Patients Only)       Balance Overall balance assessment: Mild deficits observed, not formally tested                                           Pertinent Vitals/Pain Pain Assessment: Faces Faces Pain Scale: Hurts a little bit Pain Location: Incision site - back Pain Descriptors / Indicators: Sore Pain Intervention(s): Monitored during session;Limited activity within patient's tolerance;Repositioned    Home Living Family/patient expects to be discharged to:: Private residence Living Arrangements: Spouse/significant other Available Help at Discharge: Family;Available 24 hours/day Type of Home: House Home Access: Stairs to enter   [employer] of Steps: 8 at back (one rail); 3 at front (2 rails) Home Layout: Two level;Able to live on main level with bedroom/bathroom Home Equipment: None      Prior Function Level of Independence: Independent               Hand Dominance   Dominant Hand: Right    Extremity/Trunk Assessment   Upper Extremity Assessment Upper Extremity Assessment: Defer to OT evaluation    Lower Extremity Assessment Lower Extremity Assessment: Generalized weakness (Consistent with pre-op diagnosis)    Cervical / Trunk Assessment  Cervical / Trunk Assessment: Other exceptions Cervical / Trunk Exceptions: s/p surgery   Communication   Communication: No difficulties  Cognition Arousal/Alertness: Awake/alert Behavior During Therapy: WFL for tasks assessed/performed Overall Cognitive Status: Within Functional Limits for tasks assessed                                        General Comments      Exercises     Assessment/Plan    PT Assessment Patent does not need any further PT services  PT Problem List         PT Treatment Interventions      PT Goals (Current goals can be found in the Care Plan section)  Acute Rehab PT Goals Patient Stated Goal: Home today PT Goal Formulation: All assessment and education complete, DC therapy    Frequency     Barriers to discharge        Co-evaluation               AM-PAC PT "6 Clicks" Mobility  Outcome Measure Help needed turning from your back to your side while in a flat bed without using bedrails?: None Help needed moving from lying on your back to sitting on the side of a flat bed without using bedrails?: None Help needed moving to and from a bed to a chair (including a wheelchair)?: None Help needed standing up from a chair using your arms (e.g., wheelchair or bedside chair)?: None Help needed to walk in hospital room?: None Help needed climbing 3-5 steps with a railing? : A Little 6 Click Score: 23    End of Session Equipment Utilized During Treatment: Gait belt;Back brace Activity Tolerance: Patient tolerated treatment well Patient left: with call bell/phone within reach;with family/visitor present (Sitting EOB) Nurse Communication: Mobility status PT Visit Diagnosis: Unsteadiness on feet (R26.81);Pain Pain - part of body:  (back)    Time: 8590-9311 PT Time Calculation (min) (ACUTE ONLY): 15 min   Charges:   PT Evaluation $PT Eval Low Complexity: 1 Low          Rolinda Roan, PT, DPT Acute Rehabilitation Services Pager: [phone] Office: [phone]   Thelma Comp 09/26/2020, 11:37 AM

## 2020-09-26 NOTE — Discharge Summary (Addendum)
Physician Discharge Summary  Patient ID: Sabrina Parks MRN: [id number] DOB/AGE: 03/04/1941 80 y.o.  Admit date: 09/25/2020 Discharge date: 09/26/2020  Admission Diagnoses: Lumbar stenosis with neurogenic claudication lumbar radiculopathy.  Degenerative scoliosis  Discharge Diagnoses: Lumbar stenosis with neurogenic claudication.  Lumbar radiculopathy.  Degenerative scoliosis. Active Problems:   Lumbar stenosis with neurogenic claudication   Discharged Condition: good  Hospital Course: Patient was admitted to undergo a multilevel laminectomy decompression from L2-3 L3-4 and L4-5.  Discectomy was also performed at L4-5.  She tolerated surgery well.  Consults: None  Significant Diagnostic Studies: None  Treatments: surgery: See hospital course above  Discharge Exam: Blood pressure (!) 113/47, pulse 80, temperature 98.1 F (36.7 C), temperature source Oral, resp. rate 16, height 5\' 3"  (1.6 m), weight 61.2 kg, last menstrual period 07/29/1990, SpO2 98 %. Incision is clean and dry motor function is intact.  Disposition: Discharge disposition: 01-Home or Self Care       Discharge Instructions    Diet - low sodium heart healthy   Complete by: As directed    Discharge wound care:   Complete by: As directed    Okay to shower. Do not apply salves or appointments to incision. No heavy lifting with the upper extremities greater than 10 pounds. May resume driving when not requiring pain medication and patient feels comfortable with doing so.   Incentive spirometry RT   Complete by: As directed    Increase activity slowly   Complete by: As directed         Signed: Earleen Newport 09/26/2020, 8:34 AM

## 2020-09-26 NOTE — Progress Notes (Signed)
Carelink Summary Report / Loop Recorder 

## 2020-10-20 ENCOUNTER — Ambulatory Visit (INDEPENDENT_AMBULATORY_CARE_PROVIDER_SITE_OTHER): Payer: Medicare Other

## 2020-10-20 DIAGNOSIS — R55 Syncope and collapse: Secondary | ICD-10-CM

## 2020-10-22 LAB — CUP PACEART REMOTE DEVICE CHECK
Date Time Interrogation Session: 20220327025610
Implantable Pulse Generator Implant Date: 20181004

## 2020-10-24 DIAGNOSIS — E785 Hyperlipidemia, unspecified: Secondary | ICD-10-CM | POA: Diagnosis not present

## 2020-10-24 DIAGNOSIS — R7301 Impaired fasting glucose: Secondary | ICD-10-CM | POA: Diagnosis not present

## 2020-10-24 DIAGNOSIS — E039 Hypothyroidism, unspecified: Secondary | ICD-10-CM | POA: Diagnosis not present

## 2020-10-25 DIAGNOSIS — R202 Paresthesia of skin: Secondary | ICD-10-CM | POA: Diagnosis not present

## 2020-10-25 DIAGNOSIS — R7989 Other specified abnormal findings of blood chemistry: Secondary | ICD-10-CM | POA: Diagnosis not present

## 2020-10-25 DIAGNOSIS — D649 Anemia, unspecified: Secondary | ICD-10-CM | POA: Diagnosis not present

## 2020-10-30 DIAGNOSIS — Z13820 Encounter for screening for osteoporosis: Secondary | ICD-10-CM | POA: Diagnosis not present

## 2020-11-01 DIAGNOSIS — E039 Hypothyroidism, unspecified: Secondary | ICD-10-CM | POA: Diagnosis not present

## 2020-11-01 DIAGNOSIS — Z1331 Encounter for screening for depression: Secondary | ICD-10-CM | POA: Diagnosis not present

## 2020-11-01 DIAGNOSIS — J309 Allergic rhinitis, unspecified: Secondary | ICD-10-CM | POA: Diagnosis not present

## 2020-11-01 DIAGNOSIS — D692 Other nonthrombocytopenic purpura: Secondary | ICD-10-CM | POA: Diagnosis not present

## 2020-11-01 DIAGNOSIS — Z86010 Personal history of colonic polyps: Secondary | ICD-10-CM | POA: Diagnosis not present

## 2020-11-01 DIAGNOSIS — Z1339 Encounter for screening examination for other mental health and behavioral disorders: Secondary | ICD-10-CM | POA: Diagnosis not present

## 2020-11-01 DIAGNOSIS — I10 Essential (primary) hypertension: Secondary | ICD-10-CM | POA: Diagnosis not present

## 2020-11-01 DIAGNOSIS — R7301 Impaired fasting glucose: Secondary | ICD-10-CM | POA: Diagnosis not present

## 2020-11-01 DIAGNOSIS — D320 Benign neoplasm of cerebral meninges: Secondary | ICD-10-CM | POA: Diagnosis not present

## 2020-11-01 DIAGNOSIS — M25551 Pain in right hip: Secondary | ICD-10-CM | POA: Diagnosis not present

## 2020-11-01 DIAGNOSIS — R82998 Other abnormal findings in urine: Secondary | ICD-10-CM | POA: Diagnosis not present

## 2020-11-01 DIAGNOSIS — E785 Hyperlipidemia, unspecified: Secondary | ICD-10-CM | POA: Diagnosis not present

## 2020-11-01 DIAGNOSIS — Z1212 Encounter for screening for malignant neoplasm of rectum: Secondary | ICD-10-CM | POA: Diagnosis not present

## 2020-11-01 DIAGNOSIS — Z8673 Personal history of transient ischemic attack (TIA), and cerebral infarction without residual deficits: Secondary | ICD-10-CM | POA: Diagnosis not present

## 2020-11-01 DIAGNOSIS — Z0000 Encounter for general adult medical examination without abnormal findings: Secondary | ICD-10-CM | POA: Diagnosis not present

## 2020-11-02 NOTE — Progress Notes (Signed)
Carelink Summary Report / Loop Recorder 

## 2020-11-08 DIAGNOSIS — S81811A Laceration without foreign body, right lower leg, initial encounter: Secondary | ICD-10-CM | POA: Diagnosis not present

## 2020-11-20 DIAGNOSIS — H353211 Exudative age-related macular degeneration, right eye, with active choroidal neovascularization: Secondary | ICD-10-CM | POA: Diagnosis not present

## 2020-11-21 ENCOUNTER — Ambulatory Visit (INDEPENDENT_AMBULATORY_CARE_PROVIDER_SITE_OTHER): Payer: Medicare Other

## 2020-11-21 DIAGNOSIS — R55 Syncope and collapse: Secondary | ICD-10-CM | POA: Diagnosis not present

## 2020-11-21 LAB — CUP PACEART REMOTE DEVICE CHECK
Date Time Interrogation Session: 20220425231050
Implantable Pulse Generator Implant Date: 20181004

## 2020-11-22 ENCOUNTER — Ambulatory Visit (INDEPENDENT_AMBULATORY_CARE_PROVIDER_SITE_OTHER): Payer: Medicare Other

## 2020-11-22 ENCOUNTER — Other Ambulatory Visit: Payer: Self-pay

## 2020-11-22 ENCOUNTER — Ambulatory Visit (INDEPENDENT_AMBULATORY_CARE_PROVIDER_SITE_OTHER): Payer: Medicare Other | Admitting: Orthopaedic Surgery

## 2020-11-22 ENCOUNTER — Encounter: Payer: Self-pay | Admitting: Orthopaedic Surgery

## 2020-11-22 VITALS — Ht 63.0 in | Wt 135.0 lb

## 2020-11-22 DIAGNOSIS — M25551 Pain in right hip: Secondary | ICD-10-CM

## 2020-11-22 DIAGNOSIS — M1611 Unilateral primary osteoarthritis, right hip: Secondary | ICD-10-CM | POA: Diagnosis not present

## 2020-11-22 DIAGNOSIS — M7061 Trochanteric bursitis, right hip: Secondary | ICD-10-CM

## 2020-11-22 NOTE — Progress Notes (Signed)
Office Visit Note   Patient: Sabrina Parks           Date of Birth: Apr 08, 1941           MRN: [id number] Visit Date: 11/22/2020              Requested by: Ginger Organ., MD [address] [place],  [place] [address] PCP: Ginger Organ., MD   Assessment & Plan: Visit Diagnoses:  1. Pain in right hip   2. Trochanteric bursitis, right hip   3. Unilateral primary osteoarthritis, right hip    X-rays demonstrate some degenerative changes in the right hip that seem to be a little bit more progressive than the films performed several years ago.  I think it is worth from a diagnostic and therapeutic standpoint to have the right hip injected with cortisone.  Discussed this with Yetta and she would like to proceed.  Will refer to Dr. Ernestina Patches and then follow-up thereafter.  Consider MRI scan of right hip with little if any improvement Plan:   Follow-Up Instructions: Return Consult Dr. Ernestina Patches to perform an intra-articular right hip cortisone injection.   Orders:  Orders Placed This Encounter  Procedures  . XR HIP UNILAT W OR W/O PELVIS 2-3 VIEWS RIGHT  . Ambulatory referral to Physical Medicine Rehab   No orders of the defined types were placed in this encounter.     Procedures: No procedures performed   Clinical Data: No additional findings.   Subjective: Chief Complaint  Patient presents with  . Right Hip - Pain  Patient presents today for right hip pain. She said that she has seen Dr.Liz Pinho for this before and received a cortisone injection that did not help. She said that she is now 8weeks out from lower back surgery with Dr.Elsner. She continues to have this lateral hip pain. No groin pain and no buttock pain. She said that the pain occurs randomly with and without activity. Dr. Ellene Route expected the hip pain to resolve after the back surgery as it was a ruptured disc and some stenosis but Jakiera continues to have discomfort.  Had some lateral hip pain groin pain and  anterior thigh pain.  Prior films several years ago demonstrated little if any arthritic change.  She has had cortisone over the lateral aspect of her hip in the area of the bursa with some temporary relief in the past  HPI  Review of Systems   Objective: Vital Signs: Ht 5\' 3"  (1.6 m)   Wt 135 lb (61.2 kg)   LMP 07/29/1990   BMI 23.91 kg/m   Physical Exam Constitutional:      Appearance: She is well-developed.  Eyes:     Pupils: Pupils are equal, round, and reactive to light.  Pulmonary:     Effort: Pulmonary effort is normal.  Skin:    General: Skin is warm and dry.  Neurological:     Mental Status: She is alert and oriented to person, place, and time.  Psychiatric:        Behavior: Behavior normal.     Ortho Exam awake alert and oriented x3.  Comfortable sitting.  There is slightly decreased internal rotation of the right hip compared to the left but really not much pain.  Some mild discomfort over the greater trochanter but no fluctuation or induration.  No ecchymosis.  Neurologically intact  Specialty Comments:  No specialty comments available.  Imaging: XR HIP UNILAT W OR W/O PELVIS 2-3 VIEWS RIGHT  Result Date: 11/22/2020 AP pelvis and lateral of the right hip were obtained demonstrating some degenerative changes.  There is some calcification within the capsule and possibly consistent with CPPD.  There is a very small inferior osteophyte in the femoral head.  Joint space appears to be well-maintained.  Very minimal subchondral cyst formation on the femoral head.    PMFS History: Patient Active Problem List   Diagnosis Date Noted  . Unilateral primary osteoarthritis, right hip 11/22/2020  . Lumbar stenosis with neurogenic claudication 09/25/2020  . Trochanteric bursitis, right hip 04/18/2020  . Unilateral primary osteoarthritis, right knee 03/01/2020  . Pain due to onychomycosis of toenails of both feet 07/07/2019  . Syncope 06/01/2019  . Left bundle branch  block 06/01/2019  . Pelvic prolapse 02/25/2019  . Pain in right hip 02/23/2019  . 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 Lichen sclerosus et atrophicus 06/14/2016  . Hypothyroidism 06/14/2016  . Hypertension   . Hyperlipidemia   . TIA (transient ischemic attack) 06/12/2012  . Alopecia 08/01/2011   Past Medical History:  Diagnosis Date  . Alopecia   . Arthritis   . Complication of anesthesia   . DJD (degenerative joint disease), cervical 5/02   Elsner  . GERD (gastroesophageal reflux disease)   . Headache   . HSV-1 infection   . Hyperlipidemia   . Hypertension   . Hypothyroidism   . Lichen sclerosus 51/08/5850   biopsy proven  . Liver hemangioma 2011   Dr. Earlean Shawl  . PONV (postoperative nausea and vomiting)   . Raynaud's disease   . Thyroiditis   . Transient global amnesia    not fully diag    Family History  Problem Relation Age of Onset  . Stroke Mother   . Cancer Father   . Colon cancer Father        28's    Past Surgical History:  Procedure Laterality Date  . APPENDECTOMY  1956  . CARDIOVASCULAR STRESS TEST  10/29/2006   EF 78%.   Marland Kitchen CATARACT EXTRACTION Bilateral   . CYSTOCELE REPAIR N/A 12/29/2018   Procedure: exam under anesthesia;  Surgeon: Bjorn Loser, MD;  Location: [facility];  Service: Urology;  Laterality: N/A;  . LOOP RECORDER INSERTION N/A 05/01/2017   Procedure: LOOP RECORDER INSERTION;  Surgeon: Evans Lance, MD;  Location: [facility];  Service: Cardiovascular;  Laterality: N/A;  . LUMBAR LAMINECTOMY/DECOMPRESSION MICRODISCECTOMY N/A 09/25/2020   Procedure: Lumbar two-three Lumbar three-four Lumbar four-five  Laminectomy and foraminotomy;  Surgeon: Kristeen Miss, MD;  Location: [facility];  Service: Neurosurgery;  Laterality: N/A;  . ROBOTIC ASSISTED LAPAROSCOPIC SACROCOLPOPEXY N/A 02/25/2019   Procedure: XI ROBOTIC ASSISTED LAPAROSCOPIC SACROCOLPOPEXY;  Surgeon: [name], MD;  Location: [facility];  Service: Urology;  Laterality:  N/A;  . ROTATOR CUFF REPAIR Bilateral   . SHOULDER SURGERY    . TONSILLECTOMY AND ADENOIDECTOMY  1957  . TOTAL ABDOMINAL HYSTERECTOMY W/ BILATERAL SALPINGOOPHORECTOMY  7/90  . US ECHOCARDIOGRAPHY  10/11/2009   EF 55-60%   Social History   Occupational History  . Not on file  Tobacco Use  . Smoking status: Former Smoker    Years: 10.00    Types: Cigarettes    Quit date: 01/01/1970    Years since quitting: 50.9  . Smokeless tobacco: Never Used  Vaping Use  . Vaping Use: Never used  Substance and Sexual Activity  . Alcohol use: Yes    Comment: 1-2 glasses of wine   . Drug  use: No  . Sexual activity: Yes    Partners: Male    Comment: hysterectomy

## 2020-11-23 DIAGNOSIS — Z23 Encounter for immunization: Secondary | ICD-10-CM | POA: Diagnosis not present

## 2020-11-24 ENCOUNTER — Telehealth: Payer: Self-pay

## 2020-11-24 NOTE — Telephone Encounter (Signed)
[facility]link alert received for linq battery at [facility]. Spoke with patient who would like to think about options for explant versus leaving in place. Patient instructed to unplug remote monitor and agreeable to returning when return kit available. Confirmed address. Return kit requested. She will call to schedule explant when ready. Carelink and paceart updated. Future remotes cancelled in Epic.

## 2020-12-04 ENCOUNTER — Other Ambulatory Visit: Payer: Self-pay

## 2020-12-04 ENCOUNTER — Ambulatory Visit (INDEPENDENT_AMBULATORY_CARE_PROVIDER_SITE_OTHER): Payer: Medicare Other | Admitting: Physical Medicine and Rehabilitation

## 2020-12-04 ENCOUNTER — Encounter: Payer: Self-pay | Admitting: Physical Medicine and Rehabilitation

## 2020-12-04 ENCOUNTER — Ambulatory Visit: Payer: Self-pay

## 2020-12-04 DIAGNOSIS — M25551 Pain in right hip: Secondary | ICD-10-CM

## 2020-12-04 MED ORDER — BUPIVACAINE HCL 0.25 % IJ SOLN
4.0000 mL | INTRAMUSCULAR | Status: AC | PRN
  Administered 2020-12-04: 4 mL via INTRA_ARTICULAR

## 2020-12-04 MED ORDER — TRIAMCINOLONE ACETONIDE 40 MG/ML IJ SUSP
60.0000 mg | INTRAMUSCULAR | Status: AC | PRN
  Administered 2020-12-04: 60 mg via INTRA_ARTICULAR

## 2020-12-04 NOTE — Patient Instructions (Signed)

## 2020-12-04 NOTE — Progress Notes (Signed)
   Sameka Bagent - 80 y.o. female MRN [id number]  Date of birth: 08-15-1940  Office Visit Note: Visit Date: 12/04/2020 PCP: Ginger Organ., MD Referred by: Ginger Organ., MD  Subjective: Chief Complaint  Patient presents with  . Right Hip - Pain   HPI:  Dalene Robards is a 80 y.o. female who comes in today at the request of Dr. Joni Fears for planned Right anesthetic hip arthrogram with fluoroscopic guidance.  The patient has failed conservative care including home exercise, medications, time and activity modification.  This injection will be diagnostic and hopefully therapeutic.  Please see requesting physician notes for further details and justification.   ROS Otherwise per HPI.  Assessment & Plan: Visit Diagnoses:    ICD-10-CM   1. Pain in right hip  M25.551 XR C-ARM NO REPORT    Plan: No additional findings.   Meds & Orders: No orders of the defined types were placed in this encounter.   Orders Placed This Encounter  Procedures  . Large Joint Inj  . XR C-ARM NO REPORT    Follow-up: Return in about 2 weeks (around 12/18/2020) for Joni Fears, MD.   Procedures: Large Joint Inj: R hip joint on 12/04/2020 10:11 AM Indications: diagnostic evaluation and pain Details: 22 G 3.5 in needle, fluoroscopy-guided anterior approach  Arthrogram: No  Medications: 4 mL bupivacaine 0.25 %; 60 mg triamcinolone acetonide 40 MG/ML Outcome: tolerated well, no immediate complications  There was excellent flow of contrast producing a partial arthrogram of the hip. The patient did have relief of symptoms during the anesthetic phase of the injection. Procedure, treatment alternatives, risks and benefits explained, specific risks discussed. Consent was given by the patient. Immediately prior to procedure a time out was called to verify the correct patient, procedure, equipment, support staff and site/side marked as required. Patient was prepped and draped in the usual sterile  fashion.          Clinical History: No specialty comments available.     Objective:  VS:  HT:    WT:   BMI:     BP:   HR: bpm  TEMP: ( )  RESP:  Physical Exam   Imaging: No results found.

## 2020-12-04 NOTE — Progress Notes (Signed)
Pt state right hip pain. Pt state walking makes the pain worse. Pt state she take over the counter pain meds and sits to rest.  Numeric Pain Rating Scale and Functional Assessment Average Pain 4   In the last MONTH (on 0-10 scale) has pain interfered with the following?  1. General activity like being  able to carry out your everyday physical activities such as walking, climbing stairs, carrying groceries, or moving a chair?  Rating(10)   -Driver, -BT, -Dye Allergies.

## 2020-12-05 DIAGNOSIS — R3914 Feeling of incomplete bladder emptying: Secondary | ICD-10-CM | POA: Diagnosis not present

## 2020-12-12 NOTE — Progress Notes (Signed)
Carelink Summary Report / Loop Recorder 

## 2020-12-14 NOTE — Progress Notes (Signed)
Cardiology Office Note   Date:  12/15/2020   ID:  Sabrina Parks, DOB 1941-01-12, MRN [id number]  PCP:  Ginger Organ., MD  Cardiologist:   Dorris Carnes, MD   F/U of HTN     History of Present Illness: Sabrina Parks is a 80 y.o. female with a history of HTN, HL and palpitations and one episode of global amnesia (2013).  Stress test 2011 negaitve for ischemia.  Echo Nov 2013 mild LVH.  LVEF 65 to 97% with Gr I diastlic dysfunc.   MRA neg  Carotid USN neg  In 2018 EKG showed new  LBBB  Myovue done which was  normal  The pt also had  one episode of syncope  Had ILR and is followed by Beckie Salts    I last saw the pt in Aug 2021   She was seen by Beckie Salts since in Nov 2021  Since seen the pt denies CP   No dizziness   Remains active    No SOB   Occasional palpitations  Short lived  Current Meds  Medication Sig  . acetaminophen (TYLENOL) 500 MG tablet Take 500 mg by mouth every 6 (six) hours as needed for moderate pain or headache.  Marland Kitchen amitriptyline (ELAVIL) 25 MG tablet Take 25 mg by mouth at bedtime.   Marland Kitchen aspirin EC 81 MG tablet Take 81 mg by mouth at bedtime.  Marland Kitchen dexamethasone (DECADRON) 1 MG tablet 2 tablets twice daily for 2 days, one tablet twice daily for 2 days, one tablet daily for 2 days.  . famotidine (PEPCID) 10 MG tablet Take 10 mg by mouth daily as needed for heartburn or indigestion.  Marland Kitchen ibuprofen (ADVIL,MOTRIN) 200 MG tablet Take 200 mg by mouth every 8 (eight) hours as needed for headache or moderate pain.   Marland Kitchen levothyroxine (SYNTHROID, LEVOTHROID) 50 MCG tablet Take 50 mcg by mouth daily before breakfast.  . LORazepam (ATIVAN) 0.5 MG tablet Take 0.5 mg by mouth daily as needed for anxiety.   . methocarbamol (ROBAXIN) 500 MG tablet Take 1 tablet (500 mg total) by mouth every 6 (six) hours as needed for muscle spasms.  Marland Kitchen olmesartan (BENICAR) 40 MG tablet Take 40 mg by mouth at bedtime.   Marland Kitchen PROAIR HFA 108 (90 BASE) MCG/ACT inhaler Inhale 2 puffs into the lungs every 6 (six)  hours as needed for wheezing or shortness of breath.   . rosuvastatin (CRESTOR) 20 MG tablet Take 1 tablet (20 mg total) by mouth daily.  . traMADol (ULTRAM) 50 MG tablet Take 1 tablet (50 mg total) by mouth every 6 (six) hours as needed for moderate pain.  Marland Kitchen triamcinolone (NASACORT) 55 MCG/ACT AERO nasal inhaler Place 1 spray into the nose daily as needed (for allergies).  . triamterene-hydrochlorothiazide (MAXZIDE-25) 37.5-25 MG tablet Take 0.5 tablets by mouth daily.     Allergies:   Atorvastatin, Codeine, Morphine, Morphine and related, Other, and Sulfa antibiotics   Past Medical History:  Diagnosis Date  . Alopecia   . Arthritis   . Complication of anesthesia   . DJD (degenerative joint disease), cervical 5/02   Elsner  . GERD (gastroesophageal reflux disease)   . Headache   . HSV-1 infection   . Hyperlipidemia   . Hypertension   . Hypothyroidism   . Lichen sclerosus 02/63/7858   biopsy proven  . Liver hemangioma 2011   Dr. Earlean Shawl  . PONV (postoperative nausea and vomiting)   . Raynaud's disease   . Thyroiditis   .  Transient global amnesia    not fully diag    Past Surgical History:  Procedure Laterality Date  . APPENDECTOMY  1956  . CARDIOVASCULAR STRESS TEST  10/29/2006   EF 78%.   Marland Kitchen CATARACT EXTRACTION Bilateral   . CYSTOCELE REPAIR N/A 12/29/2018   Procedure: exam under anesthesia;  Surgeon: Bjorn Loser, MD;  Location: [facility];  Service: Urology;  Laterality: N/A;  . LOOP RECORDER INSERTION N/A 05/01/2017   Procedure: LOOP RECORDER INSERTION;  Surgeon: Evans Lance, MD;  Location: [facility];  Service: Cardiovascular;  Laterality: N/A;  . LUMBAR LAMINECTOMY/DECOMPRESSION MICRODISCECTOMY N/A 09/25/2020   Procedure: Lumbar two-three Lumbar three-four Lumbar four-five  Laminectomy and foraminotomy;  Surgeon: Kristeen Miss, MD;  Location: [facility];  Service: Neurosurgery;  Laterality: N/A;  . ROBOTIC ASSISTED LAPAROSCOPIC SACROCOLPOPEXY N/A 02/25/2019    Procedure: XI ROBOTIC ASSISTED LAPAROSCOPIC SACROCOLPOPEXY;  Surgeon: [name], MD;  Location: [facility];  Service: Urology;  Laterality: N/A;  . ROTATOR CUFF REPAIR Bilateral   . SHOULDER SURGERY    . TONSILLECTOMY AND ADENOIDECTOMY  1957  . TOTAL ABDOMINAL HYSTERECTOMY W/ BILATERAL SALPINGOOPHORECTOMY  7/90  . US ECHOCARDIOGRAPHY  10/11/2009   EF 55-60%     Social History:  The patient  reports that she quit smoking about 50 years ago. Her smoking use included cigarettes. She quit after 10.00 years of use. She has never used smokeless tobacco. She reports current alcohol use. She reports that she does not use drugs.   Family History:  The patient's family history includes Cancer in her father; Colon cancer in her father; Stroke in her mother.    ROS:  Please see the history of present illness. All other systems are reviewed and  Negative to the above problem except as noted.    PHYSICAL EXAM: VS:  BP (!) 102/48   Pulse 80   Ht 5\' 3"  (1.6 m)   Wt 136 lb 12.8 oz (62.1 kg)   LMP 07/29/1990   SpO2 97%   BMI 24.23 kg/m    GEN: Well nourished, well developed, in no acute distress  HEENT: normal  Neck: JVP normal  No carotid bruits, Cardiac: RRR; no murmurs;  No LE edema  Respiratory:  clear to auscultation bilaterally GI: soft, nontender, nondistended, + BS  No hepatomegaly  MS: no deformity Moving all extremities   Skin: warm and dry, no rash  Pt wearing hat had Bx on scalp Neuro:  Strength and sensation are intact Psych: euthymic mood, full affect   EKG:  EKG is not done  Lipid Panel    Component Value Date/Time   CHOL 224 (H) 03/27/2020 1158   TRIG 152 (H) 03/27/2020 1158   HDL 91 03/27/2020 1158   CHOLHDL 2.5 03/27/2020 1158   LDLCALC 107 (H) 03/27/2020 1158      Wt Readings from Last 3 Encounters:  12/15/20 136 lb 12.8 oz (62.1 kg)  11/22/20 135 lb (61.2 kg)  09/25/20 135 lb (61.2 kg)      ASSESSMENT AND PLAN:  1  HTN   BP is OK   She denies  dizziness  2  Dizziness/ syncope   ILR showed no arrhythmia   The battery is now dead    3  Palpitations  / heart racing     Denies signficant episodes    4   LBBB  Old    5  HL  Last LDL 105  HDL 63     F/U in  clinicin about 9 months   Current medicines are reviewed at length with the patient today.  The patient does not have concerns regarding medicines.  Signed, Dorris Carnes, MD  12/15/2020 10:05 PM    [facility] [place], [place], [place]  [address] Phone: [phone]; Fax: [phone]

## 2020-12-15 ENCOUNTER — Ambulatory Visit (INDEPENDENT_AMBULATORY_CARE_PROVIDER_SITE_OTHER): Payer: Medicare Other | Admitting: Internal Medicine

## 2020-12-15 ENCOUNTER — Encounter: Payer: Self-pay | Admitting: Internal Medicine

## 2020-12-15 ENCOUNTER — Other Ambulatory Visit: Payer: Self-pay

## 2020-12-15 VITALS — BP 102/48 | HR 80 | Ht 63.0 in | Wt 136.8 lb

## 2020-12-15 DIAGNOSIS — I10 Essential (primary) hypertension: Secondary | ICD-10-CM | POA: Diagnosis not present

## 2020-12-15 NOTE — Patient Instructions (Signed)
Medication Instructions:  No changes *If you need a refill on your cardiac medications before your next appointment, please call your pharmacy*   Lab Work: none If you have labs (blood work) drawn today and your tests are completely normal, you will receive your results only by: Marland Kitchen MyChart Message (if you have MyChart) OR . A paper copy in the mail If you have any lab test that is abnormal or we need to change your treatment, we will call you to review the results.   Testing/Procedures: none   Follow-Up: We will call you to schedule follow up.

## 2020-12-20 DIAGNOSIS — D1801 Hemangioma of skin and subcutaneous tissue: Secondary | ICD-10-CM | POA: Diagnosis not present

## 2020-12-20 DIAGNOSIS — L570 Actinic keratosis: Secondary | ICD-10-CM | POA: Diagnosis not present

## 2020-12-20 DIAGNOSIS — Z85828 Personal history of other malignant neoplasm of skin: Secondary | ICD-10-CM | POA: Diagnosis not present

## 2020-12-20 DIAGNOSIS — L821 Other seborrheic keratosis: Secondary | ICD-10-CM | POA: Diagnosis not present

## 2020-12-20 DIAGNOSIS — D692 Other nonthrombocytopenic purpura: Secondary | ICD-10-CM | POA: Diagnosis not present

## 2020-12-20 DIAGNOSIS — D225 Melanocytic nevi of trunk: Secondary | ICD-10-CM | POA: Diagnosis not present

## 2020-12-26 ENCOUNTER — Ambulatory Visit: Payer: Medicare Other | Admitting: Orthopaedic Surgery

## 2021-01-03 ENCOUNTER — Ambulatory Visit (INDEPENDENT_AMBULATORY_CARE_PROVIDER_SITE_OTHER): Payer: Medicare Other | Admitting: Orthopaedic Surgery

## 2021-01-03 ENCOUNTER — Other Ambulatory Visit: Payer: Self-pay

## 2021-01-03 ENCOUNTER — Encounter: Payer: Self-pay | Admitting: Orthopaedic Surgery

## 2021-01-03 VITALS — Ht 63.75 in | Wt 136.0 lb

## 2021-01-03 DIAGNOSIS — M25551 Pain in right hip: Secondary | ICD-10-CM

## 2021-01-03 NOTE — Progress Notes (Signed)
Office Visit Note   Patient: Sabrina Parks           Date of Birth: Jun 23, 1941           MRN: [id number] Visit Date: 01/03/2021              Requested by: Ginger Organ., MD [address] [place],  [place] [address] PCP: Ginger Organ., MD   Assessment & Plan: Visit Diagnoses:  1. Pain in right hip     Plan: Morgan had 2 days of relief of her right hip and groin pain after the intra-articular cortisone injection by Dr. Ernestina Patches.  She did not have much loss of motion of her hip but still has a little groin discomfort.  There was calcification along the capsule and possibly a very small inferior humeral head spur.  I like to obtain an MRI scan of her right hip and hemipelvis to be sure that there is no other pathology or even more arthritic change and received by plain film.  She really is having difficult time  Follow-Up Instructions: Return After MRI scan right hip.   Orders:  Orders Placed This Encounter  Procedures  . MR Hip Right w/o contrast   No orders of the defined types were placed in this encounter.     Procedures: No procedures performed   Clinical Data: No additional findings.   Subjective: Chief Complaint  Patient presents with  . Right Hip - Pain  Patient presents for follow up right hip pain. She is status post right hip injection with Dr. Ernestina Patches on 12/04/2020. The injection only helped x 2 days. She states that she tried to walk as she normally does and the pain was there. She does feel that the pain is just as bad as before at times. She is taking tylenol with relief.  No significant history of back discomfort after her back surgery by the neurosurgeons  HPI  Review of Systems   Objective: Vital Signs: Ht 5' 3.75" (1.619 m)   Wt 136 lb (61.7 kg)   LMP 07/29/1990   BMI 23.53 kg/m   Physical Exam Constitutional:      Appearance: She is well-developed.  Eyes:     Pupils: Pupils are equal, round, and reactive to light.  Pulmonary:      Effort: Pulmonary effort is normal.  Skin:    General: Skin is warm and dry.  Neurological:     Mental Status: She is alert and oriented to person, place, and time.  Psychiatric:        Behavior: Behavior normal.     Ortho Exam awake alert and oriented x3.  Comfortable sitting.  Did have a little discomfort with internal and external rotation on extreme of right hip motion but no thigh pain.  No pain over the greater trochanter.  Straight leg raise negative.  No percussible back pain  Specialty Comments:  No specialty comments available.  Imaging: No results found.   PMFS History: Patient Active Problem List   Diagnosis Date Noted  . Unilateral primary osteoarthritis, right hip 11/22/2020  . Lumbar stenosis with neurogenic claudication 09/25/2020  . Trochanteric bursitis, right hip 04/18/2020  . Unilateral primary osteoarthritis, right knee 03/01/2020  . Pain due to onychomycosis of toenails of both feet 07/07/2019  . Syncope 06/01/2019  . Left bundle branch block 06/01/2019  . Pelvic prolapse 02/25/2019  . Pain in right hip 02/23/2019  . 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  Lichen sclerosus et atrophicus 06/14/2016  . Hypothyroidism 06/14/2016  . Hypertension   . Hyperlipidemia   . TIA (transient ischemic attack) 06/12/2012  . Alopecia 08/01/2011   Past Medical History:  Diagnosis Date  . Alopecia   . Arthritis   . Complication of anesthesia   . DJD (degenerative joint disease), cervical 5/02   [facility]lsner  . GERD (gastroesophageal reflux disease)   . Headache   . HSV-1 infection   . Hyperlipidemia   . Hypertension   . Hypothyroidism   . Lichen sclerosus 29/51/8841   biopsy proven  . Liver hemangioma 2011   Dr. Earlean Shawl  . PONV (postoperative nausea and vomiting)   . Raynaud's disease   . Thyroiditis   . Transient global amnesia    not fully diag    Family History  Problem Relation Age of Onset  . Stroke Mother   . Cancer Father   . Colon cancer  Father        65's    Past Surgical History:  Procedure Laterality Date  . APPENDECTOMY  1956  . CARDIOVASCULAR STRESS TEST  10/29/2006   EF 78%.   Marland Kitchen CATARACT EXTRACTION Bilateral   . CYSTOCELE REPAIR N/A 12/29/2018   Procedure: exam under anesthesia;  Surgeon: Bjorn Loser, MD;  [facility]: [facility];  Service: Urology;  Laterality: N/A;  . LOOP RECORDER INSERTION N/A 05/01/2017   Procedure: LOOP RECORDER INSERTION;  Surgeon: Evans Lance, MD;  Location: [facility];  Service: Cardiovascular;  Laterality: N/A;  . LUMBAR LAMINECTOMY/DECOMPRESSION MICRODISCECTOMY N/A 09/25/2020   Procedure: Lumbar two-three Lumbar three-four Lumbar four-five  Laminectomy and foraminotomy;  Surgeon: Kristeen Miss, MD;  Location: [facility];  Service: Neurosurgery;  Laterality: N/A;  . ROBOTIC ASSISTED LAPAROSCOPIC SACROCOLPOPEXY N/A 02/25/2019   Procedure: XI ROBOTIC ASSISTED LAPAROSCOPIC SACROCOLPOPEXY;  Surgeon: [name], MD;  [facility];  Service: Urology;  Laterality: N/A;  . ROTATOR CUFF REPAIR Bilateral   . SHOULDER SURGERY    . TONSILLECTOMY AND ADENOIDECTOMY  1957  . TOTAL ABDOMINAL HYSTERECTOMY W/ BILATERAL SALPINGOOPHORECTOMY  7/90  . US ECHOCARDIOGRAPHY  10/11/2009   EF 55-60%   Social History   Occupational History  . Not on file  Tobacco Use  . Smoking status: Former Smoker    Years: 10.00    Types: Cigarettes    Quit date: 01/01/1970    Years since quitting: 51.0  . Smokeless tobacco: Never Used  Vaping Use  . Vaping Use: Never used  Substance and Sexual Activity  . Alcohol use: Yes    Comment: 1-2 glasses of wine   . Drug use: No  . Sexual activity: Yes    Partners: Male    Comment: hysterectomy

## 2021-01-14 ENCOUNTER — Ambulatory Visit
Admission: RE | Admit: 2021-01-14 | Discharge: 2021-01-14 | Disposition: A | Discharge status: Home / Self Care | Payer: Medicare Other | Source: Ambulatory Visit | Attending: Orthopaedic Surgery | Admitting: Orthopaedic Surgery

## 2021-01-14 DIAGNOSIS — M25551 Pain in right hip: Secondary | ICD-10-CM

## 2021-01-25 ENCOUNTER — Other Ambulatory Visit: Payer: Self-pay

## 2021-01-25 ENCOUNTER — Ambulatory Visit (INDEPENDENT_AMBULATORY_CARE_PROVIDER_SITE_OTHER): Payer: Medicare Other | Admitting: Orthopaedic Surgery

## 2021-01-25 ENCOUNTER — Encounter: Payer: Self-pay | Admitting: Orthopaedic Surgery

## 2021-01-25 VITALS — Ht 63.75 in | Wt 136.0 lb

## 2021-01-25 DIAGNOSIS — M48062 Spinal stenosis, lumbar region with neurogenic claudication: Secondary | ICD-10-CM

## 2021-01-25 DIAGNOSIS — M1611 Unilateral primary osteoarthritis, right hip: Secondary | ICD-10-CM | POA: Diagnosis not present

## 2021-01-25 MED ORDER — METHYLPREDNISOLONE ACETATE 40 MG/ML IJ SUSP
80.0000 mg | INTRAMUSCULAR | Status: AC | PRN
  Administered 2021-01-25: 80 mg via INTRA_ARTICULAR

## 2021-01-25 MED ORDER — BUPIVACAINE HCL 0.5 % IJ SOLN
2.0000 mL | INTRAMUSCULAR | Status: AC | PRN
  Administered 2021-01-25: 2 mL via INTRA_ARTICULAR

## 2021-01-25 MED ORDER — LIDOCAINE HCL 1 % IJ SOLN
2.0000 mL | INTRAMUSCULAR | Status: AC | PRN
  Administered 2021-01-25: 2 mL

## 2021-01-25 NOTE — Progress Notes (Signed)
Office Visit Note   Patient: Sabrina Parks           Date of Birth: 22-Jun-1941           MRN: [id number] Visit Date: 01/25/2021              Requested by: Ginger Organ., MD [address] [place],  [place] [address] PCP: Ginger Organ., MD   Assessment & Plan: Visit Diagnoses:  1. Unilateral primary osteoarthritis, right hip   2. Lumbar stenosis with neurogenic claudication     Plan: Zyriah has had chronic pain localized along the lateral aspect of her right hip.  She initially was having pain in that same location with referred discomfort into her thigh but after she had decompressive laminectomy by Dr. Ellene Route now is only having the pain along the lateral aspect of her hip and not referred distally.  She is not had any groin or thigh pain.  She has had several injections over the greater trochanter given her only temporary relief of her pain.  There were some degenerative changes noted on x-ray in the right hip and I had Dr. Ernestina Patches inject that up and she only had about 2 days of relief.  Therefore, I obtained an MRI scan of her right hemipelvis.  There was moderate degenerative changes of the hip joint and a very partial tear of the gluteus medius tendon distally.  The question is where is the origin of her pain.  I am not so sure that based on her limited response to the intra-articular cortisone injection that her problem is referable to the hip.  Seems a little unlikely based on the gluteus medius pathology that would be causing all of her pain.  This certainly is a possibility she is having some residual discomfort from her spine.  The longer she stands of the further she walks she starts having a bit more discomfort.  I have asked her to check back with Dr. Ellene Route.  She was locally tender over the lateral aspect of her hip just inferior to the greater trochanter and so I have injected that with a cortisone we will see if that makes a difference  Follow-Up Instructions: Return if  symptoms worsen or fail to improve.   Orders:  No orders of the defined types were placed in this encounter.  No orders of the defined types were placed in this encounter.     Procedures: Large Joint Inj: R greater trochanter on 01/25/2021 3:28 PM Indications: pain and diagnostic evaluation Details: 25 G 1.5 in needle  Arthrogram: No  Medications: 2 mL lidocaine 1 %; 2 mL bupivacaine 0.5 %; 80 mg methylPREDNISolone acetate 40 MG/ML Procedure, treatment alternatives, risks and benefits explained, specific risks discussed. Consent was given by the patient. Immediately prior to procedure a time out was called to verify the correct patient, procedure, equipment, support staff and site/side marked as required. Patient was prepped and draped in the usual sterile fashion.      Clinical Data: No additional findings.   Subjective: Chief Complaint  Patient presents with   Right Hip - Pain, Follow-up    MRI review  Patient presents today for follow up on her right hip. She had an MRI and is here today to discuss those results.  HPI  Review of Systems   Objective: Vital Signs: Ht 5' 3.75" (1.619 m)   Wt 136 lb (61.7 kg)   LMP 07/29/1990   BMI 23.53 kg/m  Physical Exam Constitutional:      Appearance: She is well-developed.  Eyes:     Pupils: Pupils are equal, round, and reactive to light.  Pulmonary:     Effort: Pulmonary effort is normal.  Skin:    General: Skin is warm and dry.  Neurological:     Mental Status: She is alert and oriented to person, place, and time.  Psychiatric:        Behavior: Behavior normal.    Ortho Exam awake alert and oriented x3.  Comfortable sitting.  Walks without a limp.  Does have an area of tenderness just inferior to the greater trochanter over the left hip with pain but no skin changes or masses.  Absolutely no pain with internal and external rotation of her right hip or in flexion and extension.  Straight leg raise  negative.  Specialty Comments:  No specialty comments available.  Imaging: No results found.   PMFS History: Patient Active Problem List   Diagnosis Date Noted   Unilateral primary osteoarthritis, right hip 11/22/2020   Lumbar stenosis with neurogenic claudication 09/25/2020   Trochanteric bursitis, right hip 04/18/2020   Unilateral primary osteoarthritis, right knee 03/01/2020   Pain due to onychomycosis of toenails of both feet 07/07/2019   Syncope 06/01/2019   Left bundle branch block 06/01/2019   Pelvic prolapse 02/25/2019   Pain in right hip 02/23/2019   Benign neoplasm of parotid gland 44/07/270   Lichen sclerosus et atrophicus 06/14/2016   Hypothyroidism 06/14/2016   Hypertension    Hyperlipidemia    TIA (transient ischemic attack) 06/12/2012   Alopecia 08/01/2011   Past Medical History:  Diagnosis Date   Alopecia    Arthritis    Complication of anesthesia    DJD (degenerative joint disease), cervical 5/02   Elsner   GERD (gastroesophageal reflux disease)    Headache    HSV-1 infection    Hyperlipidemia    Hypertension    Hypothyroidism    Lichen sclerosus 53/66/4403   biopsy proven   Liver hemangioma 2011   Dr. Earlean Shawl   PONV (postoperative nausea and vomiting)    Raynaud's disease    Thyroiditis    Transient global amnesia    not fully diag    Family History  Problem Relation Age of Onset   Stroke Mother    Cancer Father    Colon cancer Father        74's    Past Surgical History:  Procedure Laterality Date   APPENDECTOMY  1956   CARDIOVASCULAR STRESS TEST  10/29/2006   EF 78%.    CATARACT EXTRACTION Bilateral    CYSTOCELE REPAIR N/A 12/29/2018   Procedure: exam under anesthesia;  Surgeon: Bjorn Loser, MD;  [facility];  Service: Urology;  Laterality: N/A;   LOOP RECORDER INSERTION N/A 05/01/2017   Procedure: LOOP RECORDER INSERTION;  Surgeon: Evans Lance, MD;  Location: [facility];  Service: Cardiovascular;  Laterality:  N/A;   LUMBAR LAMINECTOMY/DECOMPRESSION MICRODISCECTOMY N/A 09/25/2020   Procedure: Lumbar two-three Lumbar three-four Lumbar four-five  Laminectomy and foraminotomy;  Surgeon: Kristeen Miss, MD;  Location: [facility];  Service: Neurosurgery;  Laterality: N/A;   ROBOTIC ASSISTED LAPAROSCOPIC SACROCOLPOPEXY N/A 02/25/2019   Procedure: XI ROBOTIC ASSISTED LAPAROSCOPIC SACROCOLPOPEXY;  Surgeon: [name], MD;  [facility];  Service: Urology;  Laterality: N/A;   ROTATOR CUFF REPAIR Bilateral    SHOULDER SURGERY     TONSILLECTOMY AND ADENOIDECTOMY  1957   TOTAL ABDOMINAL  HYSTERECTOMY W/ BILATERAL SALPINGOOPHORECTOMY  7/90   US ECHOCARDIOGRAPHY  10/11/2009   EF 55-60%   Social History   Occupational History   Not on file  Tobacco Use   Smoking status: Former    Years: 10.00    Pack years: 0.00    Types: Cigarettes    Quit date: 01/01/1970    Years since quitting: 51.1   Smokeless tobacco: Never  Vaping Use   Vaping Use: Never used  Substance and Sexual Activity   Alcohol use: Yes    Comment: 1-2 glasses of wine    Drug use: No   Sexual activity: Yes    Partners: Male    Comment: hysterectomy

## 2021-02-01 DIAGNOSIS — E785 Hyperlipidemia, unspecified: Secondary | ICD-10-CM | POA: Diagnosis not present

## 2021-02-01 DIAGNOSIS — E039 Hypothyroidism, unspecified: Secondary | ICD-10-CM | POA: Diagnosis not present

## 2021-02-13 DIAGNOSIS — M542 Cervicalgia: Secondary | ICD-10-CM | POA: Diagnosis not present

## 2021-02-13 DIAGNOSIS — H353230 Exudative age-related macular degeneration, bilateral, stage unspecified: Secondary | ICD-10-CM | POA: Diagnosis not present

## 2021-02-13 DIAGNOSIS — G4452 New daily persistent headache (NDPH): Secondary | ICD-10-CM | POA: Diagnosis not present

## 2021-02-13 DIAGNOSIS — Z1231 Encounter for screening mammogram for malignant neoplasm of breast: Secondary | ICD-10-CM | POA: Diagnosis not present

## 2021-02-17 ENCOUNTER — Other Ambulatory Visit: Payer: Self-pay | Admitting: Internal Medicine

## 2021-02-17 DIAGNOSIS — G4452 New daily persistent headache (NDPH): Secondary | ICD-10-CM

## 2021-02-19 DIAGNOSIS — H353211 Exudative age-related macular degeneration, right eye, with active choroidal neovascularization: Secondary | ICD-10-CM | POA: Diagnosis not present

## 2021-03-05 DIAGNOSIS — H35373 Puckering of macula, bilateral: Secondary | ICD-10-CM | POA: Diagnosis not present

## 2021-03-05 DIAGNOSIS — Z961 Presence of intraocular lens: Secondary | ICD-10-CM | POA: Diagnosis not present

## 2021-03-05 DIAGNOSIS — H35363 Drusen (degenerative) of macula, bilateral: Secondary | ICD-10-CM | POA: Diagnosis not present

## 2021-03-05 DIAGNOSIS — H35453 Secondary pigmentary degeneration, bilateral: Secondary | ICD-10-CM | POA: Diagnosis not present

## 2021-03-05 DIAGNOSIS — H353122 Nonexudative age-related macular degeneration, left eye, intermediate dry stage: Secondary | ICD-10-CM | POA: Diagnosis not present

## 2021-03-05 DIAGNOSIS — H33321 Round hole, right eye: Secondary | ICD-10-CM | POA: Diagnosis not present

## 2021-03-05 DIAGNOSIS — H43391 Other vitreous opacities, right eye: Secondary | ICD-10-CM | POA: Diagnosis not present

## 2021-03-05 DIAGNOSIS — H353211 Exudative age-related macular degeneration, right eye, with active choroidal neovascularization: Secondary | ICD-10-CM | POA: Diagnosis not present

## 2021-03-05 DIAGNOSIS — H43811 Vitreous degeneration, right eye: Secondary | ICD-10-CM | POA: Diagnosis not present

## 2021-03-07 ENCOUNTER — Ambulatory Visit
Admission: RE | Admit: 2021-03-07 | Discharge: 2021-03-07 | Disposition: A | Discharge status: Home / Self Care | Payer: Medicare Other | Source: Ambulatory Visit | Attending: Internal Medicine | Admitting: Internal Medicine

## 2021-03-07 DIAGNOSIS — G4452 New daily persistent headache (NDPH): Secondary | ICD-10-CM

## 2021-03-07 DIAGNOSIS — D320 Benign neoplasm of cerebral meninges: Secondary | ICD-10-CM | POA: Diagnosis not present

## 2021-03-07 MED ORDER — GADOBENATE DIMEGLUMINE 529 MG/ML IV SOLN
13.0000 mL | Freq: Once | INTRAVENOUS | Status: AC | PRN
  Administered 2021-03-07: 13 mL via INTRAVENOUS

## 2021-03-13 ENCOUNTER — Ambulatory Visit: Payer: Medicare Other | Admitting: Internal Medicine

## 2021-03-13 DIAGNOSIS — N3000 Acute cystitis without hematuria: Secondary | ICD-10-CM | POA: Diagnosis not present

## 2021-03-14 DIAGNOSIS — M48062 Spinal stenosis, lumbar region with neurogenic claudication: Secondary | ICD-10-CM | POA: Diagnosis not present

## 2021-03-15 ENCOUNTER — Other Ambulatory Visit: Payer: Self-pay | Admitting: Neurological Surgery

## 2021-03-15 DIAGNOSIS — M48062 Spinal stenosis, lumbar region with neurogenic claudication: Secondary | ICD-10-CM

## 2021-03-19 ENCOUNTER — Other Ambulatory Visit (HOSPITAL_COMMUNITY): Payer: Self-pay | Admitting: Internal Medicine

## 2021-03-19 DIAGNOSIS — R0609 Other forms of dyspnea: Secondary | ICD-10-CM | POA: Diagnosis not present

## 2021-03-19 DIAGNOSIS — N3000 Acute cystitis without hematuria: Secondary | ICD-10-CM | POA: Diagnosis not present

## 2021-03-19 DIAGNOSIS — R748 Abnormal levels of other serum enzymes: Secondary | ICD-10-CM

## 2021-03-19 DIAGNOSIS — R5383 Other fatigue: Secondary | ICD-10-CM | POA: Diagnosis not present

## 2021-03-19 DIAGNOSIS — E871 Hypo-osmolality and hyponatremia: Secondary | ICD-10-CM | POA: Diagnosis not present

## 2021-03-19 DIAGNOSIS — R7989 Other specified abnormal findings of blood chemistry: Secondary | ICD-10-CM | POA: Diagnosis not present

## 2021-03-19 DIAGNOSIS — Z1152 Encounter for screening for COVID-19: Secondary | ICD-10-CM | POA: Diagnosis not present

## 2021-03-19 DIAGNOSIS — D649 Anemia, unspecified: Secondary | ICD-10-CM | POA: Diagnosis not present

## 2021-03-20 ENCOUNTER — Ambulatory Visit (HOSPITAL_COMMUNITY)
Admission: RE | Admit: 2021-03-20 | Discharge: 2021-03-20 | Disposition: A | Discharge status: Home / Self Care | Payer: Medicare Other | Source: Ambulatory Visit | Attending: Internal Medicine | Admitting: Internal Medicine

## 2021-03-20 ENCOUNTER — Other Ambulatory Visit: Payer: Self-pay

## 2021-03-20 DIAGNOSIS — R748 Abnormal levels of other serum enzymes: Secondary | ICD-10-CM | POA: Diagnosis not present

## 2021-03-20 DIAGNOSIS — K828 Other specified diseases of gallbladder: Secondary | ICD-10-CM | POA: Diagnosis not present

## 2021-03-20 DIAGNOSIS — K7689 Other specified diseases of liver: Secondary | ICD-10-CM | POA: Diagnosis not present

## 2021-03-23 DIAGNOSIS — N390 Urinary tract infection, site not specified: Secondary | ICD-10-CM | POA: Diagnosis not present

## 2021-03-23 DIAGNOSIS — E039 Hypothyroidism, unspecified: Secondary | ICD-10-CM | POA: Diagnosis not present

## 2021-03-23 DIAGNOSIS — R300 Dysuria: Secondary | ICD-10-CM | POA: Diagnosis not present

## 2021-03-23 DIAGNOSIS — R5383 Other fatigue: Secondary | ICD-10-CM | POA: Diagnosis not present

## 2021-03-27 ENCOUNTER — Other Ambulatory Visit: Payer: Self-pay | Admitting: Internal Medicine

## 2021-03-27 ENCOUNTER — Ambulatory Visit
Admission: RE | Admit: 2021-03-27 | Discharge: 2021-03-27 | Disposition: A | Discharge status: Home / Self Care | Payer: Medicare Other | Source: Ambulatory Visit | Attending: Internal Medicine | Admitting: Internal Medicine

## 2021-03-27 DIAGNOSIS — M47817 Spondylosis without myelopathy or radiculopathy, lumbosacral region: Secondary | ICD-10-CM | POA: Diagnosis not present

## 2021-03-27 DIAGNOSIS — K449 Diaphragmatic hernia without obstruction or gangrene: Secondary | ICD-10-CM | POA: Diagnosis not present

## 2021-03-27 DIAGNOSIS — M160 Bilateral primary osteoarthritis of hip: Secondary | ICD-10-CM | POA: Diagnosis not present

## 2021-03-27 DIAGNOSIS — K8020 Calculus of gallbladder without cholecystitis without obstruction: Secondary | ICD-10-CM | POA: Diagnosis not present

## 2021-03-27 DIAGNOSIS — R102 Pelvic and perineal pain: Secondary | ICD-10-CM

## 2021-03-27 MED ORDER — IOPAMIDOL (ISOVUE-300) INJECTION 61%
100.0000 mL | Freq: Once | INTRAVENOUS | Status: AC | PRN
  Administered 2021-03-27: 100 mL via INTRAVENOUS

## 2021-03-28 DIAGNOSIS — E785 Hyperlipidemia, unspecified: Secondary | ICD-10-CM | POA: Diagnosis not present

## 2021-03-30 ENCOUNTER — Ambulatory Visit
Admission: RE | Admit: 2021-03-30 | Discharge: 2021-03-30 | Disposition: A | Discharge status: Home / Self Care | Payer: Medicare Other | Source: Ambulatory Visit | Attending: Neurological Surgery | Admitting: Neurological Surgery

## 2021-03-30 DIAGNOSIS — M48062 Spinal stenosis, lumbar region with neurogenic claudication: Secondary | ICD-10-CM

## 2021-03-30 DIAGNOSIS — M5127 Other intervertebral disc displacement, lumbosacral region: Secondary | ICD-10-CM | POA: Diagnosis not present

## 2021-03-30 DIAGNOSIS — M48061 Spinal stenosis, lumbar region without neurogenic claudication: Secondary | ICD-10-CM | POA: Diagnosis not present

## 2021-03-30 MED ORDER — GADOBENATE DIMEGLUMINE 529 MG/ML IV SOLN
12.0000 mL | Freq: Once | INTRAVENOUS | Status: AC | PRN
  Administered 2021-03-30: 12 mL via INTRAVENOUS

## 2021-04-05 ENCOUNTER — Telehealth (HOSPITAL_BASED_OUTPATIENT_CLINIC_OR_DEPARTMENT_OTHER): Payer: Medicare Other | Admitting: Obstetrics & Gynecology

## 2021-04-05 NOTE — Telephone Encounter (Signed)
Called patient and left message to call the office to set up her appointment with [facility].

## 2021-04-11 DIAGNOSIS — M419 Scoliosis, unspecified: Secondary | ICD-10-CM | POA: Diagnosis not present

## 2021-04-11 DIAGNOSIS — R030 Elevated blood-pressure reading, without diagnosis of hypertension: Secondary | ICD-10-CM | POA: Diagnosis not present

## 2021-04-12 DIAGNOSIS — H903 Sensorineural hearing loss, bilateral: Secondary | ICD-10-CM | POA: Diagnosis not present

## 2021-04-27 ENCOUNTER — Other Ambulatory Visit: Payer: Self-pay | Admitting: Internal Medicine

## 2021-04-30 DIAGNOSIS — M545 Low back pain, unspecified: Secondary | ICD-10-CM | POA: Diagnosis not present

## 2021-04-30 DIAGNOSIS — M48062 Spinal stenosis, lumbar region with neurogenic claudication: Secondary | ICD-10-CM | POA: Diagnosis not present

## 2021-04-30 DIAGNOSIS — M4156 Other secondary scoliosis, lumbar region: Secondary | ICD-10-CM | POA: Diagnosis not present

## 2021-04-30 DIAGNOSIS — M25551 Pain in right hip: Secondary | ICD-10-CM | POA: Diagnosis not present

## 2021-04-30 DIAGNOSIS — G894 Chronic pain syndrome: Secondary | ICD-10-CM | POA: Diagnosis not present

## 2021-04-30 DIAGNOSIS — M5116 Intervertebral disc disorders with radiculopathy, lumbar region: Secondary | ICD-10-CM | POA: Diagnosis not present

## 2021-04-30 DIAGNOSIS — M48061 Spinal stenosis, lumbar region without neurogenic claudication: Secondary | ICD-10-CM | POA: Diagnosis not present

## 2021-05-03 DIAGNOSIS — Z23 Encounter for immunization: Secondary | ICD-10-CM | POA: Diagnosis not present

## 2021-05-12 ENCOUNTER — Encounter (HOSPITAL_COMMUNITY): Payer: Self-pay

## 2021-05-12 ENCOUNTER — Emergency Department (HOSPITAL_COMMUNITY): Payer: Medicare Other

## 2021-05-12 ENCOUNTER — Emergency Department (HOSPITAL_COMMUNITY)
Admission: EM | Admit: 2021-05-12 | Discharge: 2021-05-12 | Disposition: A | Discharge status: Home / Self Care | Payer: Medicare Other | Attending: Emergency Medicine | Admitting: Emergency Medicine

## 2021-05-12 DIAGNOSIS — K8020 Calculus of gallbladder without cholecystitis without obstruction: Secondary | ICD-10-CM | POA: Insufficient documentation

## 2021-05-12 DIAGNOSIS — K219 Gastro-esophageal reflux disease without esophagitis: Secondary | ICD-10-CM | POA: Insufficient documentation

## 2021-05-12 DIAGNOSIS — K8050 Calculus of bile duct without cholangitis or cholecystitis without obstruction: Secondary | ICD-10-CM | POA: Insufficient documentation

## 2021-05-12 DIAGNOSIS — I10 Essential (primary) hypertension: Secondary | ICD-10-CM | POA: Insufficient documentation

## 2021-05-12 DIAGNOSIS — Z8673 Personal history of transient ischemic attack (TIA), and cerebral infarction without residual deficits: Secondary | ICD-10-CM | POA: Diagnosis not present

## 2021-05-12 DIAGNOSIS — Z79899 Other long term (current) drug therapy: Secondary | ICD-10-CM | POA: Insufficient documentation

## 2021-05-12 DIAGNOSIS — E039 Hypothyroidism, unspecified: Secondary | ICD-10-CM | POA: Insufficient documentation

## 2021-05-12 DIAGNOSIS — R1011 Right upper quadrant pain: Secondary | ICD-10-CM | POA: Diagnosis present

## 2021-05-12 DIAGNOSIS — D72829 Elevated white blood cell count, unspecified: Secondary | ICD-10-CM | POA: Diagnosis not present

## 2021-05-12 DIAGNOSIS — Z7982 Long term (current) use of aspirin: Secondary | ICD-10-CM | POA: Insufficient documentation

## 2021-05-12 DIAGNOSIS — Z87891 Personal history of nicotine dependence: Secondary | ICD-10-CM | POA: Diagnosis not present

## 2021-05-12 DIAGNOSIS — K769 Liver disease, unspecified: Secondary | ICD-10-CM | POA: Diagnosis not present

## 2021-05-12 DIAGNOSIS — R1084 Generalized abdominal pain: Secondary | ICD-10-CM | POA: Diagnosis not present

## 2021-05-12 DIAGNOSIS — K5730 Diverticulosis of large intestine without perforation or abscess without bleeding: Secondary | ICD-10-CM | POA: Diagnosis not present

## 2021-05-12 DIAGNOSIS — K8070 Calculus of gallbladder and bile duct without cholecystitis without obstruction: Secondary | ICD-10-CM | POA: Diagnosis not present

## 2021-05-12 DIAGNOSIS — M160 Bilateral primary osteoarthritis of hip: Secondary | ICD-10-CM | POA: Diagnosis not present

## 2021-05-12 LAB — CBC WITH DIFFERENTIAL/PLATELET
Abs Immature Granulocytes: 0.08 10*3/uL — ABNORMAL HIGH (ref 0.00–0.07)
Basophils Absolute: 0.1 10*3/uL (ref 0.0–0.1)
Basophils Relative: 0 %
Eosinophils Absolute: 0.1 10*3/uL (ref 0.0–0.5)
Eosinophils Relative: 1 %
HCT: 37.3 % (ref 36.0–46.0)
Hemoglobin: 12.1 g/dL (ref 12.0–15.0)
Immature Granulocytes: 1 %
Lymphocytes Relative: 15 %
Lymphs Abs: 1.8 10*3/uL (ref 0.7–4.0)
MCH: 28 pg (ref 26.0–34.0)
MCHC: 32.4 g/dL (ref 30.0–36.0)
MCV: 86.3 fL (ref 80.0–100.0)
Monocytes Absolute: 0.9 10*3/uL (ref 0.1–1.0)
Monocytes Relative: 8 %
Neutro Abs: 9.4 10*3/uL — ABNORMAL HIGH (ref 1.7–7.7)
Neutrophils Relative %: 75 %
Platelets: 368 10*3/uL (ref 150–400)
RBC: 4.32 MIL/uL (ref 3.87–5.11)
RDW: 13.2 % (ref 11.5–15.5)
WBC: 12.4 10*3/uL — ABNORMAL HIGH (ref 4.0–10.5)
nRBC: 0 % (ref 0.0–0.2)

## 2021-05-12 LAB — COMPREHENSIVE METABOLIC PANEL
ALT: 45 U/L — ABNORMAL HIGH (ref 0–44)
AST: 62 U/L — ABNORMAL HIGH (ref 15–41)
Albumin: 4.3 g/dL (ref 3.5–5.0)
Alkaline Phosphatase: 121 U/L (ref 38–126)
Anion gap: 8 (ref 5–15)
BUN: 25 mg/dL — ABNORMAL HIGH (ref 8–23)
CO2: 25 mmol/L (ref 22–32)
Calcium: 9.8 mg/dL (ref 8.9–10.3)
Chloride: 98 mmol/L (ref 98–111)
Creatinine, Ser: 0.79 mg/dL (ref 0.44–1.00)
GFR, Estimated: >60 mL/min (ref >60)
Glucose, Bld: 97 mg/dL (ref 70–99)
Potassium: 4.2 mmol/L (ref 3.5–5.1)
Sodium: 131 mmol/L — ABNORMAL LOW (ref 135–145)
Total Bilirubin: 0.7 mg/dL (ref 0.3–1.2)
Total Protein: 7.2 g/dL (ref 6.5–8.1)

## 2021-05-12 LAB — URINALYSIS, ROUTINE W REFLEX MICROSCOPIC
Bilirubin Urine: NEGATIVE
Glucose, UA: NEGATIVE mg/dL
Hgb urine dipstick: NEGATIVE
Ketones, ur: NEGATIVE mg/dL
Leukocytes,Ua: NEGATIVE
Nitrite: NEGATIVE
Protein, ur: NEGATIVE mg/dL
Specific Gravity, Urine: 1.013 (ref 1.005–1.030)
pH: 6 (ref 5.0–8.0)

## 2021-05-12 LAB — LIPASE, BLOOD: Lipase: 54 U/L — ABNORMAL HIGH (ref 11–51)

## 2021-05-12 MED ORDER — IOHEXOL 350 MG/ML SOLN
80.0000 mL | Freq: Once | INTRAVENOUS | Status: AC | PRN
  Administered 2021-05-12: 80 mL via INTRAVENOUS

## 2021-05-12 NOTE — ED Notes (Signed)
D/c paperwork reviewed with pt, incl f/u care.  Pt with no questions at time of d/c.  Ambulatory to ED exit.

## 2021-05-12 NOTE — ED Triage Notes (Signed)
Patient is from home and woke up with extreme abdominal pain. Patient recently seen at PCP for gallstones. Pain is localized in the RUQ.

## 2021-05-12 NOTE — ED Provider Notes (Signed)
[facility] DEPT Provider Note   CSN: [id number] Arrival date & time: 05/12/21  2423     History Chief Complaint  Patient presents with   Abdominal Pain    Sabrina Parks is a 80 y.o. female.  Sabrina Parks is a 80 y.o. female with a history of hypertension, hyperlipidemia, hypothyroidism, GERD, Raynaud's, gallstones, who presents to the emergency department for sudden onset of right upper quadrant abdominal pain.  She reports she woke up at 4:15 this morning with severe right upper quadrant abdominal pain she describes as a sharp ache.  She states that with that she was nauseated but did not have any vomiting.  Had 1 normal bowel movement.  Had some chills but no fever.  She reports as she was arriving in the emergency department the pain seemed to ease off and right now she feels okay.  She reports she has a known history of gallstones but has never had issues with it so has not had it removed or further evaluated.  Has never had similar pain before.  No pain in her flank.  No dysuria or urinary frequency.  No lower abdominal pain.  History of prior bladder surgery, appendectomy and hysterectomy.  No associated chest pain or shortness of breath.  No other aggravating or alleviating factors  The history is provided by the patient.      Past Medical History:  Diagnosis Date   Alopecia    Arthritis    Complication of anesthesia    DJD (degenerative joint disease), cervical 5/02   [facility]   GERD (gastroesophageal reflux disease)    Headache    HSV-1 infection    Hyperlipidemia    Hypertension    Hypothyroidism    Lichen sclerosus 53/61/4431   biopsy proven   Liver hemangioma 2011   Dr. Earlean Shawl   PONV (postoperative nausea and vomiting)    Raynaud's disease    Thyroiditis    Transient global amnesia    not fully diag    Patient Active Problem List   Diagnosis Date Noted   Unilateral primary osteoarthritis, right hip 11/22/2020   Lumbar  stenosis with neurogenic claudication 09/25/2020   Trochanteric bursitis, right hip 04/18/2020   Unilateral primary osteoarthritis, right knee 03/01/2020   Pain due to onychomycosis of toenails of both feet 07/07/2019   Syncope 06/01/2019   Left bundle branch block 06/01/2019   Pelvic prolapse 02/25/2019   Pain in right hip 02/23/2019   Benign neoplasm of parotid gland 54/00/8676   Lichen sclerosus et atrophicus 06/14/2016   Hypothyroidism 06/14/2016   Hypertension    Hyperlipidemia    TIA (transient ischemic attack) 06/12/2012   Alopecia 08/01/2011    Past Surgical History:  Procedure Laterality Date   APPENDECTOMY  1956   CARDIOVASCULAR STRESS TEST  10/29/2006   EF 78%.    CATARACT EXTRACTION Bilateral    CYSTOCELE REPAIR N/A 12/29/2018   Procedure: exam under anesthesia;  Surgeon: Bjorn Loser, MD;  Location: [facility];  Service: Urology;  Laterality: N/A;   LOOP RECORDER INSERTION N/A 05/01/2017   Procedure: LOOP RECORDER INSERTION;  Surgeon: Evans Lance, MD;  Location: [facility];  Service: Cardiovascular;  Laterality: N/A;   LUMBAR LAMINECTOMY/DECOMPRESSION MICRODISCECTOMY N/A 09/25/2020   Procedure: Lumbar two-three Lumbar three-four Lumbar four-five  Laminectomy and foraminotomy;  Surgeon: Kristeen Miss, MD;  Location: [facility];  Service: Neurosurgery;  Laterality: N/A;   ROBOTIC ASSISTED LAPAROSCOPIC SACROCOLPOPEXY N/A 02/25/2019   Procedure: XI ROBOTIC ASSISTED  LAPAROSCOPIC SACROCOLPOPEXY;  Surgeon: [name], MD;  Location: [facility];  Service: Urology;  Laterality: N/A;   ROTATOR CUFF REPAIR Bilateral    SHOULDER SURGERY     TONSILLECTOMY AND ADENOIDECTOMY  1957   TOTAL ABDOMINAL HYSTERECTOMY W/ BILATERAL SALPINGOOPHORECTOMY  7/90   US ECHOCARDIOGRAPHY  10/11/2009   EF 55-60%     OB History     Gravida  2   Para  2   Term  2   Preterm      AB      Living  2      SAB      IAB      Ectopic      Multiple      Live Births               Family History  Problem Relation Age of Onset   Stroke Mother    Cancer Father    Colon cancer Father        23's    Social History   Tobacco Use   Smoking status: Former    Years: 10.00    Types: Cigarettes    Quit date: 01/01/1970    Years since quitting: 51.3   Smokeless tobacco: Never  Vaping Use   Vaping Use: Never used  Substance Use Topics   Alcohol use: Yes    Comment: 1-2 glasses of wine    Drug use: No    Home Medications Prior to Admission medications   Medication Sig Start Date End Date Taking? Authorizing Provider  acetaminophen (TYLENOL) 500 MG tablet Take 500 mg by mouth every 6 (six) hours as needed for moderate pain or headache.   Yes [provider]  amitriptyline (ELAVIL) 25 MG tablet Take 25 mg by mouth at bedtime.  07/08/13  Yes [provider]  aspirin EC 81 MG tablet Take 81 mg by mouth at bedtime.   Yes [provider]  Cholecalciferol (VITAMIN D) 50 MCG (2000 UT) tablet Take 2,000 Units by mouth daily.   Yes [provider]  famotidine (PEPCID) 10 MG tablet Take 10 mg by mouth daily as needed for heartburn or indigestion.   Yes [provider]  fluticasone (FLONASE) 50 MCG/ACT nasal spray Place 1 spray into both nostrils daily as needed for allergies. 05/03/21  Yes [provider]  ibuprofen (ADVIL,MOTRIN) 200 MG tablet Take 200 mg by mouth every 8 (eight) hours as needed for headache or moderate pain.    Yes [provider]  levothyroxine (SYNTHROID, LEVOTHROID) 50 MCG tablet Take 50 mcg by mouth daily before breakfast.   Yes [provider]  LORazepam (ATIVAN) 0.5 MG tablet Take 0.5 mg by mouth daily as needed for anxiety.  07/29/13  Yes [provider]  olmesartan (BENICAR) 40 MG tablet Take 40 mg by mouth at bedtime.  09/02/17  Yes [provider]  ondansetron (ZOFRAN) 4 MG tablet Take 4 mg by mouth every 8 (eight) hours as needed for nausea/vomiting. 03/27/21   Yes [provider]  PROAIR HFA 108 (90 BASE) MCG/ACT inhaler Inhale 2 puffs into the lungs every 6 (six) hours as needed for wheezing or shortness of breath.  06/16/13  Yes [provider]  rosuvastatin (CRESTOR) 10 MG tablet Take 10 mg by mouth at bedtime. 05/03/21  Yes [provider]  triamcinolone (NASACORT) 55 MCG/ACT AERO nasal inhaler Place 1 spray into the nose daily as needed (for allergies).   Yes [provider]  triamterene-hydrochlorothiazide (MAXZIDE-25) 37.5-25 MG tablet TAKE ONE-HALF (1/2) TABLET DAILY 04/27/21  Yes Fay Records, MD    Allergies    Atorvastatin, Codeine, Morphine, Morphine and related, Other, and Sulfa antibiotics  Review of Systems   Review of Systems  Constitutional:  Positive for chills. Negative for fever.  HENT: Negative.    Respiratory:  Negative for cough and shortness of breath.   Cardiovascular:  Negative for chest pain.  Gastrointestinal:  Positive for abdominal pain and nausea. Negative for blood in stool, constipation, diarrhea and vomiting.  Genitourinary:  Negative for dysuria, flank pain and frequency.  Musculoskeletal:  Negative for arthralgias and myalgias.  Neurological:  Negative for syncope and light-headedness.  All other systems reviewed and are negative.  Physical Exam Updated Vital Signs BP 132/62   Pulse 75   Temp 97.9 F (36.6 C) (Oral)   Resp 16   Ht 5' 3.5" (1.613 m)   Wt 60.3 kg   LMP 07/29/1990   SpO2 100%   BMI 23.19 kg/m   Physical Exam Vitals and nursing note reviewed.  Constitutional:      General: She is not in acute distress.    Appearance: Normal appearance. She is well-developed and normal weight. She is not ill-appearing or diaphoretic.     Comments: Well-appearing and in no distress   HENT:     Head: Normocephalic and atraumatic.  Eyes:     General:        Right eye: No discharge.        Left eye: No discharge.     Pupils: Pupils are equal, round, and reactive  to light.  Cardiovascular:     Rate and Rhythm: Normal rate and regular rhythm.     Pulses: Normal pulses.     Heart sounds: Normal heart sounds.  Pulmonary:     Effort: Pulmonary effort is normal. No respiratory distress.     Breath sounds: Normal breath sounds. No wheezing or rales.     Comments: Respirations equal and unlabored, patient able to speak in full sentences, lungs clear to auscultation bilaterally  Abdominal:     General: Bowel sounds are normal. There is no distension.     Palpations: Abdomen is soft. There is no mass.     Tenderness: There is abdominal tenderness in the right upper quadrant. There is no guarding.     Comments: Abdomen soft, nondistended, bowel sounds present throughout, there is focal epigastric and right upper quadrant tenderness, no guarding, negative Murphy sign, no lower abdominal tenderness or CVA tenderness.  Musculoskeletal:        General: No deformity.     Cervical back: Neck supple.  Skin:    General: Skin is warm and dry.     Capillary Refill: Capillary refill takes less than 2 seconds.  Neurological:     Mental Status: She is alert and oriented to person, place, and time.     Coordination: Coordination normal.     Comments: Speech is clear, able to follow commands Moves extremities without ataxia, coordination intact  Psychiatric:        Mood and Affect: Mood normal.        Behavior: Behavior normal.    ED Results / Procedures / Treatments   Labs (all labs ordered are listed, but only abnormal results are displayed) Labs Reviewed  COMPREHENSIVE METABOLIC PANEL - Abnormal; Notable for the following components:      Result Value   Sodium 131 (*)    BUN  25 (*)    AST 62 (*)    ALT 45 (*)    All other components within normal limits  LIPASE, BLOOD - Abnormal; Notable for the following components:   Lipase 54 (*)    All other components within normal limits  CBC WITH DIFFERENTIAL/PLATELET - Abnormal; Notable for the following  components:   WBC 12.4 (*)    Neutro Abs 9.4 (*)    Abs Immature Granulocytes 0.08 (*)    All other components within normal limits  URINALYSIS, ROUTINE W REFLEX MICROSCOPIC    EKG None  Radiology CT ABDOMEN PELVIS W CONTRAST  Result Date: 05/12/2021 CLINICAL DATA:  Right upper quadrant pain, nausea EXAM: CT ABDOMEN AND PELVIS WITH CONTRAST TECHNIQUE: Multidetector CT imaging of the abdomen and pelvis was performed using the standard protocol following bolus administration of intravenous contrast. CONTRAST:  85mL OMNIPAQUE IOHEXOL 350 MG/ML SOLN COMPARISON:  CT abdomen/pelvis 03/27/2021 FINDINGS: Lower chest: Again seen is a 1.0 cm nodule in the left base, unchanged since 03/27/2021. The lung bases are otherwise clear the imaged heart is unremarkable. Hepatobiliary: A hypodense lesion in the liver tip is unchanged, likely a small cyst. Additional subcentimeter lesions in the liver are unchanged, too small to characterize but also likely small cysts (2-23, 2-12). There are no suspicious lesions. There is cholelithiasis without evidence of acute cholecystitis. There is no intra or extrahepatic biliary ductal dilatation. Pancreas: Unremarkable. Spleen: Unremarkable. Adrenals/Urinary Tract: The adrenals are unremarkable. Again seen is an ill-defined area of hypoenhancement in the medial right kidney in the interpolar region on the delayed images (7-15), not significantly changed. The kidneys are otherwise unremarkable. There are no other focal lesions. There are no stones. There is no hydronephrosis or hydroureter. The bladder is unremarkable. Stomach/Bowel: The stomach is unremarkable. There is no evidence of bowel obstruction. There is colonic diverticulosis without evidence of acute diverticulitis there is a moderate stool burden throughout the colon. Vascular/Lymphatic: There is scattered calcified atherosclerotic plaque throughout the nonaneurysmal abdominal aorta. The major branch vessels are  patent. The main portal and splenic veins are patent. There is no abdominal or pelvic lymphadenopathy. Reproductive: The uterus is surgically absent. There is no adnexal mass. Other: There is no ascites or free air. Musculoskeletal: There is multilevel degenerative change throughout the lumbar spine and of both hips. Lucency in the L4 vertebral body is unchanged since 2009. There is no acute osseous abnormality or aggressive osseous lesion. IMPRESSION: 1. Cholelithiasis without evidence of acute cholecystitis. No biliary ductal dilatation. 2. Moderate stool burden throughout the colon. 3. Unchanged ill-defined area of hypoenhancement in the right kidney. Given persistence, consider nonemergent outpatient MRI of the abdomen with and without contrast for further evaluation. 4. Unchanged 1.0 cm left lower lobe pulmonary nodule. Recommend follow up CT chest in 6 months. Aortic Atherosclerosis (ICD10-I70.0). Electronically Signed   By: Valetta Mole M.D.   On: 05/12/2021 09:55   US Abdomen Limited RUQ (LIVER/GB)  Result Date: 05/12/2021 CLINICAL DATA:  Gallstones. EXAM: ULTRASOUND ABDOMEN LIMITED RIGHT UPPER QUADRANT COMPARISON:  CT abdomen dated 03/27/2021. FINDINGS: Gallbladder: Small layering stones and/or sludge within the gallbladder. No gallbladder wall thickening or pericholecystic fluid. No sonographic Murphy's sign. Common bile duct: Diameter: 5 mm Liver: No focal lesion identified. Within normal limits in parenchymal echogenicity. Portal vein is patent on color Doppler imaging with normal direction of blood flow towards the liver. Other: None. IMPRESSION: 1. Small amount of layering stones and/or sludge within the gallbladder. 2. No acute findings. No  evidence of acute or chronic cholecystitis. No bile duct dilatation. Electronically Signed   By: Franki Cabot M.D.   On: 05/12/2021 07:45      Procedures Procedures   Medications Ordered in ED Medications  iohexol (OMNIPAQUE) 350 MG/ML injection 80 mL  (80 mLs Intravenous Contrast Given 05/12/21 0919)    ED Course  I have reviewed the triage vital signs and the nursing notes.  Pertinent labs & imaging results that were available during my care of the patient were reviewed by me and considered in my medical decision making (see chart for details).  Clinical Course as of 05/12/21 1024  Sat May 12, 2021  0823 This is a 80 year old female with a history of biliary stones presenting to ED with right upper quadrant abdominal pain.  She reports abrupt onset of pain that woke her from sleep around 4 AM.  It radiated towards her back.  It was a sharp stabbing feeling.  The pain has eased off and gone away completely after her arrival in the ER.  She reports she never had this kind of significant pain before, but she occasionally gets "twinges" in her right upper quadrant after eating a meal.  She was told by her GI doctor that she may need her gallbladder removed at some point.  Currently she is asymptomatic in the room and well-appearing.  She denies any active nausea or vomiting, or diarrhea.  Denies any history of kidney stones.  She is afebrile with unremarkable vital signs.  On abdominal exam she has some very mild tenderness near the epigastrium and right upper quadrant, negative Murphy sign, no rebound or guarding.  Right upper quadrant ultrasound shows some biliary sludge without common bile duct dilation or other stigmata of acute cholecystitis.  Labs show some very minor leukocytosis, mild transminitis, and lipase 54.  Will pursue CT abdomen pelvis to evaluate for possible retained biliary stone - if unremarkable anticipate discharge home. [MT]  1010 CT with several chronic findings, nothing acute, no evidence of acute biliary obstruction or pancreatitis.  Given that she is asymptomatic I think she is reasonably safe for discharge at this time. [MT]    Clinical Course User Index [MT] Trifan, Carola Rhine, MD   MDM Rules/Calculators/A&P                            Patient presents to the ED with complaints of abdominal pain. Patient nontoxic appearing, in no apparent distress, vitals WNL. On exam patient tender to palpation in RUQ with mild epigastric tenderness, no peritoneal signs. Will evaluate with labs and RUQ [place].  Currently pain is subsided so we will hold off on any pain medication.    Additional history obtained:  Additional history obtained from chart review & nursing note review.   Lab Tests:  I Ordered, reviewed, and interpreted labs, which included:  CBC: Mild leukocytosis of 12.4, normal hemoglobin CMP: Mild hyponatremia, no other significant electrolyte derangements and normal renal function, patient does have a mild transaminitis with AST of 62 and ALT of 45 Lipase: Mildly elevated at 54 UA: No leukocytes, no signs of infection  Imaging Studies ordered:  I ordered imaging studies which included right upper quadrant ultrasound, I independently reviewed, formal radiology impression shows:  Small amount of layering stones and/or sludge in the gallbladder but no evidence of acute or chronic cholecystitis, normal common bile duct diameter.  Given patient's CT abdomen pelvis to better assess for potential  choledocholithiasis.  CT without evidence of biliary obstruction, cholelithiasis again noted but no other acute changes, incidental finding of the left lung pulmonary nodule and an unchanged area of hypoenhancement in the right kidney which patient will follow up with PCP regarding  ED Course:   RE-EVAL: Patient's pain has not recurred throughout her ED stay, she reports she feels fine and would like to go home.  On repeat abdominal exam patient remains without peritoneal signs, low suspicion for cholecystitis, pancreatitis, diverticulitis, appendicitis, bowel obstruction/perforation, or other acute surgical process. Patient tolerating PO in the emergency department.  Presentation consistent with biliary colic, patient's pain  has not recurred here in the ED.  Will refer to general surgery.  Will discharge home with supportive measures. I discussed results, treatment plan, need for PCP follow-up for close recheck of LFTs and lipase, and return precautions with the patient. Provided opportunity for questions, patient confirmed understanding and is in agreement with plan.    Portions of this note were generated with [employer]. Dictation errors may occur despite best attempts at proofreading.     Final Clinical Impression(s) / ED Diagnoses Final diagnoses:  Gallstones  Biliary colic    Rx / DC Orders ED Discharge Orders     None        Janet Berlin 05/12/21 1030    Wyvonnia Dusky, MD 05/12/21 1447

## 2021-05-12 NOTE — Discharge Instructions (Addendum)
Your ultrasound and CT scan show gallstones we do not see evidence of acute infection or inflammation of the gallbladder or any gallstone stuck in your bile duct at this time.  Your liver function tests and pancreatic enzymes were mildly elevated please have these rechecked with your doctor this week.  Follow-up with general surgery to discuss potential need for gallbladder surgery in the future.  Your CT scan had incidental findings of a 1 cm pulmonary nodule in your left lung as well as an area of hypoenhancement on your right kidney that has been seen on prior studies.  Please follow-up with your PCP regarding these findings.  If you develop persistent and worsening abdominal pain, fevers, vomiting or any worsening symptoms please return to the emergency department.

## 2021-05-12 NOTE — ED Notes (Signed)
US at bedside

## 2021-05-12 NOTE — ED Notes (Signed)
Pt transported to CT ?

## 2021-05-19 DIAGNOSIS — Z23 Encounter for immunization: Secondary | ICD-10-CM | POA: Diagnosis not present

## 2021-05-21 DIAGNOSIS — H353211 Exudative age-related macular degeneration, right eye, with active choroidal neovascularization: Secondary | ICD-10-CM | POA: Diagnosis not present

## 2021-05-25 DIAGNOSIS — K5901 Slow transit constipation: Secondary | ICD-10-CM | POA: Diagnosis not present

## 2021-05-25 DIAGNOSIS — K2100 Gastro-esophageal reflux disease with esophagitis, without bleeding: Secondary | ICD-10-CM | POA: Diagnosis not present

## 2021-05-31 DIAGNOSIS — K8010 Calculus of gallbladder with chronic cholecystitis without obstruction: Secondary | ICD-10-CM | POA: Diagnosis not present

## 2021-06-11 DIAGNOSIS — K1370 Unspecified lesions of oral mucosa: Secondary | ICD-10-CM | POA: Diagnosis not present

## 2021-06-12 ENCOUNTER — Encounter (HOSPITAL_COMMUNITY): Payer: Self-pay | Admitting: Radiology

## 2021-06-26 DIAGNOSIS — L821 Other seborrheic keratosis: Secondary | ICD-10-CM | POA: Diagnosis not present

## 2021-06-26 DIAGNOSIS — Z85828 Personal history of other malignant neoplasm of skin: Secondary | ICD-10-CM | POA: Diagnosis not present

## 2021-06-26 DIAGNOSIS — L570 Actinic keratosis: Secondary | ICD-10-CM | POA: Diagnosis not present

## 2021-06-26 DIAGNOSIS — N390 Urinary tract infection, site not specified: Secondary | ICD-10-CM | POA: Diagnosis not present

## 2021-06-26 DIAGNOSIS — D692 Other nonthrombocytopenic purpura: Secondary | ICD-10-CM | POA: Diagnosis not present

## 2021-06-29 ENCOUNTER — Other Ambulatory Visit: Payer: Self-pay | Admitting: Internal Medicine

## 2021-06-29 DIAGNOSIS — R911 Solitary pulmonary nodule: Secondary | ICD-10-CM

## 2021-06-29 DIAGNOSIS — N289 Disorder of kidney and ureter, unspecified: Secondary | ICD-10-CM

## 2021-07-03 ENCOUNTER — Ambulatory Visit
Admission: RE | Admit: 2021-07-03 | Discharge: 2021-07-03 | Disposition: A | Discharge status: Home / Self Care | Payer: Medicare Other | Source: Ambulatory Visit | Attending: Internal Medicine | Admitting: Internal Medicine

## 2021-07-03 DIAGNOSIS — J984 Other disorders of lung: Secondary | ICD-10-CM | POA: Diagnosis not present

## 2021-07-03 DIAGNOSIS — Z87448 Personal history of other diseases of urinary system: Secondary | ICD-10-CM | POA: Diagnosis not present

## 2021-07-03 DIAGNOSIS — K449 Diaphragmatic hernia without obstruction or gangrene: Secondary | ICD-10-CM | POA: Diagnosis not present

## 2021-07-03 DIAGNOSIS — N289 Disorder of kidney and ureter, unspecified: Secondary | ICD-10-CM

## 2021-07-03 DIAGNOSIS — R911 Solitary pulmonary nodule: Secondary | ICD-10-CM

## 2021-07-03 DIAGNOSIS — M160 Bilateral primary osteoarthritis of hip: Secondary | ICD-10-CM | POA: Diagnosis not present

## 2021-07-03 DIAGNOSIS — K8020 Calculus of gallbladder without cholecystitis without obstruction: Secondary | ICD-10-CM | POA: Diagnosis not present

## 2021-07-03 DIAGNOSIS — M47817 Spondylosis without myelopathy or radiculopathy, lumbosacral region: Secondary | ICD-10-CM | POA: Diagnosis not present

## 2021-07-03 MED ORDER — IOPAMIDOL (ISOVUE-300) INJECTION 61%
100.0000 mL | Freq: Once | INTRAVENOUS | Status: AC | PRN
  Administered 2021-07-03: 100 mL via INTRAVENOUS

## 2021-07-11 DIAGNOSIS — H43813 Vitreous degeneration, bilateral: Secondary | ICD-10-CM | POA: Diagnosis not present

## 2021-07-11 DIAGNOSIS — H0100A Unspecified blepharitis right eye, upper and lower eyelids: Secondary | ICD-10-CM | POA: Diagnosis not present

## 2021-07-11 DIAGNOSIS — H524 Presbyopia: Secondary | ICD-10-CM | POA: Diagnosis not present

## 2021-07-11 DIAGNOSIS — H26492 Other secondary cataract, left eye: Secondary | ICD-10-CM | POA: Diagnosis not present

## 2021-07-16 ENCOUNTER — Encounter (HOSPITAL_BASED_OUTPATIENT_CLINIC_OR_DEPARTMENT_OTHER): Payer: Self-pay | Admitting: *Deleted

## 2021-07-16 ENCOUNTER — Encounter (HOSPITAL_BASED_OUTPATIENT_CLINIC_OR_DEPARTMENT_OTHER): Payer: Self-pay | Admitting: Obstetrics & Gynecology

## 2021-07-16 ENCOUNTER — Other Ambulatory Visit: Payer: Self-pay

## 2021-07-16 ENCOUNTER — Ambulatory Visit (INDEPENDENT_AMBULATORY_CARE_PROVIDER_SITE_OTHER): Payer: Medicare Other | Admitting: Obstetrics & Gynecology

## 2021-07-16 VITALS — BP 133/57 | HR 87 | Ht 63.75 in | Wt 135.8 lb

## 2021-07-16 DIAGNOSIS — Z87440 Personal history of urinary (tract) infections: Secondary | ICD-10-CM

## 2021-07-16 DIAGNOSIS — Z01411 Encounter for gynecological examination (general) (routine) with abnormal findings: Secondary | ICD-10-CM | POA: Diagnosis not present

## 2021-07-16 DIAGNOSIS — L900 Lichen sclerosus et atrophicus: Secondary | ICD-10-CM

## 2021-07-16 DIAGNOSIS — R350 Frequency of micturition: Secondary | ICD-10-CM | POA: Diagnosis not present

## 2021-07-16 DIAGNOSIS — Z90710 Acquired absence of both cervix and uterus: Secondary | ICD-10-CM | POA: Diagnosis not present

## 2021-07-16 LAB — POCT URINALYSIS DIPSTICK
Bilirubin, UA: NEGATIVE
Blood, UA: NEGATIVE
Glucose, UA: NEGATIVE
Ketones, UA: NEGATIVE
Nitrite, UA: NEGATIVE
Protein, UA: NEGATIVE
Spec Grav, UA: >=1.03 — AB (ref 1.010–1.025)
Urobilinogen, UA: 0.2 E.U./dL
pH, UA: 7 (ref 5.0–8.0)

## 2021-07-16 MED ORDER — CLOBETASOL PROPIONATE 0.05 % EX OINT: 1.0000 "application " | TOPICAL_OINTMENT | Freq: Two times a day (BID) | CUTANEOUS | 0 refills | Status: DC

## 2021-07-16 NOTE — Progress Notes (Signed)
80 y.o. G32P2002 Married White or Caucasian female here for breast and pelvic exam.  I am also following her for h/o lichen sclerosus.  Reports no significant itching.  .  Denies vaginal bleeding.  Is scheduled for cholecystectomy on Friday.  Has experienced two attacks this past year of pain.  Ready to have this done.  Has surgery with Dr. Matilde Sprang 12/29/2018.  Reports not really anything was done.  Not completely sure why.  Operative report reviewed with pt and questions answered.  Report states anterior vagina was very short under anesthesia.  She then saw Dr. Louis Meckel and had sacrocolpopexy.  Denies vaginal bleeding.  Has three UTIs since surgery.  Now has painful intercourse.  She has used estradiol cream a little bit externally.  Patient's last menstrual period was 07/29/1990.          Sexually active: Yes.    H/O STD:  no  Health Maintenance: PCP:  Dr. Brigitte Pulse.  Last wellness appt was 03/2021.  Did blood work at that appt:  yes Vaccines are up to date:  yes Colonoscopy:  2019, Dr. Watt Climes MMG:  02/23/2021 Negative BMD:  done with Dr. Brigitte Pulse Last pap smear:  not indicated.   H/o abnormal pap smear:  no   reports that she quit smoking about 51 years ago. Her smoking use included cigarettes. She has never used smokeless tobacco. She reports current alcohol use. She reports that she does not use drugs.  Past Medical History:  Diagnosis Date   Alopecia    Arthritis    Complication of anesthesia    DJD (degenerative joint disease), cervical 5/02   [facility]   GERD (gastroesophageal reflux disease)    Headache    HSV-1 infection    Hyperlipidemia    Hypertension    Hypothyroidism    Lichen sclerosus 13/02/6577   biopsy proven   Liver hemangioma 2011   Dr. Earlean Shawl   PONV (postoperative nausea and vomiting)    Raynaud's disease    Thyroiditis    Transient global amnesia    not fully diag    Past Surgical History:  Procedure Laterality Date   APPENDECTOMY  1956   CARDIOVASCULAR  STRESS TEST  10/29/2006   EF 78%.    CATARACT EXTRACTION Bilateral    CYSTOCELE REPAIR N/A 12/29/2018   Procedure: exam under anesthesia;  Surgeon: Bjorn Loser, MD;  Location: [facility];  Service: Urology;  Laterality: N/A;   LOOP RECORDER INSERTION N/A 05/01/2017   Procedure: LOOP RECORDER INSERTION;  Surgeon: Evans Lance, MD;  Location: [facility];  Service: Cardiovascular;  Laterality: N/A;   LUMBAR LAMINECTOMY/DECOMPRESSION MICRODISCECTOMY N/A 09/25/2020   Procedure: Lumbar two-three Lumbar three-four Lumbar four-five  Laminectomy and foraminotomy;  Surgeon: Kristeen Miss, MD;  Location: [facility];  Service: Neurosurgery;  Laterality: N/A;   ROBOTIC ASSISTED LAPAROSCOPIC SACROCOLPOPEXY N/A 02/25/2019   Procedure: XI ROBOTIC ASSISTED LAPAROSCOPIC SACROCOLPOPEXY;  Surgeon: [name], MD;  Location: [facility];  Service: Urology;  Laterality: N/A;   ROTATOR CUFF REPAIR Bilateral    SHOULDER SURGERY     TONSILLECTOMY AND ADENOIDECTOMY  1957   TOTAL ABDOMINAL HYSTERECTOMY W/ BILATERAL SALPINGOOPHORECTOMY  7/90   US ECHOCARDIOGRAPHY  10/11/2009   EF 55-60%    Current Outpatient Medications  Medication Sig Dispense Refill   acetaminophen (TYLENOL) 500 MG tablet Take 500 mg by mouth every 6 (six) hours as needed for moderate pain or headache.     amitriptyline (ELAVIL) 25 MG tablet Take 25 mg  by mouth at bedtime.      aspirin EC 81 MG tablet Take 81 mg by mouth at bedtime.     Cholecalciferol (VITAMIN D) 50 MCG (2000 UT) tablet Take 2,000 Units by mouth daily.     clobetasol ointment (TEMOVATE) 2.53 % Apply 1 application topically 2 (two) times daily. Apply as directed twice daily.  Do not use for more than 5 days. 60 g 0   famotidine (PEPCID) 10 MG tablet Take 10 mg by mouth daily as needed for heartburn or indigestion.     fluticasone (FLONASE) 50 MCG/ACT nasal spray Place 1 spray into both nostrils daily as needed for allergies.     ibuprofen (ADVIL,MOTRIN) 200 MG tablet Take 200  mg by mouth every 8 (eight) hours as needed for headache or moderate pain.      levothyroxine (SYNTHROID, LEVOTHROID) 50 MCG tablet Take 50 mcg by mouth daily before breakfast.     LORazepam (ATIVAN) 0.5 MG tablet Take 0.5 mg by mouth daily as needed for anxiety.      olmesartan (BENICAR) 40 MG tablet Take 40 mg by mouth at bedtime.      omeprazole (PRILOSEC) 20 MG capsule Take 20 mg by mouth daily.     ondansetron (ZOFRAN) 4 MG tablet Take 4 mg by mouth every 8 (eight) hours as needed for nausea/vomiting.     PROAIR HFA 108 (90 BASE) MCG/ACT inhaler Inhale 2 puffs into the lungs every 6 (six) hours as needed for wheezing or shortness of breath.      rosuvastatin (CRESTOR) 10 MG tablet Take 10 mg by mouth at bedtime.     triamcinolone (NASACORT) 55 MCG/ACT AERO nasal inhaler Place 1 spray into the nose daily as needed (for allergies).     triamterene-hydrochlorothiazide (MAXZIDE-25) 37.5-25 MG tablet TAKE ONE-HALF (1/2) TABLET DAILY (Patient taking differently: Take 0.5 tablets by mouth daily.) 45 tablet 2   No current facility-administered medications for this visit.    Family History  Problem Relation Age of Onset   Stroke Mother    Cancer Father    Colon cancer Father        65's    Review of Systems  All other systems reviewed and are negative.  Exam:   BP (!) 133/57 (BP Location: Left Arm, Patient Position: Sitting, Cuff Size: Normal)    Pulse 87    Ht 5' 3.75" (1.619 m)    Wt 135 lb 12.8 oz (61.6 kg)    LMP 07/29/1990    BMI 23.49 kg/m   Height: 5' 3.75" (161.9 cm)  General appearance: alert, cooperative and appears stated age Breasts: normal appearance, no masses or tenderness Abdomen: soft, non-tender; bowel sounds normal; no masses,  no organomegaly Lymph nodes: Cervical, supraclavicular, and axillary nodes normal.  No abnormal inguinal nodes palpated Neurologic: Grossly normal  Pelvic: External genitalia:  hypopigmentation within labia majora and minora present as well as  on perineal body              Urethra:  normal appearing urethra with no masses, tenderness or lesions              Bartholins and Skenes: normal                 Vagina: normal appearing vagina with atrophic changes and no discharge, no lesions              Cervix: absent  Pap taken: No. Bimanual Exam:  Uterus:  uterus absent              Adnexa: normal adnexa and no mass, fullness, tenderness               Rectovaginal: Confirms               Anus:  normal sphincter tone, no lesions  Chaperone, Octaviano Batty, CMA, was present for exam.  Assessment/Plan: 1. Encntr for gyn exam (general) (routine) w abnormal findings - pap smear not indicated - MMG 01/2021 - colonoscopy 2019, Dr. Watt Climes, release signed - BMD done with Dr. Brigitte Pulse - lab work done with Dr. Brigitte Pulse - vaccines reviewed/updated  2. Lichen sclerosus et atrophicus - feel should start treatment again with clobetasol.  Rx to pharmacy.  Recheck 1 month  3. History of recurrent UTIs - POCT urine today with small amt WBCs.  Not enough urine for culture.  Advised pt to call with any symptoms  4. H/O: hysterectomy with BSO 01/1989

## 2021-07-19 NOTE — Addendum Note (Signed)
Addended by: Octaviano Batty B on: 07/19/2021 03:43 PM   Modules accepted: Orders

## 2021-07-20 ENCOUNTER — Other Ambulatory Visit: Payer: Self-pay | Admitting: Surgery

## 2021-07-20 DIAGNOSIS — K8020 Calculus of gallbladder without cholecystitis without obstruction: Secondary | ICD-10-CM | POA: Diagnosis not present

## 2021-07-20 DIAGNOSIS — K811 Chronic cholecystitis: Secondary | ICD-10-CM | POA: Diagnosis not present

## 2021-08-27 DIAGNOSIS — H353211 Exudative age-related macular degeneration, right eye, with active choroidal neovascularization: Secondary | ICD-10-CM | POA: Diagnosis not present

## 2021-08-28 ENCOUNTER — Ambulatory Visit (HOSPITAL_BASED_OUTPATIENT_CLINIC_OR_DEPARTMENT_OTHER): Payer: Medicare Other | Admitting: Obstetrics & Gynecology

## 2021-08-29 ENCOUNTER — Encounter (HOSPITAL_BASED_OUTPATIENT_CLINIC_OR_DEPARTMENT_OTHER): Payer: Self-pay | Admitting: Obstetrics & Gynecology

## 2021-08-29 ENCOUNTER — Other Ambulatory Visit: Payer: Self-pay

## 2021-08-29 ENCOUNTER — Ambulatory Visit (INDEPENDENT_AMBULATORY_CARE_PROVIDER_SITE_OTHER): Payer: Medicare Other | Admitting: Obstetrics & Gynecology

## 2021-08-29 VITALS — BP 148/66 | HR 80 | Ht 63.75 in | Wt 137.2 lb

## 2021-08-29 DIAGNOSIS — L900 Lichen sclerosus et atrophicus: Secondary | ICD-10-CM | POA: Diagnosis not present

## 2021-08-29 DIAGNOSIS — N952 Postmenopausal atrophic vaginitis: Secondary | ICD-10-CM | POA: Diagnosis not present

## 2021-08-29 MED ORDER — MOMETASONE FUROATE 0.1 % EX OINT: TOPICAL_OINTMENT | CUTANEOUS | 3 refills | Status: DC

## 2021-08-29 MED ORDER — ESTRADIOL 0.1 MG/GM VA CREA: TOPICAL_CREAM | VAGINAL | 0 refills | Status: DC

## 2021-09-01 NOTE — Progress Notes (Signed)
GYNECOLOGY  VISIT  CC:   follow up vulvar exam  HPI: 81 y.o. G80P2002 Married White or Caucasian female here for follow up after being seen for worsening lichen sclerosus.  She is using topical clobetasol bid and reports symptoms are much better except in one location above clitoris.  Denies vaginal bleeding or discharge.  No bleeding from skin, either.  Patient Active Problem List   Diagnosis Date Noted   Unilateral primary osteoarthritis, right hip 11/22/2020   Lumbar stenosis with neurogenic claudication 09/25/2020   Trochanteric bursitis, right hip 04/18/2020   Unilateral primary osteoarthritis, right knee 03/01/2020   Pain due to onychomycosis of toenails of both feet 07/07/2019   Syncope 06/01/2019   Left bundle branch block 06/01/2019   Pelvic prolapse 02/25/2019   Pain in right hip 02/23/2019   Benign neoplasm of parotid gland 78/46/9629   Lichen sclerosus et atrophicus 06/14/2016   Hypothyroidism 06/14/2016   Hypertension    Hyperlipidemia    TIA (transient ischemic attack) 06/12/2012   Alopecia 08/01/2011    Past Medical History:  Diagnosis Date   Alopecia    Arthritis    Complication of anesthesia    DJD (degenerative joint disease), cervical 5/02   [facility]   GERD (gastroesophageal reflux disease)    Headache    HSV-1 infection    Hyperlipidemia    Hypertension    Hypothyroidism    Lichen sclerosus 52/84/1324   biopsy proven   Liver hemangioma 2011   Dr. Earlean Shawl   PONV (postoperative nausea and vomiting)    Raynaud's disease    Thyroiditis    Transient global amnesia    not fully diag    Past Surgical History:  Procedure Laterality Date   APPENDECTOMY  1956   CARDIOVASCULAR STRESS TEST  10/29/2006   EF 78%.    CATARACT EXTRACTION Bilateral    CYSTOCELE REPAIR N/A 12/29/2018   Procedure: exam under anesthesia;  Surgeon: Bjorn Loser, MD;  [facility];  Service: Urology;  Laterality: N/A;   LOOP RECORDER INSERTION N/A 05/01/2017   Procedure: LOOP  RECORDER INSERTION;  Surgeon: Evans Lance, MD;  Location: [facility];  Service: Cardiovascular;  Laterality: N/A;   LUMBAR LAMINECTOMY/DECOMPRESSION MICRODISCECTOMY N/A 09/25/2020   Procedure: Lumbar two-three Lumbar three-four Lumbar four-five  Laminectomy and foraminotomy;  Surgeon: Kristeen Miss, MD;  Location: [facility];  Service: Neurosurgery;  Laterality: N/A;   ROBOTIC ASSISTED LAPAROSCOPIC SACROCOLPOPEXY N/A 02/25/2019   Procedure: XI ROBOTIC ASSISTED LAPAROSCOPIC SACROCOLPOPEXY;  Surgeon: [name], MD;  [facility];  Service: Urology;  Laterality: N/A;   ROTATOR CUFF REPAIR Bilateral    SHOULDER SURGERY     TONSILLECTOMY AND ADENOIDECTOMY  1957   TOTAL ABDOMINAL HYSTERECTOMY W/ BILATERAL SALPINGOOPHORECTOMY  7/90   US ECHOCARDIOGRAPHY  10/11/2009   EF 55-60%    MEDS:   Current Outpatient Medications on File Prior to Visit  Medication Sig Dispense Refill   acetaminophen (TYLENOL) 500 MG tablet Take 500 mg by mouth every 6 (six) hours as needed for moderate pain or headache.     amitriptyline (ELAVIL) 25 MG tablet Take 25 mg by mouth at bedtime.      aspirin EC 81 MG tablet Take 81 mg by mouth at bedtime.     Cholecalciferol (VITAMIN D) 50 MCG (2000 UT) tablet Take 2,000 Units by mouth daily.     clobetasol ointment (TEMOVATE) 4.01 % Apply 1 application topically 2 (two) times daily. Apply as directed twice daily.  Do not use for more than 5 days. 60 g 0   famotidine (PEPCID) 10 MG tablet Take 10 mg by mouth daily as needed for heartburn or indigestion.     fluticasone (FLONASE) 50 MCG/ACT nasal spray Place 1 spray into both nostrils daily as needed for allergies.     ibuprofen (ADVIL,MOTRIN) 200 MG tablet Take 200 mg by mouth every 8 (eight) hours as needed for headache or moderate pain.      levothyroxine (SYNTHROID, LEVOTHROID) 50 MCG tablet Take 50 mcg by mouth daily before breakfast.     LORazepam (ATIVAN) 0.5 MG tablet Take 0.5 mg by mouth daily as needed  for anxiety.      olmesartan (BENICAR) 40 MG tablet Take 40 mg by mouth at bedtime.      omeprazole (PRILOSEC) 20 MG capsule Take 20 mg by mouth daily.     ondansetron (ZOFRAN) 4 MG tablet Take 4 mg by mouth every 8 (eight) hours as needed for nausea/vomiting.     PROAIR HFA 108 (90 BASE) MCG/ACT inhaler Inhale 2 puffs into the lungs every 6 (six) hours as needed for wheezing or shortness of breath.      rosuvastatin (CRESTOR) 10 MG tablet Take 10 mg by mouth at bedtime.     triamcinolone (NASACORT) 55 MCG/ACT AERO nasal inhaler Place 1 spray into the nose daily as needed (for allergies).     triamterene-hydrochlorothiazide (MAXZIDE-25) 37.5-25 MG tablet TAKE ONE-HALF (1/2) TABLET DAILY (Patient taking differently: Take 0.5 tablets by mouth daily.) 45 tablet 2   No current facility-administered medications on file prior to visit.    ALLERGIES: Atorvastatin, Codeine, Morphine, Morphine and related, Other, and Sulfa antibiotics  Family History  Problem Relation Age of Onset   Stroke Mother    Cancer Father    Colon cancer Father        97's    SH:  married, non smoker  Review of Systems  Constitutional: Negative.   Genitourinary:        Vulvar itching   PHYSICAL EXAMINATION:    BP (!) 148/66 (BP Location: Right Arm, Patient Position: Sitting, Cuff Size: Normal)    Pulse 80    Ht 5' 3.75" (1.619 m) Comment: reported   Wt 137 lb 3.2 oz (62.2 kg)    LMP 07/29/1990    BMI 23.74 kg/m     Physical Exam Constitutional:      Appearance: Normal appearance.  Genitourinary:   Lymphadenopathy:     Lower Body: No right inguinal adenopathy. No left inguinal adenopathy.  Neurological:     General: No focal deficit present.     Mental Status: She is alert.  Psychiatric:        Mood and Affect: Mood normal.    Chaperone, Octaviano Batty, CMA, was present for exam.  Assessment/Plan: 1. Lichen sclerosus et atrophicus - pt is going to stop using clobetasol and transition to maintenance  therapy with mometasone.  She will use this twice weekly - she is advised to continue the clobetasol above the clitoris for at least four weeks.  If itching does not resolve, she is to call - mometasone (ELOCON) 0.1 % ointment; Apply topically twice weekly  Dispense: 45 g; Refill: 3 - recheck 1 year if symptoms are stable  2. Vaginal atrophy - estradiol (ESTRACE VAGINAL) 0.1 MG/GM vaginal cream; Apply pea size amount topically daily.  Dispense: 42.5 g; Refill: 0

## 2021-09-24 DIAGNOSIS — L570 Actinic keratosis: Secondary | ICD-10-CM | POA: Diagnosis not present

## 2021-09-24 DIAGNOSIS — L820 Inflamed seborrheic keratosis: Secondary | ICD-10-CM | POA: Diagnosis not present

## 2021-09-24 DIAGNOSIS — Z85828 Personal history of other malignant neoplasm of skin: Secondary | ICD-10-CM | POA: Diagnosis not present

## 2021-10-01 DIAGNOSIS — H35373 Puckering of macula, bilateral: Secondary | ICD-10-CM | POA: Diagnosis not present

## 2021-10-01 DIAGNOSIS — H35453 Secondary pigmentary degeneration, bilateral: Secondary | ICD-10-CM | POA: Diagnosis not present

## 2021-10-01 DIAGNOSIS — H353122 Nonexudative age-related macular degeneration, left eye, intermediate dry stage: Secondary | ICD-10-CM | POA: Diagnosis not present

## 2021-10-01 DIAGNOSIS — H35363 Drusen (degenerative) of macula, bilateral: Secondary | ICD-10-CM | POA: Diagnosis not present

## 2021-10-01 DIAGNOSIS — Z961 Presence of intraocular lens: Secondary | ICD-10-CM | POA: Diagnosis not present

## 2021-10-01 DIAGNOSIS — H353211 Exudative age-related macular degeneration, right eye, with active choroidal neovascularization: Secondary | ICD-10-CM | POA: Diagnosis not present

## 2021-10-16 DIAGNOSIS — I10 Essential (primary) hypertension: Secondary | ICD-10-CM | POA: Diagnosis not present

## 2021-10-16 DIAGNOSIS — K5900 Constipation, unspecified: Secondary | ICD-10-CM | POA: Diagnosis not present

## 2021-10-16 DIAGNOSIS — E785 Hyperlipidemia, unspecified: Secondary | ICD-10-CM | POA: Diagnosis not present

## 2021-10-16 DIAGNOSIS — R112 Nausea with vomiting, unspecified: Secondary | ICD-10-CM | POA: Diagnosis not present

## 2021-10-16 DIAGNOSIS — E871 Hypo-osmolality and hyponatremia: Secondary | ICD-10-CM | POA: Diagnosis not present

## 2021-10-16 DIAGNOSIS — R197 Diarrhea, unspecified: Secondary | ICD-10-CM | POA: Diagnosis not present

## 2021-10-19 DIAGNOSIS — E785 Hyperlipidemia, unspecified: Secondary | ICD-10-CM | POA: Diagnosis not present

## 2021-10-22 DIAGNOSIS — M5416 Radiculopathy, lumbar region: Secondary | ICD-10-CM | POA: Diagnosis not present

## 2021-10-22 DIAGNOSIS — M5116 Intervertebral disc disorders with radiculopathy, lumbar region: Secondary | ICD-10-CM | POA: Diagnosis not present

## 2021-11-26 DIAGNOSIS — H353211 Exudative age-related macular degeneration, right eye, with active choroidal neovascularization: Secondary | ICD-10-CM | POA: Diagnosis not present

## 2021-12-12 DIAGNOSIS — E041 Nontoxic single thyroid nodule: Secondary | ICD-10-CM | POA: Diagnosis not present

## 2021-12-12 DIAGNOSIS — R7301 Impaired fasting glucose: Secondary | ICD-10-CM | POA: Diagnosis not present

## 2021-12-12 DIAGNOSIS — E785 Hyperlipidemia, unspecified: Secondary | ICD-10-CM | POA: Diagnosis not present

## 2021-12-12 DIAGNOSIS — I10 Essential (primary) hypertension: Secondary | ICD-10-CM | POA: Diagnosis not present

## 2021-12-12 DIAGNOSIS — E039 Hypothyroidism, unspecified: Secondary | ICD-10-CM | POA: Diagnosis not present

## 2021-12-12 DIAGNOSIS — D649 Anemia, unspecified: Secondary | ICD-10-CM | POA: Diagnosis not present

## 2021-12-19 DIAGNOSIS — I10 Essential (primary) hypertension: Secondary | ICD-10-CM | POA: Diagnosis not present

## 2021-12-19 DIAGNOSIS — R82998 Other abnormal findings in urine: Secondary | ICD-10-CM | POA: Diagnosis not present

## 2021-12-19 DIAGNOSIS — R911 Solitary pulmonary nodule: Secondary | ICD-10-CM | POA: Diagnosis not present

## 2021-12-19 DIAGNOSIS — Z8673 Personal history of transient ischemic attack (TIA), and cerebral infarction without residual deficits: Secondary | ICD-10-CM | POA: Diagnosis not present

## 2021-12-19 DIAGNOSIS — Z0000 Encounter for general adult medical examination without abnormal findings: Secondary | ICD-10-CM | POA: Diagnosis not present

## 2021-12-19 DIAGNOSIS — M25551 Pain in right hip: Secondary | ICD-10-CM | POA: Diagnosis not present

## 2021-12-19 DIAGNOSIS — E785 Hyperlipidemia, unspecified: Secondary | ICD-10-CM | POA: Diagnosis not present

## 2021-12-19 DIAGNOSIS — Z1339 Encounter for screening examination for other mental health and behavioral disorders: Secondary | ICD-10-CM | POA: Diagnosis not present

## 2021-12-19 DIAGNOSIS — D320 Benign neoplasm of cerebral meninges: Secondary | ICD-10-CM | POA: Diagnosis not present

## 2021-12-19 DIAGNOSIS — D692 Other nonthrombocytopenic purpura: Secondary | ICD-10-CM | POA: Diagnosis not present

## 2021-12-19 DIAGNOSIS — R7301 Impaired fasting glucose: Secondary | ICD-10-CM | POA: Diagnosis not present

## 2021-12-19 DIAGNOSIS — I700 Atherosclerosis of aorta: Secondary | ICD-10-CM | POA: Diagnosis not present

## 2021-12-19 DIAGNOSIS — Z1331 Encounter for screening for depression: Secondary | ICD-10-CM | POA: Diagnosis not present

## 2021-12-19 DIAGNOSIS — E871 Hypo-osmolality and hyponatremia: Secondary | ICD-10-CM | POA: Diagnosis not present

## 2021-12-19 DIAGNOSIS — N281 Cyst of kidney, acquired: Secondary | ICD-10-CM | POA: Diagnosis not present

## 2021-12-21 ENCOUNTER — Other Ambulatory Visit: Payer: Self-pay | Admitting: Internal Medicine

## 2021-12-21 DIAGNOSIS — R911 Solitary pulmonary nodule: Secondary | ICD-10-CM

## 2021-12-26 DIAGNOSIS — E785 Hyperlipidemia, unspecified: Secondary | ICD-10-CM | POA: Diagnosis not present

## 2021-12-26 DIAGNOSIS — M8580 Other specified disorders of bone density and structure, unspecified site: Secondary | ICD-10-CM | POA: Diagnosis not present

## 2021-12-26 DIAGNOSIS — I10 Essential (primary) hypertension: Secondary | ICD-10-CM | POA: Diagnosis not present

## 2022-01-15 ENCOUNTER — Other Ambulatory Visit: Payer: Self-pay | Admitting: Surgery

## 2022-01-15 DIAGNOSIS — R1013 Epigastric pain: Secondary | ICD-10-CM

## 2022-01-17 ENCOUNTER — Other Ambulatory Visit: Payer: Medicare Other

## 2022-01-23 ENCOUNTER — Encounter: Payer: Self-pay | Admitting: Internal Medicine

## 2022-01-23 ENCOUNTER — Ambulatory Visit (INDEPENDENT_AMBULATORY_CARE_PROVIDER_SITE_OTHER): Payer: Medicare Other | Admitting: Internal Medicine

## 2022-01-23 VITALS — BP 120/70 | HR 74 | Ht 63.75 in | Wt 137.0 lb

## 2022-01-23 DIAGNOSIS — L821 Other seborrheic keratosis: Secondary | ICD-10-CM | POA: Diagnosis not present

## 2022-01-23 DIAGNOSIS — B078 Other viral warts: Secondary | ICD-10-CM | POA: Diagnosis not present

## 2022-01-23 DIAGNOSIS — D225 Melanocytic nevi of trunk: Secondary | ICD-10-CM | POA: Diagnosis not present

## 2022-01-23 DIAGNOSIS — L570 Actinic keratosis: Secondary | ICD-10-CM | POA: Diagnosis not present

## 2022-01-23 DIAGNOSIS — Z85828 Personal history of other malignant neoplasm of skin: Secondary | ICD-10-CM | POA: Diagnosis not present

## 2022-01-23 DIAGNOSIS — D692 Other nonthrombocytopenic purpura: Secondary | ICD-10-CM | POA: Diagnosis not present

## 2022-01-23 DIAGNOSIS — I10 Essential (primary) hypertension: Secondary | ICD-10-CM

## 2022-01-23 NOTE — Patient Instructions (Signed)
Medication Instructions:  ? ?*If you need a refill on your cardiac medications before your next appointment, please call your pharmacy* ? ? ?Lab Work: ? ?If you have labs (blood work) drawn today and your tests are completely normal, you will receive your results only by: ?MyChart Message (if you have MyChart) OR ?A paper copy in the mail ?If you have any lab test that is abnormal or we need to change your treatment, we will call you to review the results. ? ? ?Testing/Procedures: ? ? ? ?Follow-Up: ?At [facility], you and your health needs are our priority.  As part of our continuing mission to provide you with exceptional heart care, we have created designated Provider Care Teams.  These Care Teams include your primary Cardiologist (physician) and Advanced Practice Providers (APPs -  Physician Assistants and Nurse Practitioners) who all work together to provide you with the care you need, when you need it. ? ?We recommend signing up for the patient portal called "MyChart".  Sign up information is provided on this After Visit Summary.  MyChart is used to connect with patients for Virtual Visits (Telemedicine).  Patients are able to view lab/test results, encounter notes, upcoming appointments, etc.  Non-urgent messages can be sent to your provider as well.   ?To learn more about what you can do with MyChart, go to [email or web address].   ? ?Your next appointment:   ?1 year(s) ? ?The format for your next appointment:   ?In Person ? ?Provider:   ?Paula Ross, MD   ? ? ?Other Instructions ? ? ?Important Information About Sugar ? ? ? ? ?  ?

## 2022-01-23 NOTE — Progress Notes (Signed)
Cardiology Office Note   Date:  01/23/2022   ID:  Sabrina Parks, DOB 01-Jun-1941, MRN [id number]  PCP:  Ginger Organ., MD  Cardiologist:   Dorris Carnes, MD   F/U of HTN     History of Present Illness: Sabrina Parks is a 81 y.o. female with a history of HTN, HL and palpitations and one episode of global amnesia (2013).  Stress test 2011 negaitve for ischemia.  Echo Nov 2013 mild LVH.  LVEF 65 to 38% with Gr I diastlic dysfunc.   MRA neg  Carotid USN neg  In 2018 EKG showed new  LBBB  Myovue done which was  normal  The pt also had  one episode of syncope  Had ILR and is followed by Beckie Salts    I last saw the pt in Aug 2021   She was seen by Beckie Salts since in Nov 2021  Since seen the pt denies CP   No dizziness   Remains active    No SOB   Occasional palpitations  Short lived  Current Meds  Medication Sig   acetaminophen (TYLENOL) 500 MG tablet Take 500 mg by mouth every 6 (six) hours as needed for moderate pain or headache.   amitriptyline (ELAVIL) 25 MG tablet Take 25 mg by mouth at bedtime.    aspirin EC 81 MG tablet Take 81 mg by mouth at bedtime.   Biotin 1 MG CAPS 3 (three) times a week.   Cholecalciferol (VITAMIN D) 50 MCG (2000 UT) tablet Take 2,000 Units by mouth daily.   clobetasol ointment (TEMOVATE) 1.01 % Apply 1 application topically 2 (two) times daily. Apply as directed twice daily.  Do not use for more than 5 days.   estradiol (ESTRACE VAGINAL) 0.1 MG/GM vaginal cream Apply pea size amount topically daily.   famotidine (PEPCID) 10 MG tablet Take 10 mg by mouth daily as needed for heartburn or indigestion.   ibuprofen (ADVIL,MOTRIN) 200 MG tablet Take 200 mg by mouth every 8 (eight) hours as needed for headache or moderate pain.    levothyroxine (SYNTHROID, LEVOTHROID) 50 MCG tablet Take 50 mcg by mouth daily before breakfast.   LORazepam (ATIVAN) 0.5 MG tablet Take 0.5 mg by mouth daily as needed for anxiety.    mometasone (ELOCON) 0.1 % ointment Apply topically  twice weekly   olmesartan (BENICAR) 40 MG tablet Take 40 mg by mouth at bedtime.    omeprazole (PRILOSEC) 20 MG capsule Take 20 mg by mouth daily.   ondansetron (ZOFRAN) 4 MG tablet Take 4 mg by mouth every 8 (eight) hours as needed for nausea/vomiting.   PROAIR HFA 108 (90 BASE) MCG/ACT inhaler Inhale 2 puffs into the lungs every 6 (six) hours as needed for wheezing or shortness of breath.    rosuvastatin (CRESTOR) 10 MG tablet Take 10 mg by mouth at bedtime.   triamcinolone (NASACORT) 55 MCG/ACT AERO nasal inhaler Place 1 spray into the nose daily as needed (for allergies).   triamterene-hydrochlorothiazide (MAXZIDE-25) 37.5-25 MG tablet TAKE ONE-HALF (1/2) TABLET DAILY (Patient taking differently: Take 0.5 tablets by mouth daily.)     Allergies:   Atorvastatin, Codeine, Morphine, Morphine and related, Other, and Sulfa antibiotics   Past Medical History:  Diagnosis Date   Alopecia    Arthritis    Complication of anesthesia    DJD (degenerative joint disease), cervical 5/02   [facility]   GERD (gastroesophageal reflux disease)    Headache    HSV-1 infection  Hyperlipidemia    Hypertension    Hypothyroidism    Lichen sclerosus 00/93/8182   biopsy proven   Liver hemangioma 2011   Dr. Earlean Shawl   PONV (postoperative nausea and vomiting)    Raynaud's disease    Thyroiditis    Transient global amnesia    not fully diag    Past Surgical History:  Procedure Laterality Date   APPENDECTOMY  1956   CARDIOVASCULAR STRESS TEST  10/29/2006   EF 78%.    CATARACT EXTRACTION Bilateral    CYSTOCELE REPAIR N/A 12/29/2018   Procedure: exam under anesthesia;  Surgeon: Bjorn Loser, MD;  [facility];  Service: Urology;  Laterality: N/A;   LOOP RECORDER INSERTION N/A 05/01/2017   Procedure: LOOP RECORDER INSERTION;  Surgeon: Evans Lance, MD;  Location: [facility];  Service: Cardiovascular;  Laterality: N/A;   LUMBAR LAMINECTOMY/DECOMPRESSION MICRODISCECTOMY N/A 09/25/2020    Procedure: Lumbar two-three Lumbar three-four Lumbar four-five  Laminectomy and foraminotomy;  Surgeon: Kristeen Miss, MD;  Location: [facility];  Service: Neurosurgery;  Laterality: N/A;   ROBOTIC ASSISTED LAPAROSCOPIC SACROCOLPOPEXY N/A 02/25/2019   Procedure: XI ROBOTIC ASSISTED LAPAROSCOPIC SACROCOLPOPEXY;  Surgeon: [name], MD;  [facility];  Service: Urology;  Laterality: N/A;   ROTATOR CUFF REPAIR Bilateral    SHOULDER SURGERY     TONSILLECTOMY AND ADENOIDECTOMY  1957   TOTAL ABDOMINAL HYSTERECTOMY W/ BILATERAL SALPINGOOPHORECTOMY  7/90   US ECHOCARDIOGRAPHY  10/11/2009   EF 55-60%     Social History:  The patient  reports that she quit smoking about 52 years ago. Her smoking use included cigarettes. She has never used smokeless tobacco. She reports current alcohol use. She reports that she does not use drugs.   Family History:  The patient's family history includes Cancer in her father; Colon cancer in her father; Stroke in her mother.    ROS:  Please see the history of present illness. All other systems are reviewed and  Negative to the above problem except as noted.    PHYSICAL EXAM: VS:  BP 120/70   Pulse 74   Ht 5' 3.75" (1.619 m)   Wt 137 lb (62.1 kg)   LMP 07/29/1990   SpO2 94%   BMI 23.70 kg/m    GEN: Well nourished, well developed, in no acute distress  HEENT: normal  Neck: JVP normal  No carotid bruits Cardiac: RRR; no murmurs;  No LE edema  Respiratory:  clear to auscultation bilaterally GI: soft, nontender, nondistended, + BS  No hepatomegaly  MS: no deformity Moving all extremities   Skin: warm and dry, no rash  Pt wearing hat had Bx on scalp Neuro:  Strength and sensation are intact Psych: euthymic mood, full affect   EKG:  EKG is  done  NSR  74 bpm  LBBB  Lipid Panel    Component Value Date/Time   CHOL 224 (H) 03/27/2020 1158   TRIG 152 (H) 03/27/2020 1158   HDL 91 03/27/2020 1158   CHOLHDL 2.5 03/27/2020 1158   LDLCALC 107 (H)  03/27/2020 1158      Wt Readings from Last 3 Encounters:  01/23/22 137 lb (62.1 kg)  08/29/21 137 lb 3.2 oz (62.2 kg)  07/16/21 135 lb 12.8 oz (61.6 kg)      ASSESSMENT AND PLAN:  1  HTN   BP is controlled  Continue to follow on current meds    2  Dizziness/ syncope   Pt had a ILR in past  that never documented a problem    Denies symptoms      3  Palpitations  / heart racing     Denies   4   LBBB  Old    5  HL  LDL 105, in 2022  Will get labs from Dr Brigitte Pulse     I encouraged her to get back to water aerobics  She loved it in the past     F/U in clinicin about 12 months   Current medicines are reviewed at length with the patient today.  The patient does not have concerns regarding medicines.  Signed, Dorris Carnes, MD  01/23/2022 2:37 PM    [address], [place], [place]  [address] Phone: [phone]; Fax: [phone]

## 2022-02-08 ENCOUNTER — Ambulatory Visit
Admission: RE | Admit: 2022-02-08 | Discharge: 2022-02-08 | Disposition: A | Discharge status: Home / Self Care | Payer: Medicare Other | Source: Ambulatory Visit | Attending: Surgery | Admitting: Surgery

## 2022-02-08 ENCOUNTER — Ambulatory Visit
Admission: RE | Admit: 2022-02-08 | Discharge: 2022-02-08 | Disposition: A | Discharge status: Home / Self Care | Payer: Medicare Other | Source: Ambulatory Visit | Attending: Internal Medicine | Admitting: Internal Medicine

## 2022-02-08 DIAGNOSIS — I700 Atherosclerosis of aorta: Secondary | ICD-10-CM | POA: Diagnosis not present

## 2022-02-08 DIAGNOSIS — K769 Liver disease, unspecified: Secondary | ICD-10-CM | POA: Diagnosis not present

## 2022-02-08 DIAGNOSIS — M160 Bilateral primary osteoarthritis of hip: Secondary | ICD-10-CM | POA: Diagnosis not present

## 2022-02-08 DIAGNOSIS — K449 Diaphragmatic hernia without obstruction or gangrene: Secondary | ICD-10-CM | POA: Diagnosis not present

## 2022-02-08 DIAGNOSIS — K5730 Diverticulosis of large intestine without perforation or abscess without bleeding: Secondary | ICD-10-CM | POA: Diagnosis not present

## 2022-02-08 DIAGNOSIS — R911 Solitary pulmonary nodule: Secondary | ICD-10-CM

## 2022-02-08 DIAGNOSIS — I3139 Other pericardial effusion (noninflammatory): Secondary | ICD-10-CM | POA: Diagnosis not present

## 2022-02-08 DIAGNOSIS — R1013 Epigastric pain: Secondary | ICD-10-CM

## 2022-02-08 MED ORDER — IOPAMIDOL (ISOVUE-300) INJECTION 61%
75.0000 mL | Freq: Once | INTRAVENOUS | Status: AC | PRN
  Administered 2022-02-08: 75 mL via INTRAVENOUS

## 2022-02-13 DIAGNOSIS — M48062 Spinal stenosis, lumbar region with neurogenic claudication: Secondary | ICD-10-CM | POA: Diagnosis not present

## 2022-02-19 DIAGNOSIS — L089 Local infection of the skin and subcutaneous tissue, unspecified: Secondary | ICD-10-CM | POA: Diagnosis not present

## 2022-02-19 DIAGNOSIS — T148XXA Other injury of unspecified body region, initial encounter: Secondary | ICD-10-CM | POA: Diagnosis not present

## 2022-02-19 DIAGNOSIS — W548XXA Other contact with dog, initial encounter: Secondary | ICD-10-CM | POA: Diagnosis not present

## 2022-02-19 DIAGNOSIS — E039 Hypothyroidism, unspecified: Secondary | ICD-10-CM | POA: Diagnosis not present

## 2022-02-19 DIAGNOSIS — I10 Essential (primary) hypertension: Secondary | ICD-10-CM | POA: Diagnosis not present

## 2022-02-19 DIAGNOSIS — Z1231 Encounter for screening mammogram for malignant neoplasm of breast: Secondary | ICD-10-CM | POA: Diagnosis not present

## 2022-02-22 DIAGNOSIS — T148XXD Other injury of unspecified body region, subsequent encounter: Secondary | ICD-10-CM | POA: Diagnosis not present

## 2022-02-22 DIAGNOSIS — L089 Local infection of the skin and subcutaneous tissue, unspecified: Secondary | ICD-10-CM | POA: Diagnosis not present

## 2022-02-25 DIAGNOSIS — H353211 Exudative age-related macular degeneration, right eye, with active choroidal neovascularization: Secondary | ICD-10-CM | POA: Diagnosis not present

## 2022-02-26 DIAGNOSIS — W548XXA Other contact with dog, initial encounter: Secondary | ICD-10-CM | POA: Diagnosis not present

## 2022-02-26 DIAGNOSIS — L089 Local infection of the skin and subcutaneous tissue, unspecified: Secondary | ICD-10-CM | POA: Diagnosis not present

## 2022-02-26 DIAGNOSIS — T148XXA Other injury of unspecified body region, initial encounter: Secondary | ICD-10-CM | POA: Diagnosis not present

## 2022-02-27 DIAGNOSIS — R928 Other abnormal and inconclusive findings on diagnostic imaging of breast: Secondary | ICD-10-CM | POA: Diagnosis not present

## 2022-02-28 DIAGNOSIS — M5117 Intervertebral disc disorders with radiculopathy, lumbosacral region: Secondary | ICD-10-CM | POA: Diagnosis not present

## 2022-02-28 DIAGNOSIS — M5417 Radiculopathy, lumbosacral region: Secondary | ICD-10-CM | POA: Diagnosis not present

## 2022-03-04 DIAGNOSIS — L089 Local infection of the skin and subcutaneous tissue, unspecified: Secondary | ICD-10-CM | POA: Diagnosis not present

## 2022-03-04 DIAGNOSIS — T148XXD Other injury of unspecified body region, subsequent encounter: Secondary | ICD-10-CM | POA: Diagnosis not present

## 2022-03-11 DIAGNOSIS — L089 Local infection of the skin and subcutaneous tissue, unspecified: Secondary | ICD-10-CM | POA: Diagnosis not present

## 2022-03-11 DIAGNOSIS — T148XXD Other injury of unspecified body region, subsequent encounter: Secondary | ICD-10-CM | POA: Diagnosis not present

## 2022-03-18 DIAGNOSIS — T148XXD Other injury of unspecified body region, subsequent encounter: Secondary | ICD-10-CM | POA: Diagnosis not present

## 2022-03-18 DIAGNOSIS — L089 Local infection of the skin and subcutaneous tissue, unspecified: Secondary | ICD-10-CM | POA: Diagnosis not present

## 2022-04-09 ENCOUNTER — Ambulatory Visit: Payer: Medicare Other | Admitting: Internal Medicine

## 2022-04-10 DIAGNOSIS — H35363 Drusen (degenerative) of macula, bilateral: Secondary | ICD-10-CM | POA: Diagnosis not present

## 2022-04-10 DIAGNOSIS — H353122 Nonexudative age-related macular degeneration, left eye, intermediate dry stage: Secondary | ICD-10-CM | POA: Diagnosis not present

## 2022-04-10 DIAGNOSIS — Z961 Presence of intraocular lens: Secondary | ICD-10-CM | POA: Diagnosis not present

## 2022-04-10 DIAGNOSIS — H353211 Exudative age-related macular degeneration, right eye, with active choroidal neovascularization: Secondary | ICD-10-CM | POA: Diagnosis not present

## 2022-04-10 DIAGNOSIS — H35453 Secondary pigmentary degeneration, bilateral: Secondary | ICD-10-CM | POA: Diagnosis not present

## 2022-04-24 DIAGNOSIS — Z23 Encounter for immunization: Secondary | ICD-10-CM | POA: Diagnosis not present

## 2022-04-30 DIAGNOSIS — B079 Viral wart, unspecified: Secondary | ICD-10-CM | POA: Diagnosis not present

## 2022-04-30 DIAGNOSIS — L570 Actinic keratosis: Secondary | ICD-10-CM | POA: Diagnosis not present

## 2022-04-30 DIAGNOSIS — Z85828 Personal history of other malignant neoplasm of skin: Secondary | ICD-10-CM | POA: Diagnosis not present

## 2022-04-30 DIAGNOSIS — L603 Nail dystrophy: Secondary | ICD-10-CM | POA: Diagnosis not present

## 2022-04-30 DIAGNOSIS — D485 Neoplasm of uncertain behavior of skin: Secondary | ICD-10-CM | POA: Diagnosis not present

## 2022-04-30 DIAGNOSIS — L820 Inflamed seborrheic keratosis: Secondary | ICD-10-CM | POA: Diagnosis not present

## 2022-05-01 DIAGNOSIS — H353211 Exudative age-related macular degeneration, right eye, with active choroidal neovascularization: Secondary | ICD-10-CM | POA: Diagnosis not present

## 2022-05-02 DIAGNOSIS — H9113 Presbycusis, bilateral: Secondary | ICD-10-CM | POA: Diagnosis not present

## 2022-05-04 DIAGNOSIS — Z23 Encounter for immunization: Secondary | ICD-10-CM | POA: Diagnosis not present

## 2022-05-08 IMAGING — MR MR HEAD WO/W CM
13 series · 48 of 48 positions shown · IV contrast (13ML MULTI)
Comparison: 10/20/2019

CLINICAL DATA: Pain on the left side of head for 4 months. Symptoms
on and off

EXAM:
MRI HEAD WITHOUT AND WITH CONTRAST
TECHNIQUE: Multiplanar, multiecho pulse sequences of the brain and surrounding
structures were obtained without and with intravenous contrast.
CONTRAST:  13mL MULTIHANCE GADOBENATE DIMEGLUMINE 529 MG/ML IV SOLN

[Series 5: T1 · sagittal · 4.0mm · 0.75mm/px · 2 of 31 slices shown (1 of 3)]
[im 1/31]
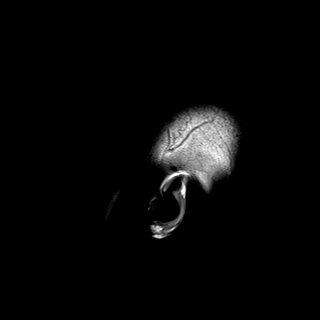
[im 31/31]
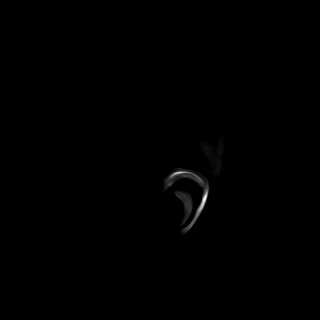

[Series 6: DWI · axial · 3.0mm · 0.94mm/px · z∈[-73,+72]mm · 8 of 168 slices shown (1 of 3)]
[im 1/168]
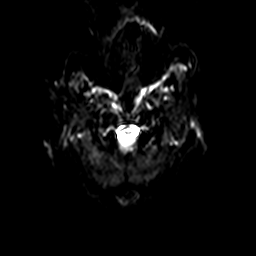
[im 24/168]
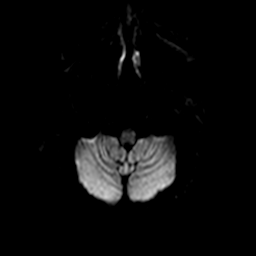
[im 48/168]
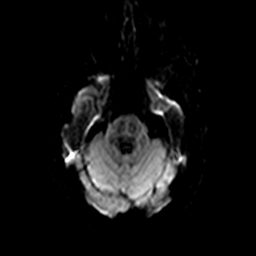
[im 72/168]
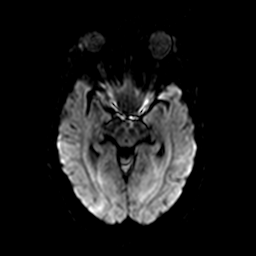
[im 96/168]
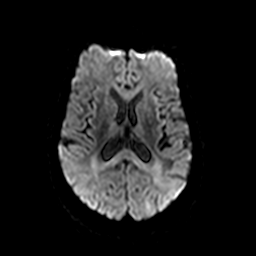
[im 120/168]
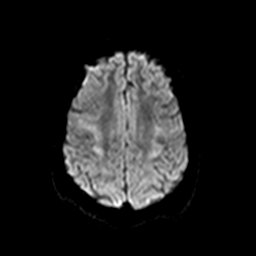
[im 144/168]
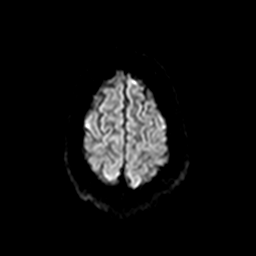
[im 168/168]
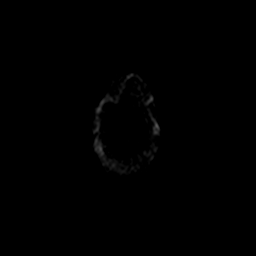

[Series 7: ax dwi_tracew · axial · 3.0mm · 0.94mm/px · z∈[-73,+72]mm · 4 of 84 slices shown]
[im 1/84]
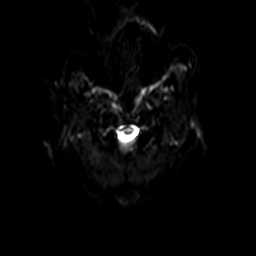
[im 28/84]
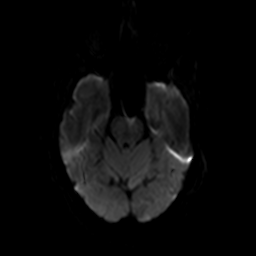
[im 56/84]
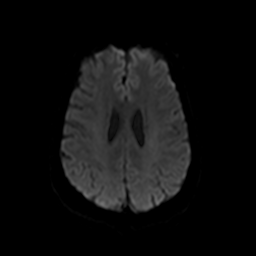
[im 84/84]
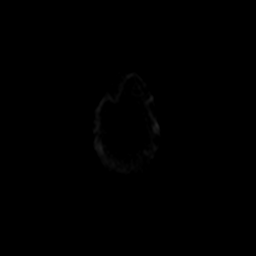

[Series 8: ax dwi_adc · axial · 3.0mm · 0.94mm/px · z∈[-73,+72]mm · 2 of 41 slices shown]
[im 1/41]
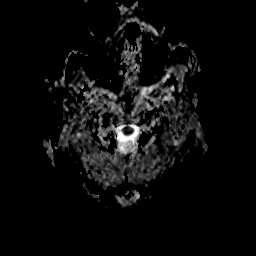
[im 41/41]
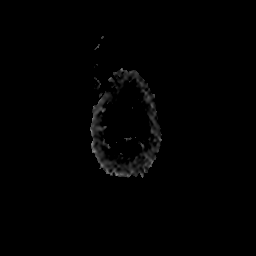

[Series 9: T2 · axial · 4.0mm · 0.36mm/px · 1 of 29 slices shown]
[im 1/29]
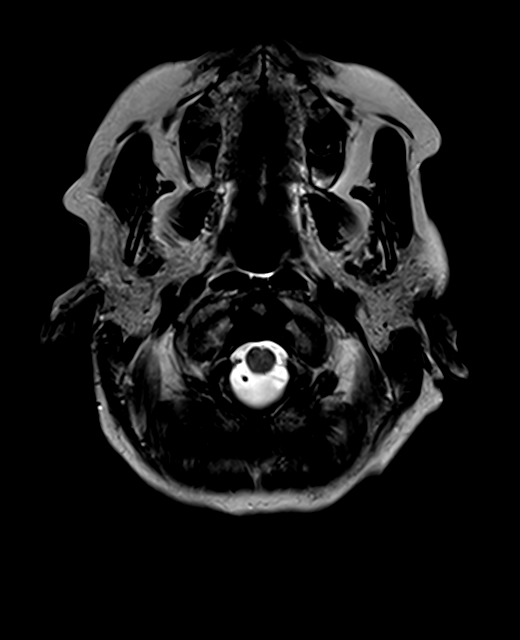

[Series 10: DWI · coronal · 5.0mm · 1.44mm/px · 3 of 68 slices shown (2 of 3)]
[im 1/68]
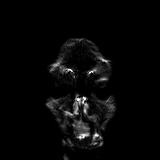
[im 34/68]
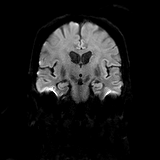
[im 68/68]
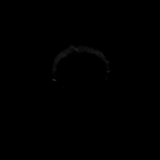

[Series 11: DWI · coronal · 5.0mm · 1.44mm/px · 2 of 34 slices shown (3 of 3)]
[im 1/34]
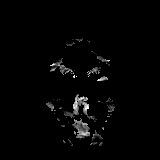
[im 34/34]
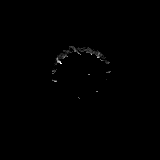

[Series 12: FLAIR · axial · 3.0mm · 0.72mm/px · 1 of 26 slices shown]
[im 1/26]
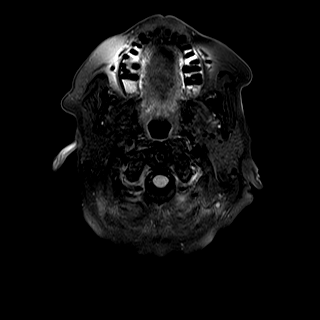

[Series 13: swi_images · axial · 1.5mm · 0.90mm/px · z∈[-60,+82]mm · 5 of 96 slices shown]
[im 1/96]
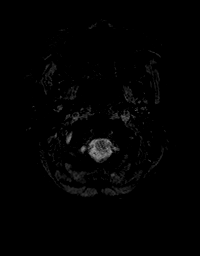
[im 24/96]
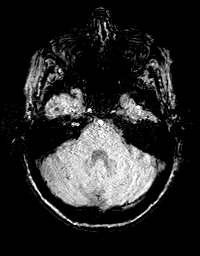
[im 48/96]
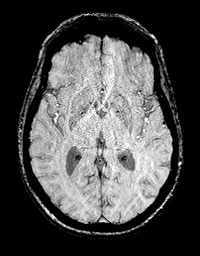
[im 72/96]
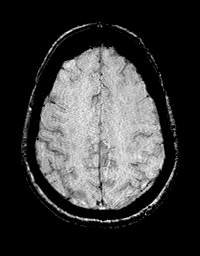
[im 96/96]
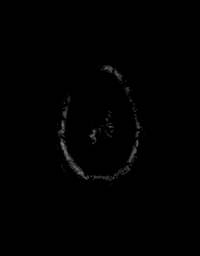

[Series 15: T1 · axial · 1.0mm · 0.94mm/px · z∈[-79,+78]mm · 8 of 160 slices shown (2 of 3)]
[im 1/160]
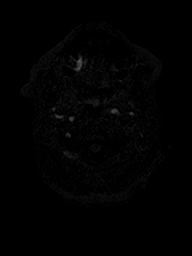
[im 23/160]
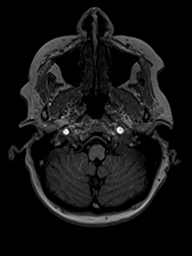
[im 46/160]
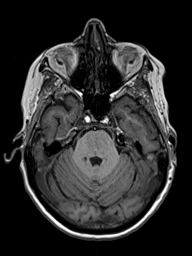
[im 69/160]
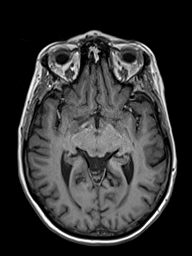
[im 91/160]
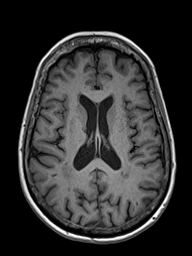
[im 114/160]
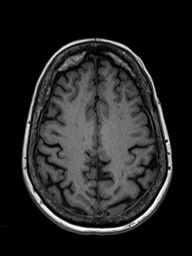
[im 137/160]
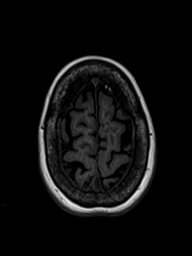
[im 160/160]
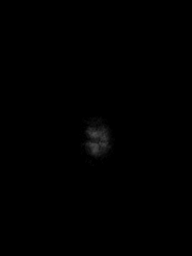

[Series 16: T2 post-contrast · coronal · 4.0mm · 0.36mm/px · 2 of 35 slices shown]
[im 1/35]
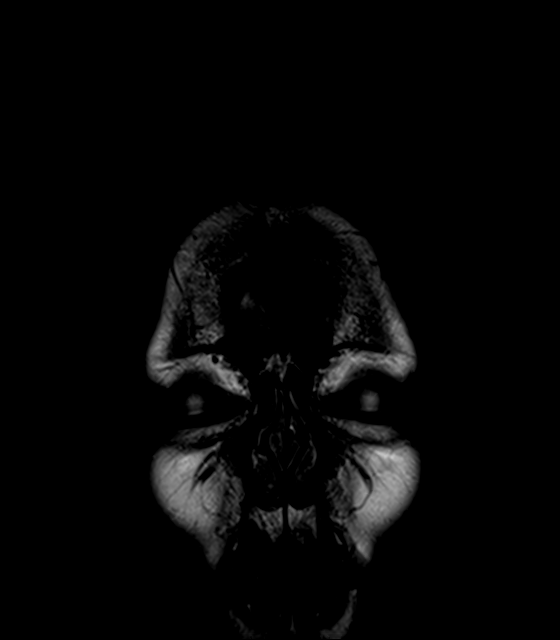
[im 35/35]
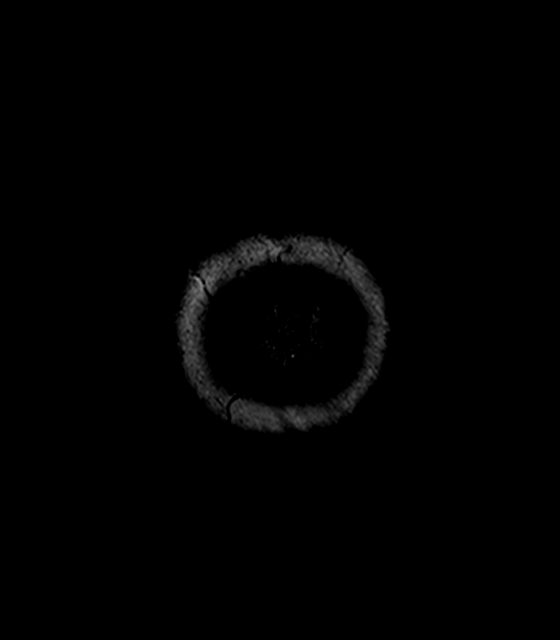

[Series 17: T1 · axial · 1.0mm · 0.94mm/px · z∈[-79,+78]mm · 8 of 160 slices shown (3 of 3)]
[im 1/160]
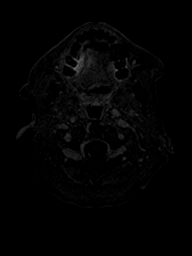
[im 23/160]
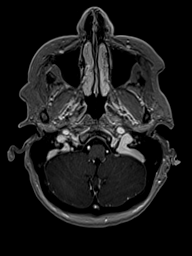
[im 46/160]
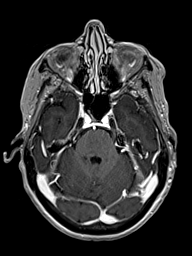
[im 69/160]
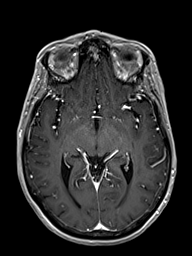
[im 91/160]
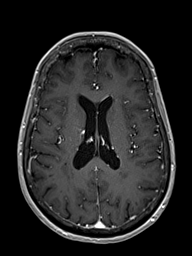
[im 114/160]
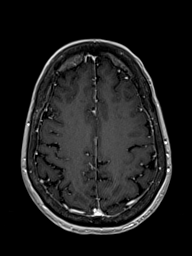
[im 137/160]
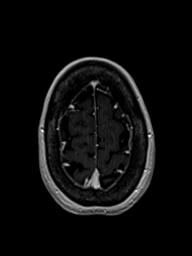
[im 160/160]
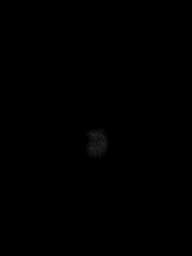

[Series 18: T1 post-contrast · coronal · 4.0mm · 0.72mm/px · 2 of 35 slices shown]
[im 1/35]
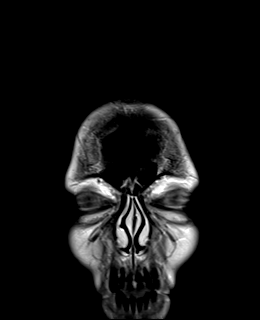
[im 35/35]
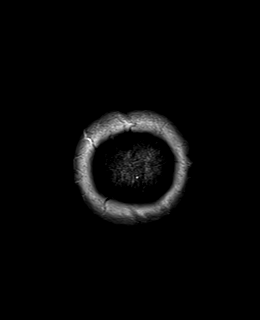

[48 of 48 positions shown; findings below may reference images not displayed]

FINDINGS: Brain: Mild for age chronic small vessel ischemia in the hemispheric
white matter. Preserved brain volume. No gray matter infarct,
hemorrhage, hydrocephalus, or collection.

Stable known meningiomas measuring 5 mm at the left CP angle
cistern, just above and posterior to the porous acousticus, and 4 mm
along the right para median floor of the anterior cranial fossa. No
associated brain mass effect or edema.

Vascular: Normal flow voids and vessel enhancements.

Skull and upper cervical spine: Normal marrow signal

Sinuses/Orbits: Chronic fluid signal within the right petrous apex
aerated cells without restricted diffusion, expansion, or
superimposed abnormal enhancement.
IMPRESSION: 1. Stable compared to 3932. No new or specific cause for interval
headaches.
2. Subcentimeter meningiomas in the left CP angle cistern and right
anterior cranial fossa.
3. Mild chronic small vessel ischemia, typical for age.

## 2022-05-21 IMAGING — US US ABDOMEN LIMITED
1 series · 14 of 25 positions shown · non-contrast
Comparison: 08/17/2015

CLINICAL DATA: Elevated alk-phos

EXAM:
ULTRASOUND ABDOMEN LIMITED RIGHT UPPER QUADRANT

[Series 1: us abdomen limited ruq (liver/gb) · 14 of 50 slices shown]
[im 1/50]
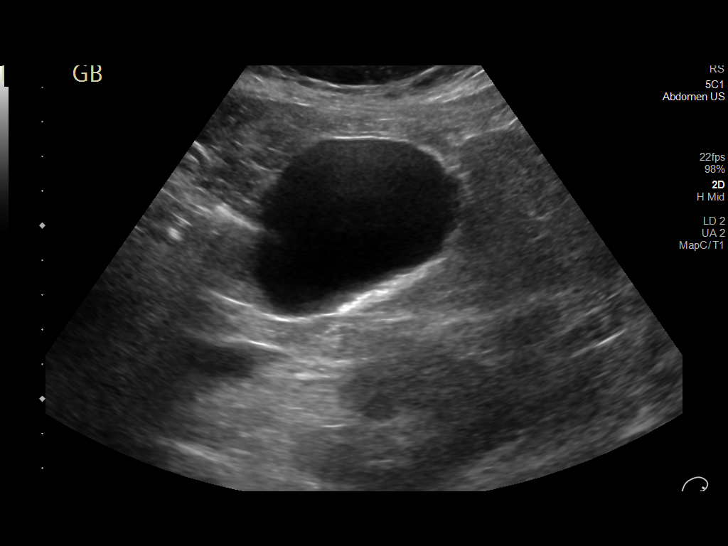
[im 5/50]
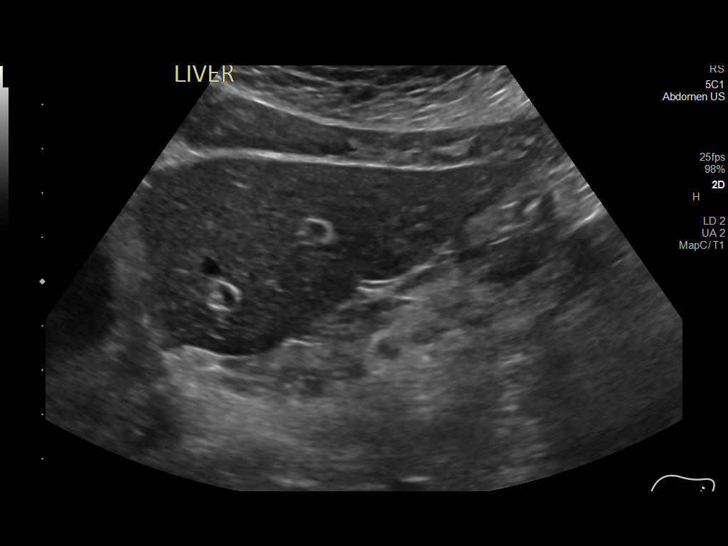
[im 9/50]
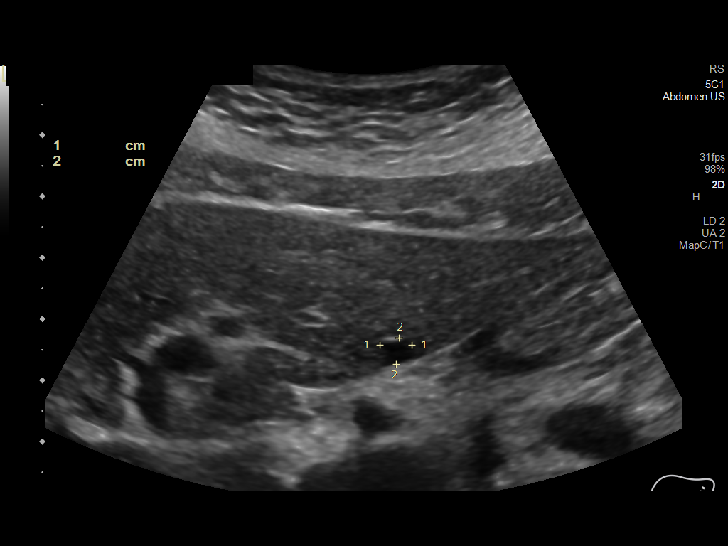
[im 13/50]
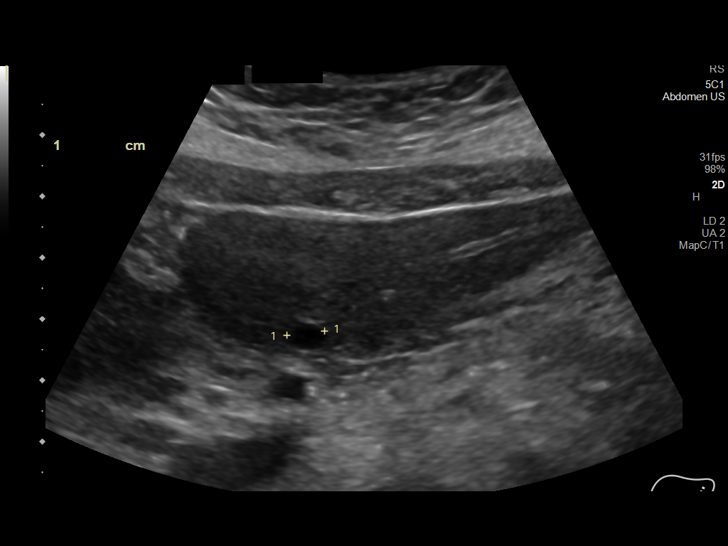
[im 17/50]
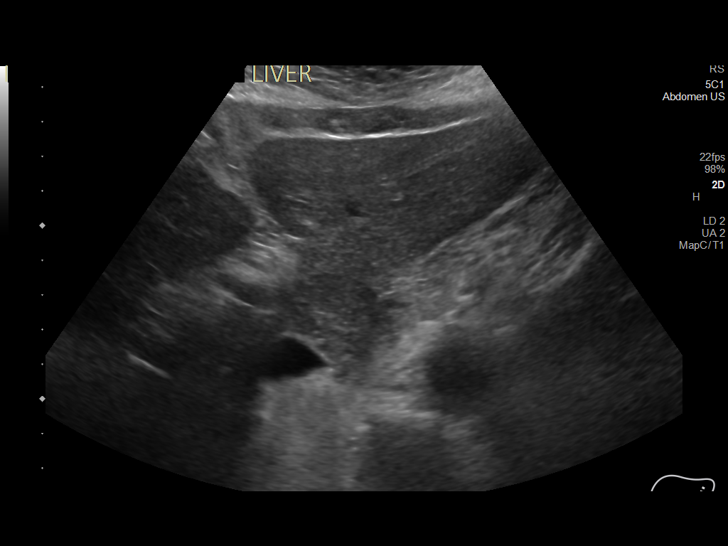
[im 19/50]
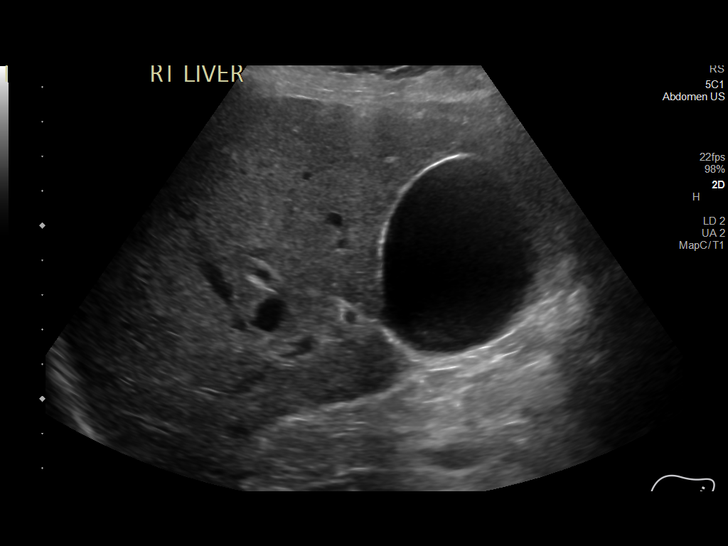
[im 23/50]
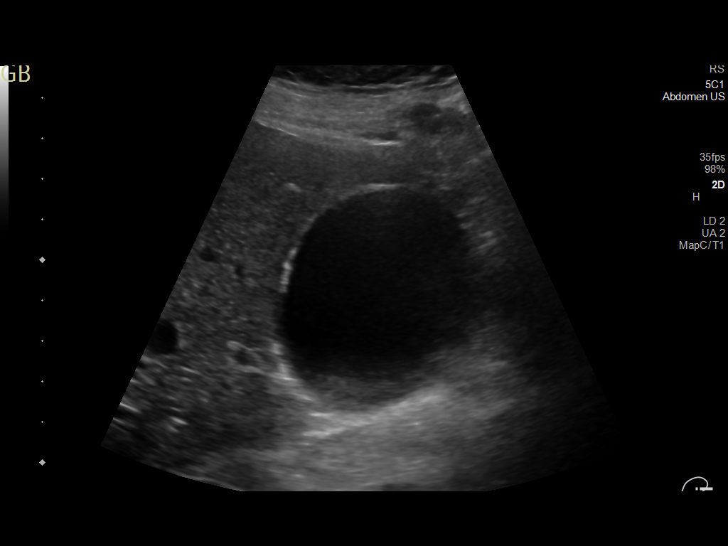
[im 27/50]
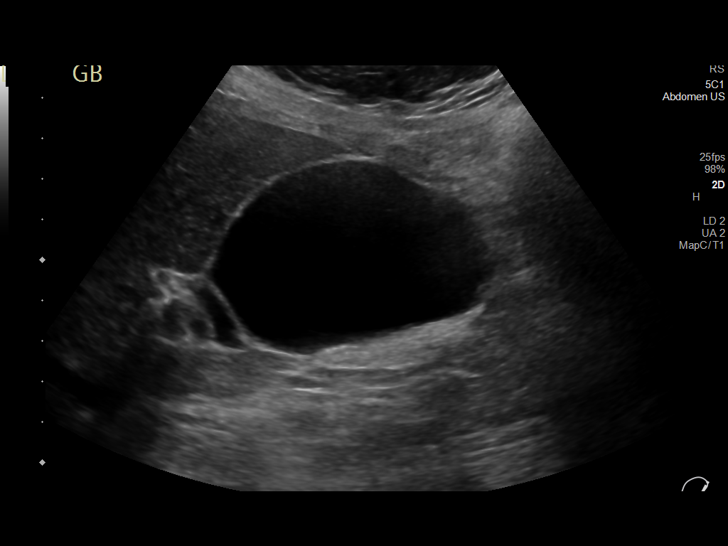
[im 31/50]
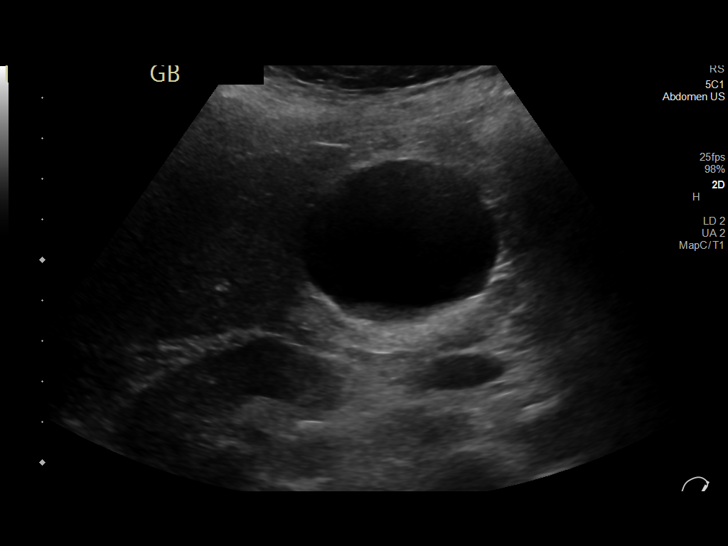
[im 33/50]
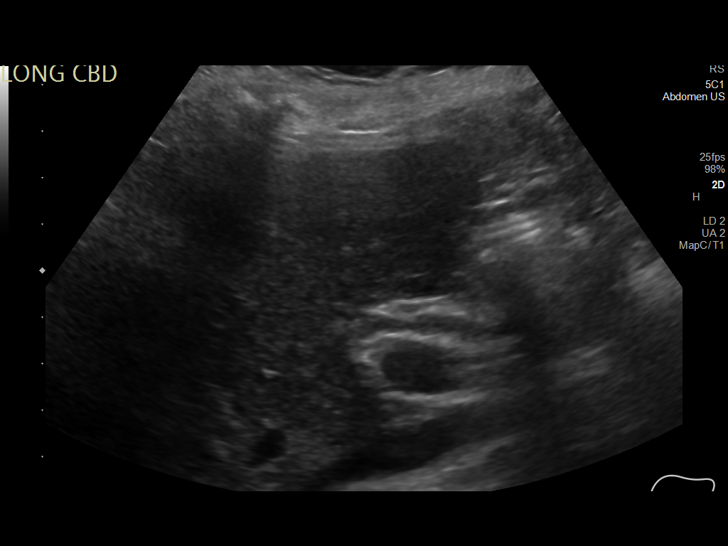
[im 37/50]
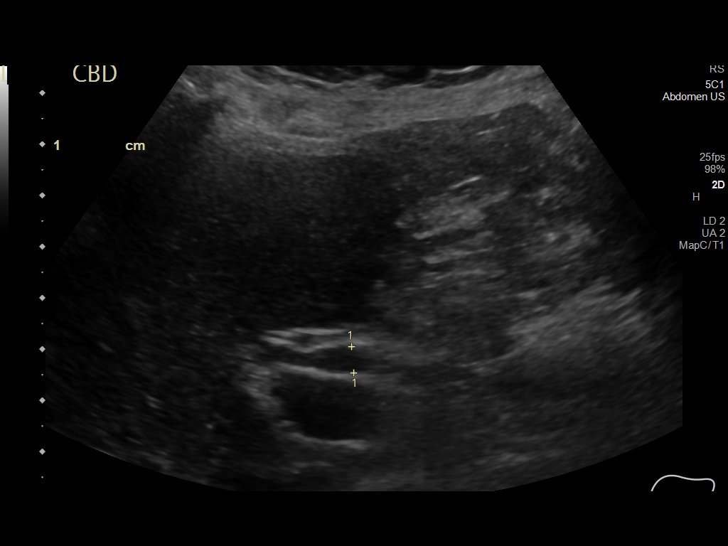
[im 41/50]
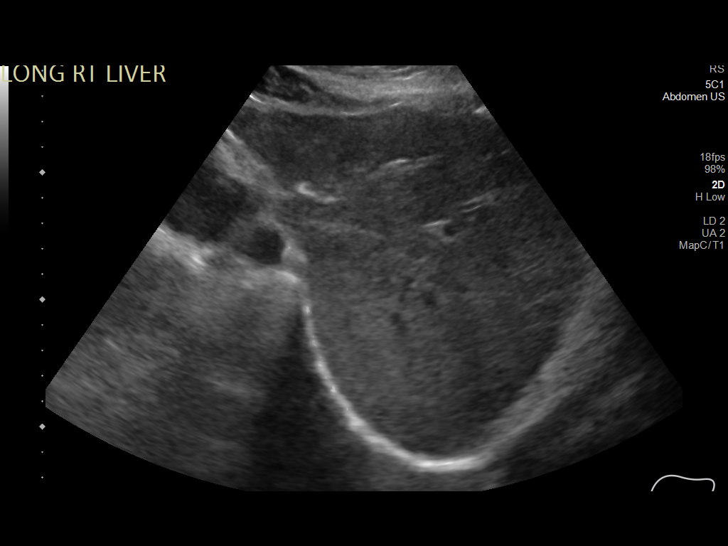
[im 45/50]
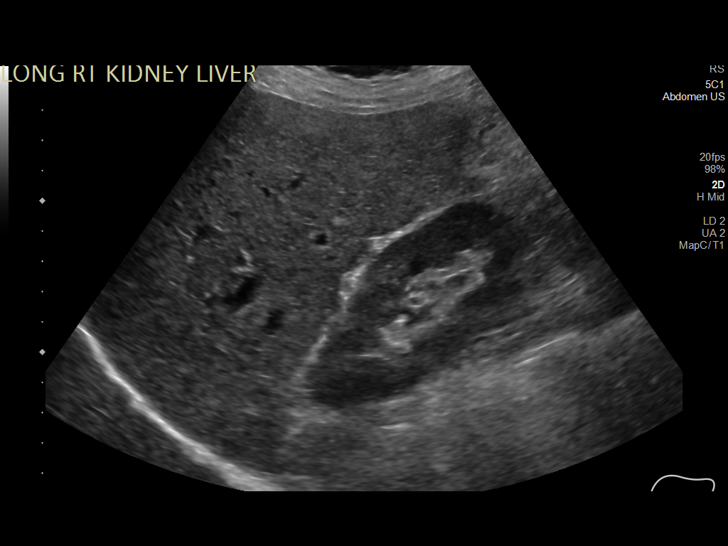
[im 50/50]
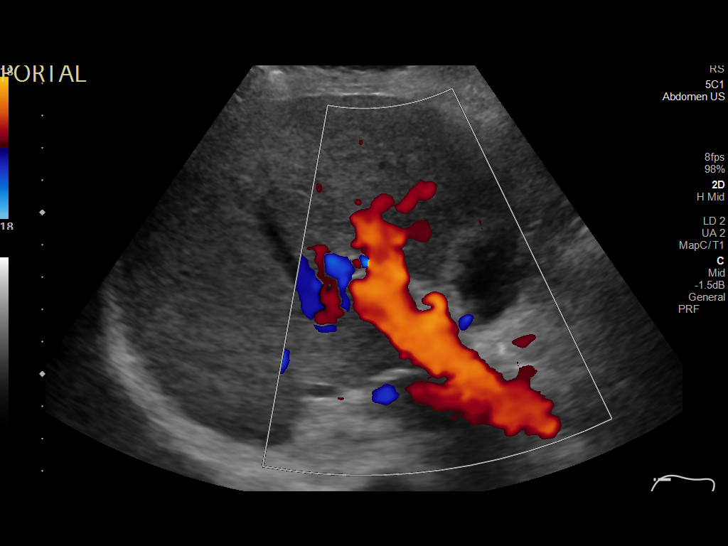

[14 of 25 positions shown; findings below may reference images not displayed]

FINDINGS: Gallbladder:

No gallstones or wall thickening visualized. Small amount of sludge
is present. No sonographic Murphy sign noted by sonographer.

Common bile duct:

Diameter: 5 mm

Liver:

No focal lesion identified. Within normal limits in parenchymal
echogenicity. 6 mm simple cyst in the left lower lobe. Portal vein
is patent on color Doppler imaging with normal direction of blood
flow towards the liver.

Other: None.
IMPRESSION: Small amount of sludge present within the gallbladder lumen without
additional sonographic evidence of acute cholecystitis. No
significant abnormality of the liver.

## 2022-05-29 DIAGNOSIS — N3000 Acute cystitis without hematuria: Secondary | ICD-10-CM | POA: Diagnosis not present

## 2022-06-05 DIAGNOSIS — Z961 Presence of intraocular lens: Secondary | ICD-10-CM | POA: Diagnosis not present

## 2022-06-05 DIAGNOSIS — H353122 Nonexudative age-related macular degeneration, left eye, intermediate dry stage: Secondary | ICD-10-CM | POA: Diagnosis not present

## 2022-06-05 DIAGNOSIS — H35721 Serous detachment of retinal pigment epithelium, right eye: Secondary | ICD-10-CM | POA: Diagnosis not present

## 2022-06-05 DIAGNOSIS — H353211 Exudative age-related macular degeneration, right eye, with active choroidal neovascularization: Secondary | ICD-10-CM | POA: Diagnosis not present

## 2022-06-05 DIAGNOSIS — H35453 Secondary pigmentary degeneration, bilateral: Secondary | ICD-10-CM | POA: Diagnosis not present

## 2022-06-05 DIAGNOSIS — H35363 Drusen (degenerative) of macula, bilateral: Secondary | ICD-10-CM | POA: Diagnosis not present

## 2022-06-25 DIAGNOSIS — N644 Mastodynia: Secondary | ICD-10-CM | POA: Diagnosis not present

## 2022-07-02 ENCOUNTER — Other Ambulatory Visit: Payer: Self-pay | Admitting: Family Medicine

## 2022-07-02 DIAGNOSIS — N644 Mastodynia: Secondary | ICD-10-CM

## 2022-07-04 ENCOUNTER — Other Ambulatory Visit: Payer: Self-pay | Admitting: Internal Medicine

## 2022-07-04 DIAGNOSIS — N289 Disorder of kidney and ureter, unspecified: Secondary | ICD-10-CM

## 2022-07-04 DIAGNOSIS — H353211 Exudative age-related macular degeneration, right eye, with active choroidal neovascularization: Secondary | ICD-10-CM | POA: Diagnosis not present

## 2022-07-10 ENCOUNTER — Ambulatory Visit (INDEPENDENT_AMBULATORY_CARE_PROVIDER_SITE_OTHER): Payer: Medicare Other

## 2022-07-10 ENCOUNTER — Ambulatory Visit: Payer: Self-pay

## 2022-07-10 ENCOUNTER — Ambulatory Visit (INDEPENDENT_AMBULATORY_CARE_PROVIDER_SITE_OTHER): Payer: Medicare Other | Admitting: Orthopaedic Surgery

## 2022-07-10 ENCOUNTER — Encounter: Payer: Self-pay | Admitting: Orthopaedic Surgery

## 2022-07-10 DIAGNOSIS — M25561 Pain in right knee: Secondary | ICD-10-CM | POA: Diagnosis not present

## 2022-07-10 DIAGNOSIS — M170 Bilateral primary osteoarthritis of knee: Secondary | ICD-10-CM

## 2022-07-10 DIAGNOSIS — M25562 Pain in left knee: Secondary | ICD-10-CM

## 2022-07-10 MED ORDER — METHYLPREDNISOLONE ACETATE 40 MG/ML IJ SUSP
80.0000 mg | INTRAMUSCULAR | Status: AC | PRN
  Administered 2022-07-10: 80 mg via INTRA_ARTICULAR

## 2022-07-10 MED ORDER — LIDOCAINE HCL 1 % IJ SOLN
2.0000 mL | INTRAMUSCULAR | Status: AC | PRN
  Administered 2022-07-10: 2 mL

## 2022-07-10 MED ORDER — BUPIVACAINE HCL 0.25 % IJ SOLN
2.0000 mL | INTRAMUSCULAR | Status: AC | PRN
  Administered 2022-07-10: 2 mL via INTRA_ARTICULAR

## 2022-07-10 NOTE — Progress Notes (Signed)
Office Visit Note   Patient: Sabrina Parks           Date of Birth: 28-Oct-1940           MRN: [id number] Visit Date: 07/10/2022              Requested by: Ginger Organ., MD [address] [place],  [place] [address] PCP: Ginger Organ., MD   Assessment & Plan: Visit Diagnoses:  1. Acute pain of left knee   2. Acute pain of right knee   3. Primary osteoarthritis of both knees     Plan: Sabrina Parks comes in today with a chief complaint of bilateral knee pain.  Right side is more symptomatic than the left.  She thinks she has had an injection in her knee in the past.  This began approximately 3 to 4 weeks ago when she was kneeling on her knees doing some work.  When she got up she began having pain.  She has tried over-the-counter anti-inflammatories as well as topical Voltaren gel but still has pain.  X-rays demonstrate bilateral osteoarthritis especially in the medial compartment of both of her knees with CPPD.  We discussed options and she would like to try cortisone injection today.  We will do this in the right knee as this is the most symptomatic.  Can follow-up for an injection similarly into the left knee  Follow-Up Instructions: Return in about 3 weeks (around 07/31/2022).   Orders:  Orders Placed This Encounter  Procedures   Large Joint Inj: R knee   XR KNEE 3 VIEW RIGHT   XR KNEE 3 VIEW LEFT   No orders of the defined types were placed in this encounter.     Procedures: Large Joint Inj: R knee on 07/10/2022 2:46 PM Indications: pain and diagnostic evaluation Details: 25 G 1.5 in needle, anteromedial approach  Arthrogram: No  Medications: 80 mg methylPREDNISolone acetate 40 MG/ML; 2 mL lidocaine 1 %; 2 mL bupivacaine 0.25 % Outcome: tolerated well, no immediate complications Procedure, treatment alternatives, risks and benefits explained, specific risks discussed. Consent was given by the patient.      Clinical Data: No additional  findings.   Subjective: Chief Complaint  Patient presents with   Left Knee - Pain   Right Knee - Pain    HPI Sabrina Parks is a pleasant 81 year old woman with a chief complaint of bilateral knee pain.  She attributes this to being on her knees before Thanksgiving doing some decorating.  She does think she has had issues in the past and has had an injection in one of her knees.  No recent injury or trauma  Review of Systems  All other systems reviewed and are negative.    Objective: Vital Signs: LMP 07/29/1990   Physical Exam Constitutional:      Appearance: Normal appearance.  Pulmonary:     Effort: Pulmonary effort is normal.  Skin:    General: Skin is warm and dry.  Neurological:     General: No focal deficit present.     Mental Status: She is alert.     Ortho Exam Bilateral knees no erythema no effusion minimal soft tissue swelling.  Compartments are soft and nontender she is neurovascularly intact.  She has global pain in the knee with range of motion and patellar grinding.  Pain is greater in the right rather than the left.  Mostly medial joint pain.  No effusion.  Full extension of flex well over  100 degrees without instability.  No popliteal pain or mass Specialty Comments:  No specialty comments available.  Imaging: XR KNEE 3 VIEW RIGHT  Result Date: 07/10/2022 Three-view radiograph of her right knee demonstrate degenerative changes most noted in the medial compartment with subchondral sclerosis process.  She also has evidence of CPPD.  No osseous abnormalities. Mild varus similar to the left knee  XR KNEE 3 VIEW LEFT  Result Date: 07/10/2022 Three-view radiographs of her left knee demonstrate tricompartmental arthritis.  Most noted with joint space narrowing of the medial joint with subchondral sclerosis.  She does also have evidence of CPPD both in the medial and lateral compartments.  No acute osseous changes. Mild varus    PMFS History: Patient Active  Problem List   Diagnosis Date Noted   Unilateral primary osteoarthritis, right hip 11/22/2020   Lumbar stenosis with neurogenic claudication 09/25/2020   Trochanteric bursitis, right hip 04/18/2020   Osteoarthritis of knees, bilateral 03/01/2020   Pain due to onychomycosis of toenails of both feet 07/07/2019   Syncope 06/01/2019   Left bundle branch block 06/01/2019   Pelvic prolapse 02/25/2019   Pain in right hip 02/23/2019   Benign neoplasm of parotid gland 21/19/4174   Lichen sclerosus et atrophicus 06/14/2016   Hypothyroidism 06/14/2016   Hypertension    Hyperlipidemia    TIA (transient ischemic attack) 06/12/2012   Alopecia 08/01/2011   Past Medical History:  Diagnosis Date   Alopecia    Arthritis    Complication of anesthesia    DJD (degenerative joint disease), cervical 5/02   [facility]   GERD (gastroesophageal reflux disease)    Headache    HSV-1 infection    Hyperlipidemia    Hypertension    Hypothyroidism    Lichen sclerosus 03/11/4817   biopsy proven   Liver hemangioma 2011   Dr. Earlean Shawl   PONV (postoperative nausea and vomiting)    Raynaud's disease    Thyroiditis    Transient global amnesia    not fully diag    Family History  Problem Relation Age of Onset   Stroke Mother    Cancer Father    Colon cancer Father        71's    Past Surgical History:  Procedure Laterality Date   APPENDECTOMY  1956   CARDIOVASCULAR STRESS TEST  10/29/2006   EF 78%.    CATARACT EXTRACTION Bilateral    CYSTOCELE REPAIR N/A 12/29/2018   Procedure: exam under anesthesia;  Surgeon: Bjorn Loser, MD;  Location: [facility];  Service: Urology;  Laterality: N/A;   LOOP RECORDER INSERTION N/A 05/01/2017   Procedure: LOOP RECORDER INSERTION;  Surgeon: Evans Lance, MD;  Location: [facility];  Service: Cardiovascular;  Laterality: N/A;   LUMBAR LAMINECTOMY/DECOMPRESSION MICRODISCECTOMY N/A 09/25/2020   Procedure: Lumbar two-three Lumbar three-four Lumbar four-five   Laminectomy and foraminotomy;  Surgeon: Kristeen Miss, MD;  Location: [facility];  Service: Neurosurgery;  Laterality: N/A;   ROBOTIC ASSISTED LAPAROSCOPIC SACROCOLPOPEXY N/A 02/25/2019   Procedure: XI ROBOTIC ASSISTED LAPAROSCOPIC SACROCOLPOPEXY;  Surgeon: [name], MD;  Location: [facility];  Service: Urology;  Laterality: N/A;   ROTATOR CUFF REPAIR Bilateral    SHOULDER SURGERY     TONSILLECTOMY AND ADENOIDECTOMY  1957   TOTAL ABDOMINAL HYSTERECTOMY W/ BILATERAL SALPINGOOPHORECTOMY  7/90   US ECHOCARDIOGRAPHY  10/11/2009   EF 55-60%   Social History   Occupational History   Not on file  Tobacco Use   Smoking status: Former  Years: 10.00    Types: Cigarettes    Quit date: 01/01/1970    Years since quitting: 52.5   Smokeless tobacco: Never  Vaping Use   Vaping Use: Never used  Substance and Sexual Activity   Alcohol use: Yes    Comment: 1-2 glasses of wine    Drug use: No   Sexual activity: Yes    Partners: Male    Comment: hysterectomy

## 2022-07-11 DIAGNOSIS — N8111 Cystocele, midline: Secondary | ICD-10-CM | POA: Diagnosis not present

## 2022-07-11 DIAGNOSIS — N3000 Acute cystitis without hematuria: Secondary | ICD-10-CM | POA: Diagnosis not present

## 2022-07-16 ENCOUNTER — Telehealth: Payer: Self-pay | Admitting: Internal Medicine

## 2022-07-16 NOTE — Telephone Encounter (Signed)
Patient c/o Palpitations:  High priority if patient c/o lightheadedness, shortness of breath, or chest pain  How long have you had palpitations/irregular HR/ Afib? Are you having the symptoms now?   No.  Last night  Are you currently experiencing lightheadedness, SOB or CP?   No  Do you have a history of afib (atrial fibrillation) or irregular heart rhythm?   Yes  Have you checked your BP or HR? (document readings if available): No  Are you experiencing any other symptoms?   Patient stated she had afib last night and wants to know if Dr. Harrington Challenger wants her to wear a heart monitor.

## 2022-07-16 NOTE — Telephone Encounter (Signed)
Pt called to report that last nitgh she had an episode of her hear racing for about 3-4 min... she denies SOB, Dizziness associated with it... she has not had any new OTC meds and no change in her diet such as increased caffeine and she has not had any recent illness.   She says she slept well last night and feels much better today... she will continue to monitor and will keep her husbands Jodelle Red nearby in case she develops symptoms again... she will also call and let us know.

## 2022-07-17 DIAGNOSIS — Z85828 Personal history of other malignant neoplasm of skin: Secondary | ICD-10-CM | POA: Diagnosis not present

## 2022-07-17 DIAGNOSIS — L738 Other specified follicular disorders: Secondary | ICD-10-CM | POA: Diagnosis not present

## 2022-07-30 ENCOUNTER — Ambulatory Visit: Payer: Medicare Other | Admitting: Orthopaedic Surgery

## 2022-07-30 ENCOUNTER — Encounter: Payer: Self-pay | Admitting: Orthopaedic Surgery

## 2022-07-30 ENCOUNTER — Ambulatory Visit (INDEPENDENT_AMBULATORY_CARE_PROVIDER_SITE_OTHER): Payer: Medicare Other | Admitting: Orthopaedic Surgery

## 2022-07-30 DIAGNOSIS — M170 Bilateral primary osteoarthritis of knee: Secondary | ICD-10-CM | POA: Diagnosis not present

## 2022-07-30 DIAGNOSIS — M25562 Pain in left knee: Secondary | ICD-10-CM | POA: Diagnosis not present

## 2022-07-30 MED ORDER — LIDOCAINE HCL 1 % IJ SOLN
2.0000 mL | INTRAMUSCULAR | Status: AC | PRN
  Administered 2022-07-30: 2 mL

## 2022-07-30 MED ORDER — METHYLPREDNISOLONE ACETATE 40 MG/ML IJ SUSP
80.0000 mg | INTRAMUSCULAR | Status: AC | PRN
  Administered 2022-07-30: 80 mg via INTRA_ARTICULAR

## 2022-07-30 NOTE — Progress Notes (Signed)
Office Visit Note   Patient: Sabrina Parks           Date of Birth: February 05, 1941           MRN: [id number] Visit Date: 07/30/2022              Requested by: Ginger Organ., MD [address] [place],   [id number] PCP: Ginger Organ., MD   Assessment & Plan: Visit Diagnoses:  1. Left knee pain, unspecified chronicity   2. Primary osteoarthritis of both knees     Plan: Nela is a pleasant 82 year old woman with a history of arthritis in both of her knees.  She had her right knee injected with steroid approximately 2 weeks ago and had good results.  Requesting an injection into her left knee.  No injury.  We will go forward with a steroid injection into her left knee.  If she needs any follow-up for her joints we recommend Dr. Ninfa Linden  Follow-Up Instructions: Return if symptoms worsen or fail to improve.   Orders:  Orders Placed This Encounter  Procedures   Large Joint Inj: L knee   No orders of the defined types were placed in this encounter.     Procedures: Large Joint Inj: L knee on 07/30/2022 2:56 PM Indications: pain and diagnostic evaluation Details: 25 G 1.5 in needle, anteromedial approach  Arthrogram: No  Medications: 80 mg methylPREDNISolone acetate 40 MG/ML; 2 mL lidocaine 1 % Outcome: tolerated well, no immediate complications Procedure, treatment alternatives, risks and benefits explained, specific risks discussed. Consent was given by the patient.      Clinical Data: No additional findings.   Subjective: Chief Complaint  Patient presents with   Right Knee - Pain   Left Knee - Pain    HPI Ms. Mcmeans is a pleasant 82 year old woman with a history of arthritis in her knees.  She had an injection in her right knee a couple weeks ago and is doing well.  Requesting the same in her left knee  Review of Systems  All other systems reviewed and are negative.    Objective: Vital Signs: LMP 07/29/1990   Physical Exam Constitutional:       Appearance: Normal appearance.  Pulmonary:     Effort: Pulmonary effort is normal.  Skin:    General: Skin is warm and dry.  Neurological:     Mental Status: She is alert.     Ortho Exam Left knee no effusion no erythema.  No warmth.  Compartments are soft and nontender.  She does have grinding with range of motion.  Neurovascular intact Specialty Comments:  No specialty comments available.  Imaging: No results found.   PMFS History: Patient Active Problem List   Diagnosis Date Noted   Pain in left knee 07/30/2022   Unilateral primary osteoarthritis, right hip 11/22/2020   Lumbar stenosis with neurogenic claudication 09/25/2020   Trochanteric bursitis, right hip 04/18/2020   Osteoarthritis of knees, bilateral 03/01/2020   Pain due to onychomycosis of toenails of both feet 07/07/2019   Syncope 06/01/2019   Left bundle branch block 06/01/2019   Pelvic prolapse 02/25/2019   Pain in right hip 02/23/2019   Benign neoplasm of parotid gland 41/28/7867   Lichen sclerosus et atrophicus 06/14/2016   Hypothyroidism 06/14/2016   Hypertension    Hyperlipidemia    TIA (transient ischemic attack) 06/12/2012   Alopecia 08/01/2011   Past Medical History:  Diagnosis Date   Alopecia    Arthritis  Complication of anesthesia    DJD (degenerative joint disease), cervical 5/02   [facility]   GERD (gastroesophageal reflux disease)    Headache    HSV-1 infection    Hyperlipidemia    Hypertension    Hypothyroidism    Lichen sclerosus 28/36/6294   biopsy proven   Liver hemangioma 2011   Dr. Earlean Shawl   PONV (postoperative nausea and vomiting)    Raynaud's disease    Thyroiditis    Transient global amnesia    not fully diag    Family History  Problem Relation Age of Onset   Stroke Mother    Cancer Father    Colon cancer Father        38's    Past Surgical History:  Procedure Laterality Date   APPENDECTOMY  1956   CARDIOVASCULAR STRESS TEST  10/29/2006   EF 78%.    CATARACT  EXTRACTION Bilateral    CYSTOCELE REPAIR N/A 12/29/2018   Procedure: exam under anesthesia;  Surgeon: Bjorn Loser, MD;  Location: [facility];  Service: Urology;  Laterality: N/A;   LOOP RECORDER INSERTION N/A 05/01/2017   Procedure: LOOP RECORDER INSERTION;  Surgeon: Evans Lance, MD;  Location: [facility];  Service: Cardiovascular;  Laterality: N/A;   LUMBAR LAMINECTOMY/DECOMPRESSION MICRODISCECTOMY N/A 09/25/2020   Procedure: Lumbar two-three Lumbar three-four Lumbar four-five  Laminectomy and foraminotomy;  Surgeon: Kristeen Miss, MD;  Location: [facility];  Service: Neurosurgery;  Laterality: N/A;   ROBOTIC ASSISTED LAPAROSCOPIC SACROCOLPOPEXY N/A 02/25/2019   Procedure: XI ROBOTIC ASSISTED LAPAROSCOPIC SACROCOLPOPEXY;  Surgeon: [name], MD;  Location: [facility];  Service: Urology;  Laterality: N/A;   ROTATOR CUFF REPAIR Bilateral    SHOULDER SURGERY     TONSILLECTOMY AND ADENOIDECTOMY  1957   TOTAL ABDOMINAL HYSTERECTOMY W/ BILATERAL SALPINGOOPHORECTOMY  7/90   US ECHOCARDIOGRAPHY  10/11/2009   EF 55-60%   Social History   Occupational History   Not on file  Tobacco Use   Smoking status: Former    Years: 10.00    Types: Cigarettes    Quit date: 01/01/1970    Years since quitting: 52.6   Smokeless tobacco: Never  Vaping Use   Vaping Use: Never used  Substance and Sexual Activity   Alcohol use: Yes    Comment: 1-2 glasses of wine    Drug use: No   Sexual activity: Yes    Partners: Male    Comment: hysterectomy

## 2022-08-07 ENCOUNTER — Other Ambulatory Visit: Payer: Medicare Other

## 2022-08-08 ENCOUNTER — Ambulatory Visit
Admission: RE | Admit: 2022-08-08 | Discharge: 2022-08-08 | Disposition: A | Discharge status: Home / Self Care | Payer: Medicare Other | Source: Ambulatory Visit | Attending: Internal Medicine | Admitting: Internal Medicine

## 2022-08-08 DIAGNOSIS — N2889 Other specified disorders of kidney and ureter: Secondary | ICD-10-CM | POA: Diagnosis not present

## 2022-08-08 DIAGNOSIS — K769 Liver disease, unspecified: Secondary | ICD-10-CM | POA: Diagnosis not present

## 2022-08-08 DIAGNOSIS — N289 Disorder of kidney and ureter, unspecified: Secondary | ICD-10-CM

## 2022-08-08 DIAGNOSIS — C4490 Unspecified malignant neoplasm of skin, unspecified: Secondary | ICD-10-CM | POA: Diagnosis not present

## 2022-08-08 DIAGNOSIS — Z87440 Personal history of urinary (tract) infections: Secondary | ICD-10-CM | POA: Diagnosis not present

## 2022-08-08 MED ORDER — IOPAMIDOL (ISOVUE-300) INJECTION 61%
100.0000 mL | Freq: Once | INTRAVENOUS | Status: AC | PRN
  Administered 2022-08-08: 100 mL via INTRAVENOUS

## 2022-08-13 ENCOUNTER — Other Ambulatory Visit: Payer: Self-pay | Admitting: Internal Medicine

## 2022-08-13 DIAGNOSIS — R911 Solitary pulmonary nodule: Secondary | ICD-10-CM

## 2022-08-21 ENCOUNTER — Ambulatory Visit
Admission: RE | Admit: 2022-08-21 | Discharge: 2022-08-21 | Disposition: A | Discharge status: Home / Self Care | Payer: Medicare Other | Source: Ambulatory Visit | Attending: Internal Medicine | Admitting: Internal Medicine

## 2022-08-21 DIAGNOSIS — I700 Atherosclerosis of aorta: Secondary | ICD-10-CM | POA: Diagnosis not present

## 2022-08-21 DIAGNOSIS — R918 Other nonspecific abnormal finding of lung field: Secondary | ICD-10-CM | POA: Diagnosis not present

## 2022-08-21 DIAGNOSIS — R911 Solitary pulmonary nodule: Secondary | ICD-10-CM | POA: Diagnosis not present

## 2022-08-27 ENCOUNTER — Ambulatory Visit
Admission: RE | Admit: 2022-08-27 | Discharge: 2022-08-27 | Disposition: A | Discharge status: Home / Self Care | Payer: Medicare Other | Source: Ambulatory Visit | Attending: Family Medicine | Admitting: Family Medicine

## 2022-08-27 ENCOUNTER — Other Ambulatory Visit: Payer: Self-pay | Admitting: Family Medicine

## 2022-08-27 DIAGNOSIS — N644 Mastodynia: Secondary | ICD-10-CM

## 2022-08-27 DIAGNOSIS — N6489 Other specified disorders of breast: Secondary | ICD-10-CM | POA: Diagnosis not present

## 2022-08-27 DIAGNOSIS — R928 Other abnormal and inconclusive findings on diagnostic imaging of breast: Secondary | ICD-10-CM | POA: Diagnosis not present

## 2022-09-03 IMAGING — CT CT CHEST-ABD-PELV W/ CM
2 of 5 series · 11 of 46 positions shown, 12 images · IV contrast (iopamidol)
Comparison: 05/12/2021 abdominopelvic CT. Remote chest CT
10/14/2011 also reviewed.

CLINICAL DATA: Left lower lobe pulmonary nodule and right renal
lesion. History of liver hemangioma. Hysterectomy and appendectomy.
No history of cancer.

EXAM:
CT CHEST, ABDOMEN, AND PELVIS WITH CONTRAST
TECHNIQUE: Multidetector CT imaging of the chest, abdomen and pelvis was
performed following the standard protocol during bolus
administration of intravenous contrast.
CONTRAST:  100mL [id number] IOPAMIDOL ([id number]) INJECTION 61%

[Series 2: cap with 5.00 br40 s3 axial · axial · 0.56mm/px · z∈[+1062,+1562]mm · 8 of 121 slices shown, 9 images]
[im 11/121  soft-tissue]
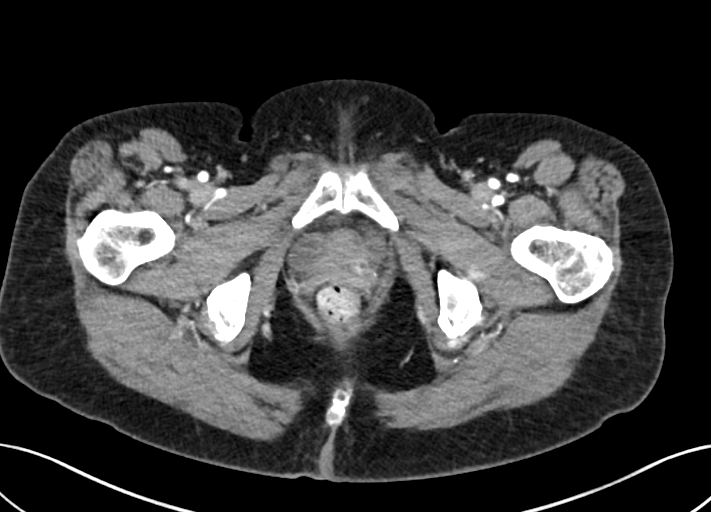
[im 11/121  bone]
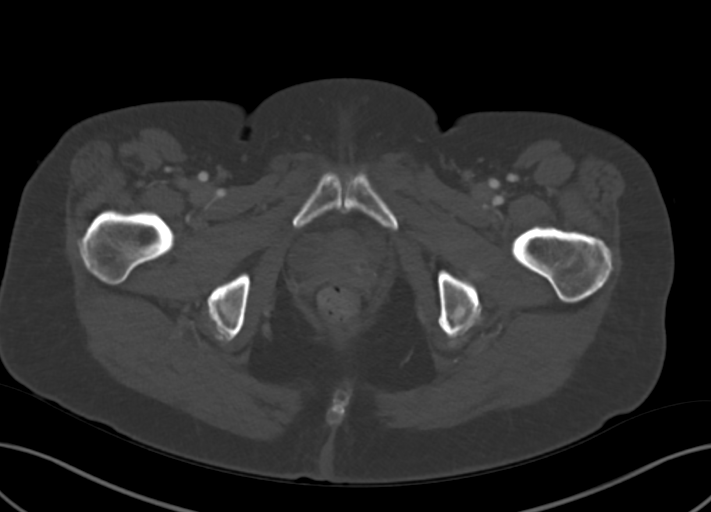
[im 31/121  soft-tissue]
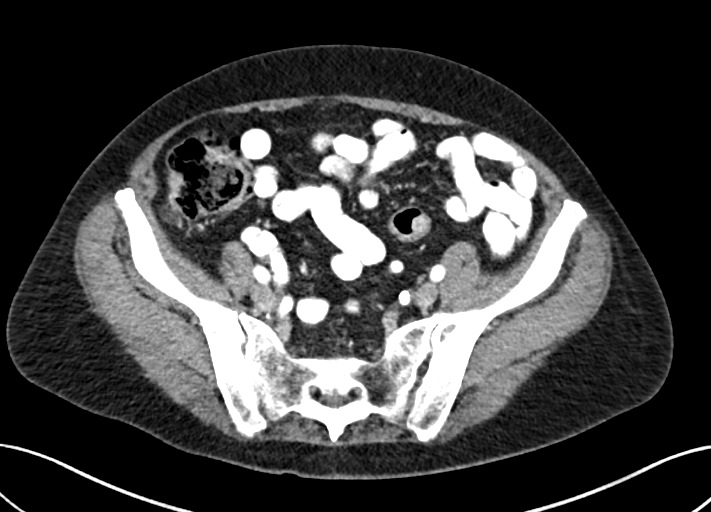
[im 41/121  soft-tissue]
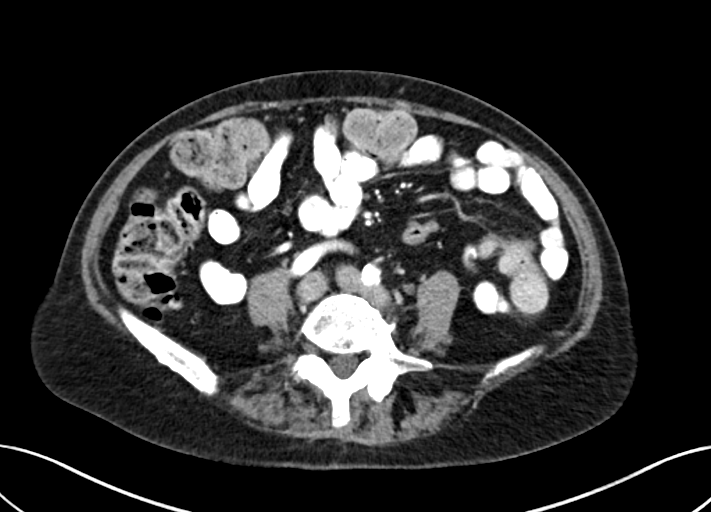
[im 51/121  soft-tissue]
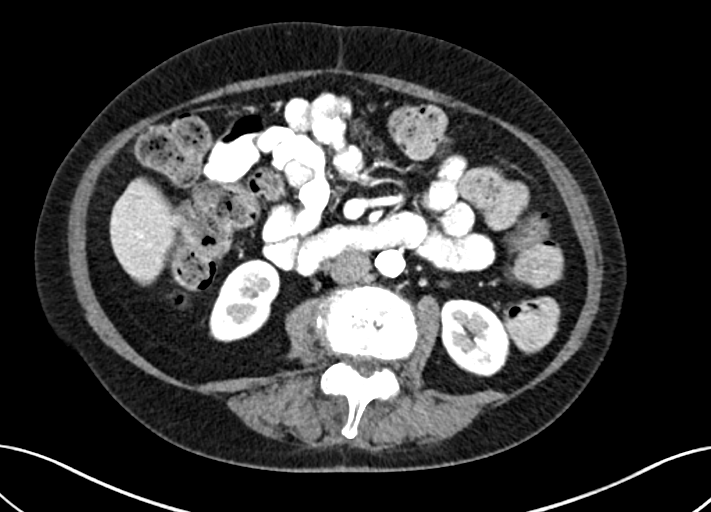
[im 71/121  soft-tissue]
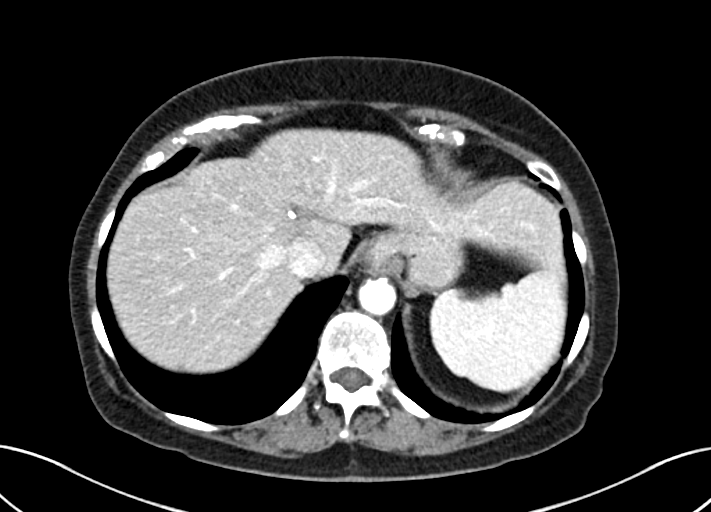
[im 81/121  soft-tissue]
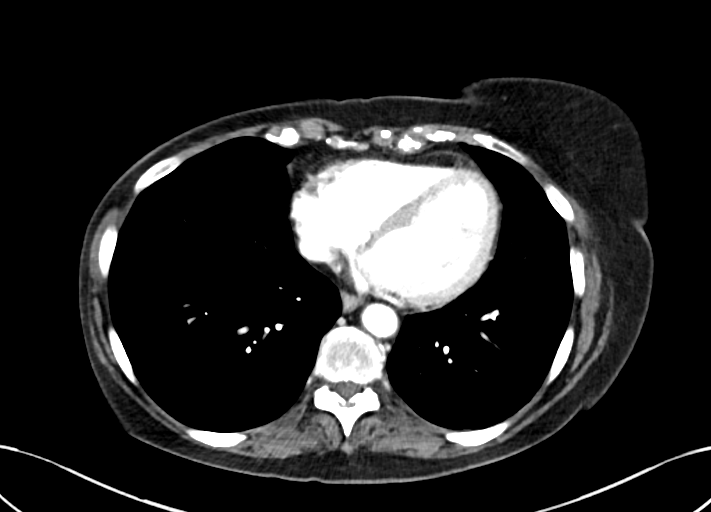
[im 91/121  soft-tissue]
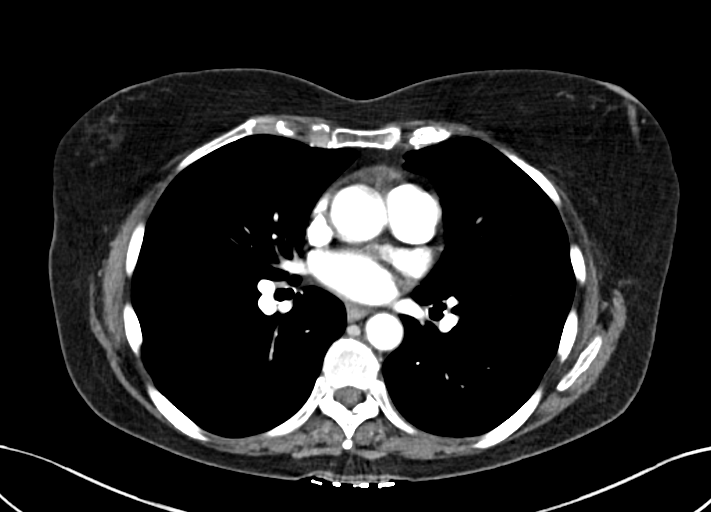
[im 111/121  soft-tissue]
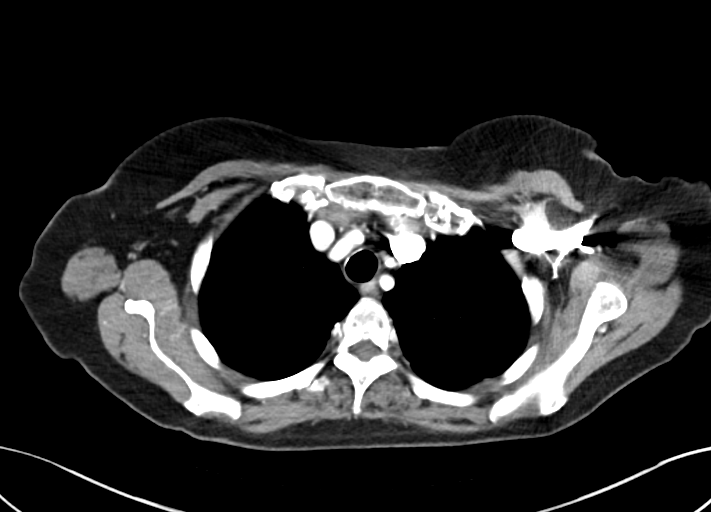

[Series 6: cap with 2.00 br40 s3 cor · coronal · 0.78mm/px · 3 of 138 slices shown]
[im 46/138  soft-tissue]
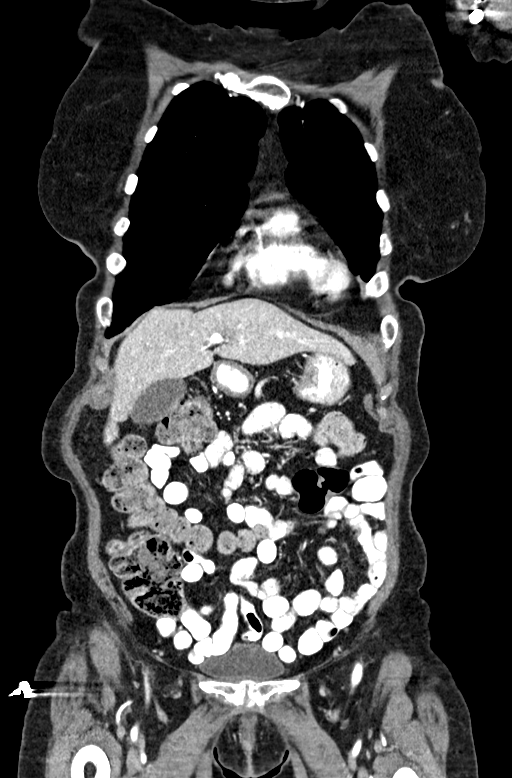
[im 61/138  soft-tissue]
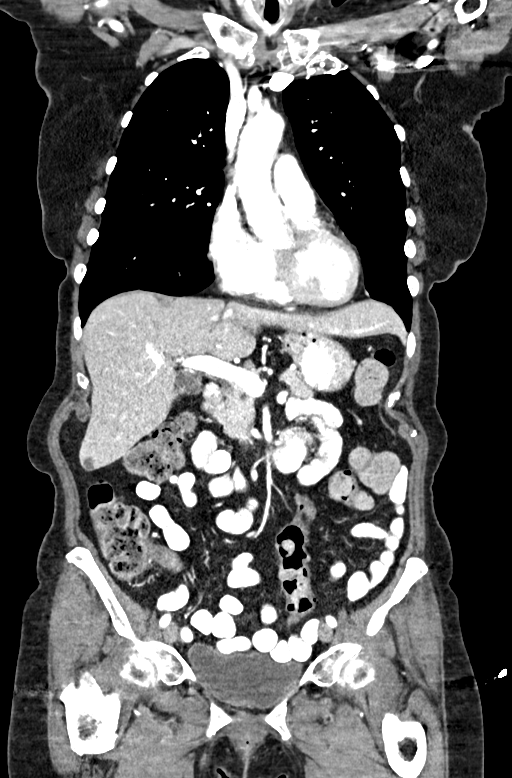
[im 77/138  soft-tissue]
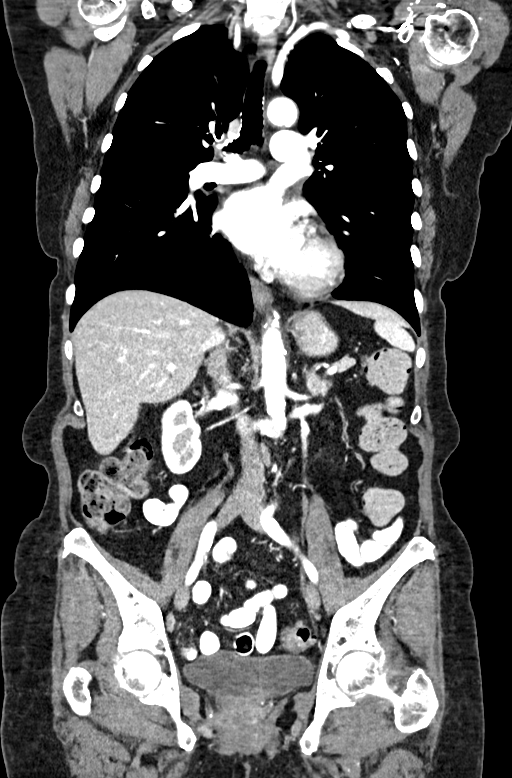

[11 of 46 positions shown; findings below may reference images not displayed]

FINDINGS: CT CHEST FINDINGS

Cardiovascular: Aortic atherosclerosis. Normal heart size, without
pericardial effusion.

Mediastinum/Nodes: No mediastinal or hilar adenopathy. Tiny hiatal
hernia.

Lungs/Pleura: No pleural fluid. Mild biapical pleuroparenchymal
scarring.

5 mm posterior left upper lobe nodule versus area of minimal
scarring, felt to be similar back to 5658 given differences in slice
thickness.

Superior segment left lower lobe 7 mm nodule is also present on
10/04/2011 and can be presumed benign.

The subpleural left lower lobe nodule of interest measures 9 x 9 mm
on 115/4 and is similar to 05/12/2021 (when remeasured).

Musculoskeletal: No acute osseous abnormality. Bilateral rotator
cuff repairs.

CT ABDOMEN PELVIS FINDINGS

Hepatobiliary: Small well-circumscribed bilateral hepatic lesions
are likely cysts and are similar. Nonspecific lateral segment left
liver lobe enlargement.

Small gallstones without acute cholecystitis or biliary duct
dilatation.

Pancreas: Normal, without mass or ductal dilatation.

Spleen: Normal in size, without focal abnormality.

Adrenals/Urinary Tract: Normal adrenal glands. The area of
hypoattenuation within the posteromedial upper pole right kidney on
delayed images is decreased. For example, measures 6 mm on [DATE]
today versus on the order of 1.5 cm back on 03/27/2021 (when
remeasured).

Normal left kidney.  No hydronephrosis.  Normal urinary bladder.

Stomach/Bowel: Normal remainder of the stomach.

Colonic stool burden suggests constipation. Normal terminal ileum
and appendix. Normal small bowel.

Vascular/Lymphatic: Advanced aortic and branch vessel
atherosclerosis. Retroaortic left renal vein. No abdominopelvic
adenopathy.

Reproductive: Hysterectomy.  No adnexal mass.

Other: No significant free fluid.  Moderate pelvic floor laxity.

Musculoskeletal: Left greater than right hip osteoarthritis.
Lumbosacral spondylosis.
IMPRESSION: 1. The left lower lobe pulmonary nodule is stable over 3 months,
which is reassuring. This remains technically indeterminate.
Recommend chest CT follow-up at 6-9 months.
2. The upper pole right renal area of hypoenhancement is decreased
when compared to the abdominal CT of 03/27/2021. Favored to
represent the sequelae of prior ischemia or infection. Consider 1
further follow-up at 1 year, with either renal protocol CT or MRI.
3.  Tiny hiatal hernia.
4. Cholelithiasis
5.  Aortic Atherosclerosis ([id number]).
6.  Colonic stool burden suggests constipation.

## 2022-10-02 DIAGNOSIS — H353211 Exudative age-related macular degeneration, right eye, with active choroidal neovascularization: Secondary | ICD-10-CM | POA: Diagnosis not present

## 2022-10-30 DIAGNOSIS — D1801 Hemangioma of skin and subcutaneous tissue: Secondary | ICD-10-CM | POA: Diagnosis not present

## 2022-10-30 DIAGNOSIS — Z85828 Personal history of other malignant neoplasm of skin: Secondary | ICD-10-CM | POA: Diagnosis not present

## 2022-10-30 DIAGNOSIS — L570 Actinic keratosis: Secondary | ICD-10-CM | POA: Diagnosis not present

## 2022-10-30 DIAGNOSIS — L821 Other seborrheic keratosis: Secondary | ICD-10-CM | POA: Diagnosis not present

## 2022-11-12 DIAGNOSIS — H43813 Vitreous degeneration, bilateral: Secondary | ICD-10-CM | POA: Diagnosis not present

## 2022-11-12 DIAGNOSIS — H26492 Other secondary cataract, left eye: Secondary | ICD-10-CM | POA: Diagnosis not present

## 2022-11-12 DIAGNOSIS — H353231 Exudative age-related macular degeneration, bilateral, with active choroidal neovascularization: Secondary | ICD-10-CM | POA: Diagnosis not present

## 2022-11-12 DIAGNOSIS — H52203 Unspecified astigmatism, bilateral: Secondary | ICD-10-CM | POA: Diagnosis not present

## 2022-11-12 DIAGNOSIS — H04123 Dry eye syndrome of bilateral lacrimal glands: Secondary | ICD-10-CM | POA: Diagnosis not present

## 2022-11-12 DIAGNOSIS — H524 Presbyopia: Secondary | ICD-10-CM | POA: Diagnosis not present

## 2022-11-25 ENCOUNTER — Other Ambulatory Visit (INDEPENDENT_AMBULATORY_CARE_PROVIDER_SITE_OTHER): Payer: Medicare Other

## 2022-11-25 ENCOUNTER — Encounter: Payer: Self-pay | Admitting: Physician Assistant

## 2022-11-25 ENCOUNTER — Ambulatory Visit (INDEPENDENT_AMBULATORY_CARE_PROVIDER_SITE_OTHER): Payer: Medicare Other | Admitting: Physician Assistant

## 2022-11-25 DIAGNOSIS — M25511 Pain in right shoulder: Secondary | ICD-10-CM

## 2022-11-25 MED ORDER — METHYLPREDNISOLONE ACETATE 40 MG/ML IJ SUSP
80.0000 mg | INTRAMUSCULAR | Status: AC | PRN
  Administered 2022-11-25: 80 mg via INTRA_ARTICULAR

## 2022-11-25 MED ORDER — BUPIVACAINE HCL 0.25 % IJ SOLN
2.0000 mL | INTRAMUSCULAR | Status: AC | PRN
  Administered 2022-11-25: 2 mL via INTRA_ARTICULAR

## 2022-11-25 MED ORDER — LIDOCAINE HCL 1 % IJ SOLN
2.0000 mL | INTRAMUSCULAR | Status: AC | PRN
  Administered 2022-11-25: 2 mL

## 2022-11-25 NOTE — Progress Notes (Signed)
Office Visit Note   Patient: Sabrina Parks           Date of Birth: 06/26/1941           MRN: [id number] Visit Date: 11/25/2022              Requested by: Cleatis Polka., MD [address] [place],  [place] [address] PCP: Cleatis Polka., MD  Chief Complaint  Patient presents with  . Right Shoulder - Pain      HPI: [name] is a pleasant 82 year old woman who presents today with a chief complaint of right shoulder pain.  She did have a fall about 6 weeks ago onto her right shoulder but did not think much of it.  She is status post right shoulder rotator cuff repair many years ago by another provider.  She admits she did have a little pain in the shoulder before the fall.  Denies any neck pain  Assessment & Plan: Visit Diagnoses:  1. Acute pain of right shoulder     Plan: She does have some shoulder arthritis as well as on exam some rotator cuff findings.  Had a long discussion with her.  Will try a subacromial injection and see how she does.  If she does not get significant relief she will contact me and I would refer her to Dr. August Saucer and Franky Macho to do a glenohumeral injection  Follow-Up Instructions: Return if symptoms worsen or fail to improve.   Ortho Exam  Patient is alert, oriented, no adenopathy, well-dressed, normal affect, normal respiratory effort. Right shoulder well-healed surgical scars.  She does go up overhead both actively and passively though she admits she has a little pain going up overhead and internal rotation behind her back is also somewhat painful.  She is tender over the top of her shoulder into her deltoid.  She has a positive empty can test negative speeds test.  She does have some stiffness and pain with external rotation as well.  She is neurovascular intact and has good grip strength  Imaging: No results found. No images are attached to the encounter.  Labs: Lab Results  Component Value Date   ESRSEDRATE 9 12/18/2018   REPTSTATUS 07/26/2019  FINAL 07/25/2019   CULT  07/25/2019    NO GROWTH Performed at [facility], [address] [place], [place] [address]      Lab Results  Component Value Date   ALBUMIN 4.3 05/12/2021   ALBUMIN 4.6 02/24/2019   ALBUMIN 4.1 06/13/2012    No results found for: "MG" No results found for: "VD25OH"  No results found for: "PREALBUMIN"    Latest Ref Rng & Units 05/12/2021    6:42 AM 09/21/2020    2:59 PM 02/26/2019    5:11 AM  CBC EXTENDED  WBC 4.0 - 10.5 K/uL 12.4  11.4    RBC 3.87 - 5.11 MIL/uL 4.32  4.43    Hemoglobin 12.0 - 15.0 g/dL 86.5  78.4  69.6   HCT 36.0 - 46.0 % 37.3  40.0  31.9   Platelets 150 - 400 K/uL 368  376    NEUT# 1.7 - 7.7 K/uL 9.4     Lymph# 0.7 - 4.0 K/uL 1.8        There is no height or weight on file to calculate BMI.  Orders:  Orders Placed This Encounter  Procedures  . XR Shoulder Right   No orders of the defined types were placed in this  encounter.    Procedures: Large Joint Inj: R subacromial bursa on 11/25/2022 3:44 PM Indications: diagnostic evaluation and pain Details: 25 G 1.5 in needle, posterior approach  Arthrogram: No  Medications: 2 mL lidocaine 1 %; 80 mg methylPREDNISolone acetate 40 MG/ML; 2 mL bupivacaine 0.25 % Outcome: tolerated well, no immediate complications Procedure, treatment alternatives, risks and benefits explained, specific risks discussed. Consent was given by the patient.    Clinical Data: No additional findings.  ROS:  All other systems negative, except as noted in the HPI. Review of Systems  Objective: Vital Signs: LMP 07/29/1990   Specialty Comments:  No specialty comments available.  PMFS History: Patient Active Problem List   Diagnosis Date Noted  . Pain in right shoulder 11/25/2022  . Pain in left knee 07/30/2022  . Unilateral primary osteoarthritis, right hip 11/22/2020  . Lumbar stenosis with neurogenic claudication 09/25/2020  . Trochanteric bursitis, right hip 04/18/2020  .  Osteoarthritis of knees, bilateral 03/01/2020  . Pain due to onychomycosis of toenails of both feet 07/07/2019  . Syncope 06/01/2019  . Left bundle branch block 06/01/2019  . Pelvic prolapse 02/25/2019  . Pain in right hip 02/23/2019  . 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 Lichen sclerosus et atrophicus 06/14/2016  . Hypothyroidism 06/14/2016  . Hypertension   . Hyperlipidemia   . TIA (transient ischemic attack) 06/12/2012  . Alopecia 08/01/2011   Past Medical History:  Diagnosis Date  . Alopecia   . Arthritis   . Complication of anesthesia   . DJD (degenerative joint disease), cervical 5/02   Elsner  . GERD (gastroesophageal reflux disease)   . Headache   . HSV-1 infection   . Hyperlipidemia   . Hypertension   . Hypothyroidism   . Lichen sclerosus 05/22/2016   biopsy proven  . Liver hemangioma 2011   [name]  . PONV (postoperative nausea and vomiting)   . Raynaud's disease   . Thyroiditis   . Transient global amnesia    not fully diag    Family History  Problem Relation Age of Onset  . Stroke Mother   . Cancer Father   . Colon cancer Father        42's    Past Surgical History:  Procedure Laterality Date  . APPENDECTOMY  1956  . CARDIOVASCULAR STRESS TEST  10/29/2006   EF 78%.   Marland Kitchen CATARACT EXTRACTION Bilateral   . CYSTOCELE REPAIR N/A 12/29/2018   Procedure: exam under anesthesia;  Surgeon: [name], MD;  Location: [facility];  Service: Urology;  Laterality: N/A;  . LOOP RECORDER INSERTION N/A 05/01/2017   Procedure: LOOP RECORDER INSERTION;  Surgeon: [name], MD;  [facility];  Service: Cardiovascular;  Laterality: N/A;  . LUMBAR LAMINECTOMY/DECOMPRESSION MICRODISCECTOMY N/A 09/25/2020   Procedure: Lumbar two-three Lumbar three-four Lumbar four-five  Laminectomy and foraminotomy;  Surgeon: [name], MD;  Location: [facility];  Service: Neurosurgery;  Laterality: N/A;  . ROBOTIC ASSISTED LAPAROSCOPIC SACROCOLPOPEXY N/A  02/25/2019   Procedure: XI ROBOTIC ASSISTED LAPAROSCOPIC SACROCOLPOPEXY;  Surgeon: [name], MD;  Location: [facility];  Service: Urology;  Laterality: N/A;  . ROTATOR CUFF REPAIR Bilateral   . SHOULDER SURGERY    . TONSILLECTOMY AND ADENOIDECTOMY  1957  . TOTAL ABDOMINAL HYSTERECTOMY W/ BILATERAL SALPINGOOPHORECTOMY  7/90  . US ECHOCARDIOGRAPHY  10/11/2009   EF 55-60%   Social History   Occupational History  . Not on file  Tobacco Use  . Smoking status:  Former    Years: 10    Types: Cigarettes    Quit date: 01/01/1970    Years since quitting: 52.9  . Smokeless tobacco: Never  Vaping Use  . Vaping Use: Never used  Substance and Sexual Activity  . Alcohol use: Yes    Comment: 1-2 glasses of wine   . Drug use: No  . Sexual activity: Yes    Partners: Male    Comment: hysterectomy

## 2022-12-05 ENCOUNTER — Telehealth: Payer: Self-pay | Admitting: Internal Medicine

## 2022-12-05 DIAGNOSIS — R002 Palpitations: Secondary | ICD-10-CM

## 2022-12-05 NOTE — Telephone Encounter (Signed)
  Patient c/o Palpitations:  High priority if patient c/o lightheadedness, shortness of breath, or chest pain  How long have you had palpitations/irregular HR/ Afib? Are you having the symptoms now? No   Are you currently experiencing lightheadedness, SOB or CP? None   Do you have a history of afib (atrial fibrillation) or irregular heart rhythm?   Have you checked your BP or HR? (document readings if available):   Are you experiencing any other symptoms?  Pt said, she cal feel she's having arrhythmia again, when she use her husband's machine it said Unclassified arrhythmia. She wants to know if she need to see Dr. Tenny Craw sooner

## 2022-12-05 NOTE — Telephone Encounter (Signed)
Pt called to report that she has always had palpitations and they never "bother" her but just to see what it would show she used her husbands Kardia mobile and it showed the "unspecified rhythm"... she says they may be worsening some but no other associated symptoms except maybe they are more often but she is really not sure how much she is having since she is used to having them.   I will forward to Dr Tenny Craw.. the pt is due to see [place] and I will make her an appt but will wait to see if DR Tenny Craw would like a monitor prior to seeing her.

## 2022-12-06 NOTE — Telephone Encounter (Signed)
Spoke to the patient she was not able to send a copy of report off the [employer] mobile. Pt stated the screen on device said " HR 84, no afib, does not fall under the classification of tachycardia and no other arrhythmias. Will forward to MD and nurse for advise.

## 2022-12-06 NOTE — Telephone Encounter (Signed)
Left a message for the pt to call back.  

## 2022-12-06 NOTE — Telephone Encounter (Signed)
Spoke with the patient who states that she is going to try and save readings and send through MyChart.  She states that it is not happening very often but has caught it on 3 different occasions.

## 2022-12-06 NOTE — Telephone Encounter (Signed)
Can she forward some of the strips that she is talking about so that I can see them?  Via MyChart or printed? How often is it happening?  May be tough to catch on a monitor

## 2022-12-06 NOTE — Telephone Encounter (Signed)
Pt returning call

## 2022-12-06 NOTE — Telephone Encounter (Signed)
Patient is wanting to confirm the mychart message was received regarding this. Please advise.

## 2022-12-10 NOTE — Telephone Encounter (Signed)
If she is still having palpitations then set her up for a 2 wk Zio patch

## 2022-12-11 ENCOUNTER — Ambulatory Visit: Payer: Medicare Other | Attending: Internal Medicine

## 2022-12-11 DIAGNOSIS — R002 Palpitations: Secondary | ICD-10-CM

## 2022-12-11 NOTE — Telephone Encounter (Signed)
My Chart sent to the pt and Zio ordered.

## 2022-12-11 NOTE — Progress Notes (Unsigned)
Enrolled for [employer] to mail a ZIO XT long term holter monitor to the patients address on file.  

## 2022-12-14 DIAGNOSIS — R002 Palpitations: Secondary | ICD-10-CM | POA: Diagnosis not present

## 2022-12-17 DIAGNOSIS — H26492 Other secondary cataract, left eye: Secondary | ICD-10-CM | POA: Diagnosis not present

## 2022-12-20 ENCOUNTER — Other Ambulatory Visit: Payer: Self-pay | Admitting: Neurological Surgery

## 2022-12-20 DIAGNOSIS — M4712 Other spondylosis with myelopathy, cervical region: Secondary | ICD-10-CM | POA: Diagnosis not present

## 2023-01-03 DIAGNOSIS — R002 Palpitations: Secondary | ICD-10-CM | POA: Diagnosis not present

## 2023-01-07 DIAGNOSIS — M50223 Other cervical disc displacement at C6-C7 level: Secondary | ICD-10-CM | POA: Diagnosis not present

## 2023-01-07 DIAGNOSIS — M4802 Spinal stenosis, cervical region: Secondary | ICD-10-CM | POA: Diagnosis not present

## 2023-01-07 DIAGNOSIS — M4712 Other spondylosis with myelopathy, cervical region: Secondary | ICD-10-CM | POA: Diagnosis not present

## 2023-01-08 ENCOUNTER — Telehealth: Payer: Self-pay

## 2023-01-08 DIAGNOSIS — E039 Hypothyroidism, unspecified: Secondary | ICD-10-CM | POA: Diagnosis not present

## 2023-01-08 DIAGNOSIS — I10 Essential (primary) hypertension: Secondary | ICD-10-CM | POA: Diagnosis not present

## 2023-01-08 DIAGNOSIS — K219 Gastro-esophageal reflux disease without esophagitis: Secondary | ICD-10-CM | POA: Diagnosis not present

## 2023-01-08 DIAGNOSIS — R7301 Impaired fasting glucose: Secondary | ICD-10-CM | POA: Diagnosis not present

## 2023-01-08 DIAGNOSIS — D649 Anemia, unspecified: Secondary | ICD-10-CM | POA: Diagnosis not present

## 2023-01-08 DIAGNOSIS — E785 Hyperlipidemia, unspecified: Secondary | ICD-10-CM | POA: Diagnosis not present

## 2023-01-08 DIAGNOSIS — M8580 Other specified disorders of bone density and structure, unspecified site: Secondary | ICD-10-CM | POA: Diagnosis not present

## 2023-01-08 NOTE — Telephone Encounter (Signed)
Pt advised her monitor results and she reports that she has a single "hard heart beat" every once in a while or a weird feeling in her chest that makes her feel a little light headed... it is not often but does happen and she had it when she triggered it when she was wearing her monitor which says NSR... I will forward to Dr Tenny Craw for review.   Pts says otherwise she feels well.. no SOB no dizziness.

## 2023-01-08 NOTE — Telephone Encounter (Signed)
-----   Message from [name], MD sent at 01/08/2023  5:36 AM EDT ----- Monitor showed rare skips Triggered events corresponded to SR No significant arrhythmias detected    How is Sabrina Parks feeling?

## 2023-01-09 DIAGNOSIS — H353211 Exudative age-related macular degeneration, right eye, with active choroidal neovascularization: Secondary | ICD-10-CM | POA: Diagnosis not present

## 2023-01-15 DIAGNOSIS — I700 Atherosclerosis of aorta: Secondary | ICD-10-CM | POA: Diagnosis not present

## 2023-01-15 DIAGNOSIS — Z8673 Personal history of transient ischemic attack (TIA), and cerebral infarction without residual deficits: Secondary | ICD-10-CM | POA: Diagnosis not present

## 2023-01-15 DIAGNOSIS — Z23 Encounter for immunization: Secondary | ICD-10-CM | POA: Diagnosis not present

## 2023-01-15 DIAGNOSIS — Z1339 Encounter for screening examination for other mental health and behavioral disorders: Secondary | ICD-10-CM | POA: Diagnosis not present

## 2023-01-15 DIAGNOSIS — E039 Hypothyroidism, unspecified: Secondary | ICD-10-CM | POA: Diagnosis not present

## 2023-01-15 DIAGNOSIS — R911 Solitary pulmonary nodule: Secondary | ICD-10-CM | POA: Diagnosis not present

## 2023-01-15 DIAGNOSIS — D692 Other nonthrombocytopenic purpura: Secondary | ICD-10-CM | POA: Diagnosis not present

## 2023-01-15 DIAGNOSIS — R7301 Impaired fasting glucose: Secondary | ICD-10-CM | POA: Diagnosis not present

## 2023-01-15 DIAGNOSIS — Z0000 Encounter for general adult medical examination without abnormal findings: Secondary | ICD-10-CM | POA: Diagnosis not present

## 2023-01-15 DIAGNOSIS — H353230 Exudative age-related macular degeneration, bilateral, stage unspecified: Secondary | ICD-10-CM | POA: Diagnosis not present

## 2023-01-15 DIAGNOSIS — E871 Hypo-osmolality and hyponatremia: Secondary | ICD-10-CM | POA: Diagnosis not present

## 2023-01-15 DIAGNOSIS — E785 Hyperlipidemia, unspecified: Secondary | ICD-10-CM | POA: Diagnosis not present

## 2023-01-15 DIAGNOSIS — R82998 Other abnormal findings in urine: Secondary | ICD-10-CM | POA: Diagnosis not present

## 2023-01-15 DIAGNOSIS — I10 Essential (primary) hypertension: Secondary | ICD-10-CM | POA: Diagnosis not present

## 2023-01-15 DIAGNOSIS — M5412 Radiculopathy, cervical region: Secondary | ICD-10-CM | POA: Diagnosis not present

## 2023-01-15 DIAGNOSIS — K219 Gastro-esophageal reflux disease without esophagitis: Secondary | ICD-10-CM | POA: Diagnosis not present

## 2023-01-15 DIAGNOSIS — Z1331 Encounter for screening for depression: Secondary | ICD-10-CM | POA: Diagnosis not present

## 2023-01-20 DIAGNOSIS — M25522 Pain in left elbow: Secondary | ICD-10-CM | POA: Diagnosis not present

## 2023-01-20 DIAGNOSIS — T148XXA Other injury of unspecified body region, initial encounter: Secondary | ICD-10-CM | POA: Diagnosis not present

## 2023-01-20 DIAGNOSIS — W19XXXA Unspecified fall, initial encounter: Secondary | ICD-10-CM | POA: Diagnosis not present

## 2023-01-27 DIAGNOSIS — M8589 Other specified disorders of bone density and structure, multiple sites: Secondary | ICD-10-CM | POA: Diagnosis not present

## 2023-02-04 DIAGNOSIS — N8111 Cystocele, midline: Secondary | ICD-10-CM | POA: Diagnosis not present

## 2023-02-04 DIAGNOSIS — R3914 Feeling of incomplete bladder emptying: Secondary | ICD-10-CM | POA: Diagnosis not present

## 2023-02-04 DIAGNOSIS — N3000 Acute cystitis without hematuria: Secondary | ICD-10-CM | POA: Diagnosis not present

## 2023-02-05 DIAGNOSIS — M4712 Other spondylosis with myelopathy, cervical region: Secondary | ICD-10-CM | POA: Diagnosis not present

## 2023-02-06 ENCOUNTER — Other Ambulatory Visit: Payer: Self-pay | Admitting: Internal Medicine

## 2023-02-06 DIAGNOSIS — D320 Benign neoplasm of cerebral meninges: Secondary | ICD-10-CM

## 2023-02-10 DIAGNOSIS — N390 Urinary tract infection, site not specified: Secondary | ICD-10-CM | POA: Diagnosis not present

## 2023-02-13 ENCOUNTER — Ambulatory Visit
Admission: RE | Admit: 2023-02-13 | Discharge: 2023-02-13 | Disposition: A | Discharge status: Home / Self Care | Payer: Medicare Other | Source: Ambulatory Visit | Attending: Internal Medicine | Admitting: Internal Medicine

## 2023-02-13 DIAGNOSIS — R9082 White matter disease, unspecified: Secondary | ICD-10-CM | POA: Diagnosis not present

## 2023-02-13 DIAGNOSIS — D320 Benign neoplasm of cerebral meninges: Secondary | ICD-10-CM

## 2023-02-13 MED ORDER — GADOPICLENOL 0.5 MMOL/ML IV SOLN
6.0000 mL | Freq: Once | INTRAVENOUS | Status: AC | PRN
  Administered 2023-02-13: 6 mL via INTRAVENOUS

## 2023-02-21 DIAGNOSIS — Z1231 Encounter for screening mammogram for malignant neoplasm of breast: Secondary | ICD-10-CM | POA: Diagnosis not present

## 2023-02-22 DIAGNOSIS — M25511 Pain in right shoulder: Secondary | ICD-10-CM | POA: Diagnosis not present

## 2023-02-22 DIAGNOSIS — M542 Cervicalgia: Secondary | ICD-10-CM | POA: Diagnosis not present

## 2023-02-22 DIAGNOSIS — M25512 Pain in left shoulder: Secondary | ICD-10-CM | POA: Diagnosis not present

## 2023-02-22 DIAGNOSIS — M545 Low back pain, unspecified: Secondary | ICD-10-CM | POA: Diagnosis not present

## 2023-02-24 DIAGNOSIS — H35372 Puckering of macula, left eye: Secondary | ICD-10-CM | POA: Diagnosis not present

## 2023-02-24 DIAGNOSIS — H353211 Exudative age-related macular degeneration, right eye, with active choroidal neovascularization: Secondary | ICD-10-CM | POA: Diagnosis not present

## 2023-02-24 DIAGNOSIS — H35453 Secondary pigmentary degeneration, bilateral: Secondary | ICD-10-CM | POA: Diagnosis not present

## 2023-02-24 DIAGNOSIS — H35363 Drusen (degenerative) of macula, bilateral: Secondary | ICD-10-CM | POA: Diagnosis not present

## 2023-02-24 DIAGNOSIS — H353122 Nonexudative age-related macular degeneration, left eye, intermediate dry stage: Secondary | ICD-10-CM | POA: Diagnosis not present

## 2023-02-24 DIAGNOSIS — Z961 Presence of intraocular lens: Secondary | ICD-10-CM | POA: Diagnosis not present

## 2023-03-03 ENCOUNTER — Other Ambulatory Visit: Payer: Medicare Other

## 2023-03-03 DIAGNOSIS — M25511 Pain in right shoulder: Secondary | ICD-10-CM | POA: Diagnosis not present

## 2023-03-03 DIAGNOSIS — M25512 Pain in left shoulder: Secondary | ICD-10-CM | POA: Diagnosis not present

## 2023-03-03 DIAGNOSIS — M545 Low back pain, unspecified: Secondary | ICD-10-CM | POA: Diagnosis not present

## 2023-03-03 DIAGNOSIS — M542 Cervicalgia: Secondary | ICD-10-CM | POA: Diagnosis not present

## 2023-03-13 DIAGNOSIS — M5116 Intervertebral disc disorders with radiculopathy, lumbar region: Secondary | ICD-10-CM | POA: Diagnosis not present

## 2023-03-13 DIAGNOSIS — M5417 Radiculopathy, lumbosacral region: Secondary | ICD-10-CM | POA: Diagnosis not present

## 2023-03-18 DIAGNOSIS — M25512 Pain in left shoulder: Secondary | ICD-10-CM | POA: Diagnosis not present

## 2023-03-18 DIAGNOSIS — M25511 Pain in right shoulder: Secondary | ICD-10-CM | POA: Diagnosis not present

## 2023-03-18 DIAGNOSIS — M545 Low back pain, unspecified: Secondary | ICD-10-CM | POA: Diagnosis not present

## 2023-03-18 DIAGNOSIS — M542 Cervicalgia: Secondary | ICD-10-CM | POA: Diagnosis not present

## 2023-03-26 DIAGNOSIS — M545 Low back pain, unspecified: Secondary | ICD-10-CM | POA: Diagnosis not present

## 2023-03-26 DIAGNOSIS — M542 Cervicalgia: Secondary | ICD-10-CM | POA: Diagnosis not present

## 2023-03-26 DIAGNOSIS — M25512 Pain in left shoulder: Secondary | ICD-10-CM | POA: Diagnosis not present

## 2023-03-26 DIAGNOSIS — M25511 Pain in right shoulder: Secondary | ICD-10-CM | POA: Diagnosis not present

## 2023-04-04 DIAGNOSIS — Z23 Encounter for immunization: Secondary | ICD-10-CM | POA: Diagnosis not present

## 2023-04-04 DIAGNOSIS — M545 Low back pain, unspecified: Secondary | ICD-10-CM | POA: Diagnosis not present

## 2023-04-04 DIAGNOSIS — M25511 Pain in right shoulder: Secondary | ICD-10-CM | POA: Diagnosis not present

## 2023-04-04 DIAGNOSIS — M542 Cervicalgia: Secondary | ICD-10-CM | POA: Diagnosis not present

## 2023-04-04 DIAGNOSIS — M25512 Pain in left shoulder: Secondary | ICD-10-CM | POA: Diagnosis not present

## 2023-04-07 DIAGNOSIS — M545 Low back pain, unspecified: Secondary | ICD-10-CM | POA: Diagnosis not present

## 2023-04-07 DIAGNOSIS — M25512 Pain in left shoulder: Secondary | ICD-10-CM | POA: Diagnosis not present

## 2023-04-07 DIAGNOSIS — M542 Cervicalgia: Secondary | ICD-10-CM | POA: Diagnosis not present

## 2023-04-07 DIAGNOSIS — M25511 Pain in right shoulder: Secondary | ICD-10-CM | POA: Diagnosis not present

## 2023-04-07 NOTE — Progress Notes (Deleted)
Cardiology Office Note   Date:  04/07/2023   ID:  Sabrina Parks, DOB [birth year], MRN [id number]  PCP:  Cleatis Polka., MD  Cardiologist:   Dietrich Pates, MD   F/U of HTN     History of Present Illness: Sabrina Parks is a 82 y.o. female with a history of HTN, HL and palpitations and one episode of global amnesia (2013).  Stress test 2011 negaitve for ischemia.  Echo Nov 2013 mild LVH.  LVEF 65 to 70% with Gr I diastlic dysfunc.   MRA neg  Carotid USN neg  In 2018 EKG showed new  LBBB  Myovue done which was  normal  The pt also had  one episode of syncope  Had ILR and is followed by Rosette Reveal    I last saw the pt in Aug 2021   She was seen by Rosette Reveal since in Nov 2021  Since seen the pt denies CP   No dizziness   Remains active    No SOB   Occasional palpitations  Short lived  I saw the pt in June 2023   No outpatient medications have been marked as taking for the 04/09/23 encounter (Appointment) with [name], MD.     Allergies:   Atorvastatin, Codeine, Morphine, Morphine and codeine, Other, and Sulfa antibiotics   Past Medical History:  Diagnosis Date   Alopecia    Arthritis    Complication of anesthesia    DJD (degenerative joint disease), cervical 5/02   Elsner   GERD (gastroesophageal reflux disease)    Headache    HSV-1 infection    Hyperlipidemia    Hypertension    Hypothyroidism    Lichen sclerosus 05/22/2016   biopsy proven   Liver hemangioma 2011   [name]   PONV (postoperative nausea and vomiting)    Raynaud's disease    Thyroiditis    Transient global amnesia    not fully diag    Past Surgical History:  Procedure Laterality Date   APPENDECTOMY  1956   CARDIOVASCULAR STRESS TEST  10/29/2006   EF 78%.    CATARACT EXTRACTION Bilateral    CYSTOCELE REPAIR N/A 12/29/2018   Procedure: exam under anesthesia;  Surgeon: [name], MD;  [facility];  Service: Urology;  Laterality: N/A;   LOOP RECORDER INSERTION N/A 05/01/2017    Procedure: LOOP RECORDER INSERTION;  Surgeon: [name], MD;  [facility];  Service: Cardiovascular;  Laterality: N/A;   LUMBAR LAMINECTOMY/DECOMPRESSION MICRODISCECTOMY N/A 09/25/2020   Procedure: Lumbar two-three Lumbar three-four Lumbar four-five  Laminectomy and foraminotomy;  Surgeon: [name], MD;  Location: [facility] OR;  Service: Neurosurgery;  Laterality: N/A;   ROBOTIC ASSISTED LAPAROSCOPIC SACROCOLPOPEXY N/A 02/25/2019   Procedure: XI ROBOTIC ASSISTED LAPAROSCOPIC SACROCOLPOPEXY;  Surgeon: [name], MD;  [facility];  Service: Urology;  Laterality: N/A;   ROTATOR CUFF REPAIR Bilateral    SHOULDER SURGERY     TONSILLECTOMY AND ADENOIDECTOMY  1957   TOTAL ABDOMINAL HYSTERECTOMY W/ BILATERAL SALPINGOOPHORECTOMY  7/90   US ECHOCARDIOGRAPHY  10/11/2009   EF 55-60%     Social History:  The patient  reports that she quit smoking about 53 years ago. Her smoking use included cigarettes. She started smoking about 63 years ago. She has never used smokeless tobacco. She reports current alcohol use. She reports that she does not use drugs.   Family History:  The patient's family history includes Cancer in her father; Colon  cancer in her father; Stroke in her mother.    ROS:  Please see the history of present illness. All other systems are reviewed and  Negative to the above problem except as noted.    PHYSICAL EXAM: VS:  LMP 07/29/1990    GEN: Well nourished, well developed, in no acute distress  HEENT: normal  Neck: JVP normal  No carotid bruits Cardiac: RRR; no murmurs;  No LE edema  Respiratory:  clear to auscultation bilaterally GI: soft, nontender, nondistended, + BS  No hepatomegaly  MS: no deformity Moving all extremities   Skin: warm and dry, no rash  Pt wearing hat had Bx on scalp Neuro:  Strength and sensation are intact Psych: euthymic mood, full affect   EKG:  EKG is  done  NSR  74 bpm  LBBB  Lipid Panel    Component Value Date/Time    CHOL 224 (H) 03/27/2020 1158   TRIG 152 (H) 03/27/2020 1158   HDL 91 03/27/2020 1158   CHOLHDL 2.5 03/27/2020 1158   LDLCALC 107 (H) 03/27/2020 1158      Wt Readings from Last 3 Encounters:  01/23/22 137 lb (62.1 kg)  08/29/21 137 lb 3.2 oz (62.2 kg)  07/16/21 135 lb 12.8 oz (61.6 kg)      ASSESSMENT AND PLAN:  1  HTN   BP is controlled  Continue to follow on current meds    2  Dizziness/ syncope   Pt had a ILR in past that never documented a problem    Denies symptoms      3  Palpitations  / heart racing     Denies   4   LBBB  Old    5  HL  LDL 105, in 2022  Will get labs from Dr Clelia Croft     I encouraged her to get back to water aerobics  She loved it in the past     F/U in clinicin about 12 months   Current medicines are reviewed at length with the patient today.  The patient does not have concerns regarding medicines.  Signed, Dietrich Pates, MD  04/07/2023 9:06 PM    [facility] [address] [place], [place], [place]  [address] Phone: [phone]; Fax: [phone]

## 2023-04-09 ENCOUNTER — Ambulatory Visit: Payer: Medicare Other | Admitting: Internal Medicine

## 2023-04-10 NOTE — Progress Notes (Signed)
Cardiology Office Note   Date:  04/11/2023   ID:  Sabrina Parks, DOB August 27, 1940, MRN [id number]  PCP:  Cleatis Polka., MD  Cardiologist:   Dietrich Pates, MD   F/U of HTN     History of Present Illness: Sabrina Parks is a 82 y.o. female with a history of HTN, HL and palpitations, one episode of global amnesia (2013). LBBB and syncope  Echo Nov 2013 mild LVH.  LVEF 65 to 70% with Gr I diastlic dysfunc.   MRA neg  Carotid USN neg  In 2018 EKG showed new  LBBB  Myovue done which was  normal  The pt also had  one episode of syncope  Had ILR and is followed by Rosette Reveal    I saw the pt in June 2023   Since seen she denies dizziness   No SOB  No palpitations   No CP    She is walking    Does not have time to go into pool for exercise    Current Meds  Medication Sig   acetaminophen (TYLENOL) 500 MG tablet Take 500 mg by mouth every 6 (six) hours as needed for moderate pain or headache.   amitriptyline (ELAVIL) 25 MG tablet Take 25 mg by mouth at bedtime.    aspirin EC 81 MG tablet Take 81 mg by mouth at bedtime.   Biotin 1 MG CAPS 3 (three) times a week.   Cholecalciferol (VITAMIN D) 50 MCG (2000 UT) tablet Take 2,000 Units by mouth daily.   clobetasol ointment (TEMOVATE) 0.05 % Apply 1 application topically 2 (two) times daily. Apply as directed twice daily.  Do not use for more than 5 days.   estradiol (ESTRACE VAGINAL) 0.1 MG/GM vaginal cream Apply pea size amount topically daily.   famotidine (PEPCID) 10 MG tablet Take 10 mg by mouth daily as needed for heartburn or indigestion.   fluticasone (FLONASE) 50 MCG/ACT nasal spray Place 1 spray into both nostrils daily as needed for allergies.   ibuprofen (ADVIL,MOTRIN) 200 MG tablet Take 200 mg by mouth every 8 (eight) hours as needed for headache or moderate pain.    levothyroxine (SYNTHROID, LEVOTHROID) 50 MCG tablet Take 50 mcg by mouth daily before breakfast.   LORazepam (ATIVAN) 0.5 MG tablet Take 0.5 mg by mouth daily as needed for  anxiety.    mometasone (ELOCON) 0.1 % ointment Apply topically twice weekly   olmesartan (BENICAR) 40 MG tablet Take 40 mg by mouth at bedtime.    omeprazole (PRILOSEC) 20 MG capsule Take 20 mg by mouth daily.   ondansetron (ZOFRAN) 4 MG tablet Take 4 mg by mouth every 8 (eight) hours as needed for nausea/vomiting.   PROAIR HFA 108 (90 BASE) MCG/ACT inhaler Inhale 2 puffs into the lungs every 6 (six) hours as needed for wheezing or shortness of breath.    rosuvastatin (CRESTOR) 10 MG tablet Take 10 mg by mouth at bedtime.   triamcinolone (NASACORT) 55 MCG/ACT AERO nasal inhaler Place 1 spray into the nose daily as needed (for allergies).   triamterene-hydrochlorothiazide (MAXZIDE-25) 37.5-25 MG tablet TAKE ONE-HALF (1/2) TABLET DAILY (Patient taking differently: Take 0.5 tablets by mouth daily.)     Allergies:   Atorvastatin, Codeine, Morphine, Morphine and codeine, Other, and Sulfa antibiotics   Past Medical History:  Diagnosis Date   Alopecia    Arthritis    Complication of anesthesia    DJD (degenerative joint disease), cervical 5/02   Elsner   GERD (gastroesophageal  reflux disease)    Headache    HSV-1 infection    Hyperlipidemia    Hypertension    Hypothyroidism    Lichen sclerosus 05/22/2016   biopsy proven   Liver hemangioma 2011   [name]   PONV (postoperative nausea and vomiting)    Raynaud's disease    Thyroiditis    Transient global amnesia    not fully diag    Past Surgical History:  Procedure Laterality Date   APPENDECTOMY  1956   CARDIOVASCULAR STRESS TEST  10/29/2006   EF 78%.    CATARACT EXTRACTION Bilateral    CYSTOCELE REPAIR N/A 12/29/2018   Procedure: exam under anesthesia;  Surgeon: [name], MD;  Location: [facility];  Service: Urology;  Laterality: N/A;   LOOP RECORDER INSERTION N/A 05/01/2017   Procedure: LOOP RECORDER INSERTION;  Surgeon: [name], MD;  [facility];  Service: Cardiovascular;  Laterality: N/A;   LUMBAR  LAMINECTOMY/DECOMPRESSION MICRODISCECTOMY N/A 09/25/2020   Procedure: Lumbar two-three Lumbar three-four Lumbar four-five  Laminectomy and foraminotomy;  Surgeon: [name], MD;  Location: [facility];  Service: Neurosurgery;  Laterality: N/A;   ROBOTIC ASSISTED LAPAROSCOPIC SACROCOLPOPEXY N/A 02/25/2019   Procedure: XI ROBOTIC ASSISTED LAPAROSCOPIC SACROCOLPOPEXY;  Surgeon: [name], MD;  Location: [facility];  Service: Urology;  Laterality: N/A;   ROTATOR CUFF REPAIR Bilateral    SHOULDER SURGERY     TONSILLECTOMY AND ADENOIDECTOMY  1957   TOTAL ABDOMINAL HYSTERECTOMY W/ BILATERAL SALPINGOOPHORECTOMY  7/90   US ECHOCARDIOGRAPHY  10/11/2009   EF 55-60%     Social History:  The patient  reports that she quit smoking about 53 years ago. Her smoking use included cigarettes. She started smoking about 63 years ago. She has never used smokeless tobacco. She reports current alcohol use. She reports that she does not use drugs.   Family History:  The patient's family history includes Cancer in her father; Colon cancer in her father; Stroke in her mother.    ROS:  Please see the history of present illness. All other systems are reviewed and  Negative to the above problem except as noted.    PHYSICAL EXAM: VS:  BP 114/62 (BP Location: Left Arm, Patient Position: Sitting, Cuff Size: Normal)   Pulse 71   Ht 5' 3.75" (1.619 m)   Wt 134 lb 9.6 oz (61.1 kg)   LMP 07/29/1990   SpO2 99%   BMI 23.29 kg/m    GEN: Well nourished, well developed, in no acute distress  HEENT: normal  Neck: JVP normal  No carotid bruits Cardiac: RRR; no murmur;  No LE edema  Respiratory:  clear to auscultation bilaterally GI: soft, nontender  No hepatomegaly    EKG:  EKG is  done  NSR   LBBB  Lipid Panel    Component Value Date/Time   CHOL 224 (H) 03/27/2020 1158   TRIG 152 (H) 03/27/2020 1158   HDL 91 03/27/2020 1158   CHOLHDL 2.5 03/27/2020 1158   LDLCALC 107 (H) 03/27/2020 1158      Wt Readings  from Last 3 Encounters:  04/11/23 134 lb 9.6 oz (61.1 kg)  01/23/22 137 lb (62.1 kg)  08/29/21 137 lb 3.2 oz (62.2 kg)      ASSESSMENT AND PLAN:  1  HTN   BP is well controlled    2  Syncope  Pt with LBBB  Had only one syncopal spell.  ILR placed   No events   Battery is  dead     3  Hx of palpitations  / heart racing     Pt denies    Follow   4   LBBB    5  HL  Will check lipomed, Lpa and ApoB  I encoureaged her to keep active   Work on muscle tone     Will check CBC, TSH, BMET, lipomed, ApoB, Lpa and Vit D  F/U in clinicin about 12 months   Current medicines are reviewed at length with the patient today.  The patient does not have concerns regarding medicines.  Signed, Dietrich Pates, MD  04/11/2023 9:52 PM    [facility] [address] [place], [place], [place]  [address] Phone: [phone]; Fax: [phone]

## 2023-04-11 ENCOUNTER — Ambulatory Visit: Payer: Medicare Other | Attending: Internal Medicine | Admitting: Internal Medicine

## 2023-04-11 ENCOUNTER — Encounter: Payer: Self-pay | Admitting: Internal Medicine

## 2023-04-11 VITALS — BP 114/62 | HR 71 | Ht 63.75 in | Wt 134.6 lb

## 2023-04-11 DIAGNOSIS — R002 Palpitations: Secondary | ICD-10-CM | POA: Diagnosis not present

## 2023-04-11 DIAGNOSIS — M1611 Unilateral primary osteoarthritis, right hip: Secondary | ICD-10-CM | POA: Insufficient documentation

## 2023-04-11 DIAGNOSIS — E039 Hypothyroidism, unspecified: Secondary | ICD-10-CM | POA: Diagnosis not present

## 2023-04-11 DIAGNOSIS — Z79899 Other long term (current) drug therapy: Secondary | ICD-10-CM | POA: Insufficient documentation

## 2023-04-11 DIAGNOSIS — M170 Bilateral primary osteoarthritis of knee: Secondary | ICD-10-CM | POA: Insufficient documentation

## 2023-04-11 DIAGNOSIS — E785 Hyperlipidemia, unspecified: Secondary | ICD-10-CM | POA: Insufficient documentation

## 2023-04-11 DIAGNOSIS — I447 Left bundle-branch block, unspecified: Secondary | ICD-10-CM | POA: Diagnosis not present

## 2023-04-11 DIAGNOSIS — I10 Essential (primary) hypertension: Secondary | ICD-10-CM | POA: Insufficient documentation

## 2023-04-11 NOTE — Patient Instructions (Signed)
Medication Instructions:   Your physician recommends that you continue on your current medications as directed. Please refer to the Current Medication list given to you today.  *If you need a refill on your cardiac medications before your next appointment, please call your pharmacy*   Lab Work:  TODAY--CBC W DIFF, BMET, TSH LEVEL, VITAMIN D LEVEL, NMR LIPOPROFILE, APOLIPOPROTEIN B, LIPOPROTEIN A  If you have labs (blood work) drawn today and your tests are completely normal, you will receive your results only by: MyChart Message (if you have MyChart) OR A paper copy in the mail If you have any lab test that is abnormal or we need to change your treatment, we will call you to review the results.   Follow-Up: At [facility], you and your health needs are our priority.  As part of our continuing mission to provide you with exceptional heart care, we have created designated Provider Care Teams.  These Care Teams include your primary Cardiologist (physician) and Advanced Practice Providers (APPs -  Physician Assistants and Nurse Practitioners) who all work together to provide you with the care you need, when you need it.  We recommend signing up for the patient portal called "MyChart".  Sign up information is provided on this After Visit Summary.  MyChart is used to connect with patients for Virtual Visits (Telemedicine).  Patients are able to view lab/test results, encounter notes, upcoming appointments, etc.  Non-urgent messages can be sent to your provider as well.   To learn more about what you can do with MyChart, go to [email or web address].    Your next appointment:   1 year(s)  Provider:   Dietrich Pates, MD

## 2023-04-14 DIAGNOSIS — M25511 Pain in right shoulder: Secondary | ICD-10-CM | POA: Diagnosis not present

## 2023-04-14 DIAGNOSIS — M25512 Pain in left shoulder: Secondary | ICD-10-CM | POA: Diagnosis not present

## 2023-04-14 DIAGNOSIS — M542 Cervicalgia: Secondary | ICD-10-CM | POA: Diagnosis not present

## 2023-04-14 DIAGNOSIS — M545 Low back pain, unspecified: Secondary | ICD-10-CM | POA: Diagnosis not present

## 2023-04-15 LAB — BASIC METABOLIC PANEL
BUN/Creatinine Ratio: 23 (ref 12–28)
BUN: 18 mg/dL (ref 8–27)
CO2: 25 mmol/L (ref 20–29)
Calcium: 10.5 mg/dL — ABNORMAL HIGH (ref 8.7–10.3)
Chloride: 92 mmol/L — ABNORMAL LOW (ref 96–106)
Creatinine, Ser: 0.79 mg/dL (ref 0.57–1.00)
Glucose: 81 mg/dL (ref 70–99)
Potassium: 4.9 mmol/L (ref 3.5–5.2)
Sodium: 130 mmol/L — ABNORMAL LOW (ref 134–144)
eGFR: 75 mL/min/{1.73_m2} (ref >59)

## 2023-04-15 LAB — NMR, LIPOPROFILE
Cholesterol, Total: 197 mg/dL (ref 100–199)
HDL Particle Number: 52.5 umol/L (ref >=30.5)
HDL-C: 87 mg/dL (ref >39)
LDL Particle Number: 883 nmol/L (ref <1000)
LDL Size: 21.9 nm (ref >20.5)
LDL-C (NIH Calc): 92 mg/dL (ref 0–99)
LP-IR Score: 25 (ref <=45)
Small LDL Particle Number: 316 nmol/L (ref <=527)
Triglycerides: 102 mg/dL (ref 0–149)

## 2023-04-15 LAB — CBC WITH DIFFERENTIAL/PLATELET
Basophils Absolute: 0 10*3/uL (ref 0.0–0.2)
Basos: 1 %
EOS (ABSOLUTE): 0.1 10*3/uL (ref 0.0–0.4)
Eos: 2 %
Hematocrit: 35.8 % (ref 34.0–46.6)
Hemoglobin: 11.9 g/dL (ref 11.1–15.9)
Immature Grans (Abs): 0 10*3/uL (ref 0.0–0.1)
Immature Granulocytes: 0 %
Lymphocytes Absolute: 1.9 10*3/uL (ref 0.7–3.1)
Lymphs: 27 %
MCH: 28.6 pg (ref 26.6–33.0)
MCHC: 33.2 g/dL (ref 31.5–35.7)
MCV: 86 fL (ref 79–97)
Monocytes Absolute: 0.7 10*3/uL (ref 0.1–0.9)
Monocytes: 11 %
Neutrophils Absolute: 4.1 10*3/uL (ref 1.4–7.0)
Neutrophils: 59 %
Platelets: 323 10*3/uL (ref 150–450)
RBC: 4.16 x10E6/uL (ref 3.77–5.28)
RDW: 13.7 % (ref 11.7–15.4)
WBC: 6.9 10*3/uL (ref 3.4–10.8)

## 2023-04-15 LAB — APOLIPOPROTEIN B: Apolipoprotein B: 84 mg/dL (ref <90)

## 2023-04-15 LAB — LIPOPROTEIN A (LPA): Lipoprotein (a): 216.4 nmol/L — ABNORMAL HIGH (ref <75.0)

## 2023-04-15 LAB — TSH: TSH: 1.21 u[IU]/mL (ref 0.450–4.500)

## 2023-04-15 LAB — VITAMIN D 25 HYDROXY (VIT D DEFICIENCY, FRACTURES): Vit D, 25-Hydroxy: 59.7 ng/mL (ref 30.0–100.0)

## 2023-04-17 ENCOUNTER — Other Ambulatory Visit: Payer: Self-pay

## 2023-04-17 DIAGNOSIS — Z79899 Other long term (current) drug therapy: Secondary | ICD-10-CM

## 2023-04-17 DIAGNOSIS — E785 Hyperlipidemia, unspecified: Secondary | ICD-10-CM

## 2023-04-17 DIAGNOSIS — I10 Essential (primary) hypertension: Secondary | ICD-10-CM

## 2023-04-17 DIAGNOSIS — E871 Hypo-osmolality and hyponatremia: Secondary | ICD-10-CM

## 2023-04-17 MED ORDER — ROSUVASTATIN CALCIUM 20 MG PO TABS: 20.0000 mg | ORAL_TABLET | Freq: Every day | ORAL | 3 refills | Status: DC

## 2023-04-18 DIAGNOSIS — R932 Abnormal findings on diagnostic imaging of liver and biliary tract: Secondary | ICD-10-CM | POA: Diagnosis not present

## 2023-04-18 DIAGNOSIS — K5901 Slow transit constipation: Secondary | ICD-10-CM | POA: Diagnosis not present

## 2023-04-18 DIAGNOSIS — Z86010 Personal history of colonic polyps: Secondary | ICD-10-CM | POA: Diagnosis not present

## 2023-04-18 DIAGNOSIS — R110 Nausea: Secondary | ICD-10-CM | POA: Diagnosis not present

## 2023-04-23 DIAGNOSIS — D692 Other nonthrombocytopenic purpura: Secondary | ICD-10-CM | POA: Diagnosis not present

## 2023-04-23 DIAGNOSIS — L821 Other seborrheic keratosis: Secondary | ICD-10-CM | POA: Diagnosis not present

## 2023-04-23 DIAGNOSIS — L570 Actinic keratosis: Secondary | ICD-10-CM | POA: Diagnosis not present

## 2023-04-23 DIAGNOSIS — M25511 Pain in right shoulder: Secondary | ICD-10-CM | POA: Diagnosis not present

## 2023-04-23 DIAGNOSIS — M25512 Pain in left shoulder: Secondary | ICD-10-CM | POA: Diagnosis not present

## 2023-04-23 DIAGNOSIS — Z85828 Personal history of other malignant neoplasm of skin: Secondary | ICD-10-CM | POA: Diagnosis not present

## 2023-04-23 DIAGNOSIS — M542 Cervicalgia: Secondary | ICD-10-CM | POA: Diagnosis not present

## 2023-04-23 DIAGNOSIS — D1801 Hemangioma of skin and subcutaneous tissue: Secondary | ICD-10-CM | POA: Diagnosis not present

## 2023-04-23 DIAGNOSIS — M545 Low back pain, unspecified: Secondary | ICD-10-CM | POA: Diagnosis not present

## 2023-04-30 DIAGNOSIS — R058 Other specified cough: Secondary | ICD-10-CM | POA: Diagnosis not present

## 2023-04-30 DIAGNOSIS — J45998 Other asthma: Secondary | ICD-10-CM | POA: Diagnosis not present

## 2023-04-30 DIAGNOSIS — J209 Acute bronchitis, unspecified: Secondary | ICD-10-CM | POA: Diagnosis not present

## 2023-05-06 DIAGNOSIS — H353211 Exudative age-related macular degeneration, right eye, with active choroidal neovascularization: Secondary | ICD-10-CM | POA: Diagnosis not present

## 2023-05-07 DIAGNOSIS — M4712 Other spondylosis with myelopathy, cervical region: Secondary | ICD-10-CM | POA: Diagnosis not present

## 2023-05-09 ENCOUNTER — Ambulatory Visit: Payer: Medicare Other | Attending: Internal Medicine

## 2023-05-09 DIAGNOSIS — E785 Hyperlipidemia, unspecified: Secondary | ICD-10-CM | POA: Diagnosis not present

## 2023-05-09 DIAGNOSIS — E871 Hypo-osmolality and hyponatremia: Secondary | ICD-10-CM | POA: Diagnosis not present

## 2023-05-09 DIAGNOSIS — Z79899 Other long term (current) drug therapy: Secondary | ICD-10-CM | POA: Diagnosis not present

## 2023-05-09 DIAGNOSIS — I10 Essential (primary) hypertension: Secondary | ICD-10-CM | POA: Diagnosis not present

## 2023-05-09 LAB — BASIC METABOLIC PANEL
BUN/Creatinine Ratio: 23 (ref 12–28)
BUN: 17 mg/dL (ref 8–27)
CO2: 24 mmol/L (ref 20–29)
Calcium: 10.3 mg/dL (ref 8.7–10.3)
Chloride: 96 mmol/L (ref 96–106)
Creatinine, Ser: 0.74 mg/dL (ref 0.57–1.00)
Glucose: 83 mg/dL (ref 70–99)
Potassium: 5 mmol/L (ref 3.5–5.2)
Sodium: 133 mmol/L — ABNORMAL LOW (ref 134–144)
eGFR: 81 mL/min/{1.73_m2} (ref >59)

## 2023-05-19 ENCOUNTER — Other Ambulatory Visit: Payer: Self-pay | Admitting: Neurological Surgery

## 2023-05-21 ENCOUNTER — Other Ambulatory Visit: Payer: Self-pay | Admitting: Neurological Surgery

## 2023-05-21 DIAGNOSIS — J309 Allergic rhinitis, unspecified: Secondary | ICD-10-CM | POA: Diagnosis not present

## 2023-05-21 DIAGNOSIS — J45998 Other asthma: Secondary | ICD-10-CM | POA: Diagnosis not present

## 2023-05-21 DIAGNOSIS — R058 Other specified cough: Secondary | ICD-10-CM | POA: Diagnosis not present

## 2023-05-23 NOTE — Pre-Procedure Instructions (Signed)
Surgical Instructions   Your procedure is scheduled on Thursday, October 31st. Report to [facility] Main Entrance "A" at 05:30 A.M., then check in with the Admitting office. Any questions or running late day of surgery: call [phone]  Questions prior to your surgery date: call [phone], Monday-Friday, 8am-4pm. If you experience any cold or flu symptoms such as cough, fever, chills, shortness of breath, etc. between now and your scheduled surgery, please notify us at the above number.     Remember:  Do not eat or drink after midnight the night before your surgery    Take these medicines the morning of surgery with A SIP OF WATER  levothyroxine (SYNTHROID, LEVOTHROID)  pantoprazole (PROTONIX)  rosuvastatin (CRESTOR)    May take these medicines IF NEEDED: acetaminophen (TYLENOL)  fluticasone (FLONASE)  LORazepam (ATIVAN)  ondansetron (ZOFRAN)  PROAIR HFA- bring inhaler with you on day of surgery triamcinolone (NASACORT)    Follow your surgeon's instructions on when to stop Aspirin.  If no instructions were given by your surgeon then you will need to call the office to get those instructions.    One week prior to surgery, STOP taking any Aleve, Naproxen, Ibuprofen, Motrin, Advil, Goody's, BC's, all herbal medications, fish oil, and non-prescription vitamins.                     Do NOT Smoke (Tobacco/Vaping) for 24 hours prior to your procedure.  If you use a CPAP at night, you may bring your mask/headgear for your overnight stay.   You will be asked to remove any contacts, glasses, piercing's, hearing aid's, dentures/partials prior to surgery. Please bring cases for these items if needed.    Patients discharged the day of surgery will not be allowed to drive home, and someone needs to stay with them for 24 hours.  SURGICAL WAITING ROOM VISITATION Patients may have no more than 2 support people in the waiting area - these visitors may rotate.   Pre-op nurse will  coordinate an appropriate time for 1 ADULT support person, who may not rotate, to accompany patient in pre-op.  Children under the age of 32 must have an adult with them who is not the patient and must remain in the main waiting area with an adult.  If the patient needs to stay at the hospital during part of their recovery, the visitor guidelines for inpatient rooms apply.  Please refer to the [facility] website for the visitor guidelines for any additional information.   If you received a COVID test during your pre-op visit  it is requested that you wear a mask when out in public, stay away from anyone that may not be feeling well and notify your surgeon if you develop symptoms. If you have been in contact with anyone that has tested positive in the last 10 days please notify you surgeon.      Pre-operative 5 CHG Bathing Instructions   You can play a key role in reducing the risk of infection after surgery. Your skin needs to be as free of germs as possible. You can reduce the number of germs on your skin by washing with CHG (chlorhexidine gluconate) soap before surgery. CHG is an antiseptic soap that kills germs and continues to kill germs even after washing.   DO NOT use if you have an allergy to chlorhexidine/CHG or antibacterial soaps. If your skin becomes reddened or irritated, stop using the CHG and notify one of our RNs at [phone].  Please shower with the CHG soap starting 4 days before surgery using the following schedule:     Please keep in mind the following:  DO NOT shave, including legs and underarms, starting the day of your first shower.   You may shave your face at any point before/day of surgery.  Place clean sheets on your bed the day you start using CHG soap. Use a clean washcloth (not used since being washed) for each shower. DO NOT sleep with pets once you start using the CHG.   CHG Shower Instructions:  Wash your face and private area with normal soap. If  you choose to wash your hair, wash first with your normal shampoo.  After you use shampoo/soap, rinse your hair and body thoroughly to remove shampoo/soap residue.  Turn the water OFF and apply about 3 tablespoons (45 ml) of CHG soap to a CLEAN washcloth.  Apply CHG soap ONLY FROM YOUR NECK DOWN TO YOUR TOES (washing for 3-5 minutes)  DO NOT use CHG soap on face, private areas, open wounds, or sores.  Pay special attention to the area where your surgery is being performed.  If you are having back surgery, having someone wash your back for you may be helpful. Wait 2 minutes after CHG soap is applied, then you may rinse off the CHG soap.  Pat dry with a clean towel  Put on clean clothes/pajamas   If you choose to wear lotion, please use ONLY the CHG-compatible lotions on the back of this paper.   Additional instructions for the day of surgery: DO NOT APPLY any lotions, deodorants, cologne, or perfumes.   Do not bring valuables to the hospital. [facility] is not responsible for any belongings/valuables. Do not wear nail polish, gel polish, artificial nails, or any other type of covering on natural nails (fingers and toes) Do not wear jewelry or makeup Put on clean/comfortable clothes.  Please brush your teeth.  Ask your nurse before applying any prescription medications to the skin.     CHG Compatible Lotions   Aveeno Moisturizing lotion  Cetaphil Moisturizing Cream  Cetaphil Moisturizing Lotion  Clairol Herbal Essence Moisturizing Lotion, Dry Skin  Clairol Herbal Essence Moisturizing Lotion, Extra Dry Skin  Clairol Herbal Essence Moisturizing Lotion, Normal Skin  Curel Age Defying Therapeutic Moisturizing Lotion with Alpha Hydroxy  Curel Extreme Care Body Lotion  Curel Soothing Hands Moisturizing Hand Lotion  Curel Therapeutic Moisturizing Cream, Fragrance-Free  Curel Therapeutic Moisturizing Lotion, Fragrance-Free  Curel Therapeutic Moisturizing Lotion, Original Formula  Eucerin  Daily Replenishing Lotion  Eucerin Dry Skin Therapy Plus Alpha Hydroxy Crme  Eucerin Dry Skin Therapy Plus Alpha Hydroxy Lotion  Eucerin Original Crme  Eucerin Original Lotion  Eucerin Plus Crme Eucerin Plus Lotion  Eucerin TriLipid Replenishing Lotion  Keri Anti-Bacterial Hand Lotion  Keri Deep Conditioning Original Lotion Dry Skin Formula Softly Scented  Keri Deep Conditioning Original Lotion, Fragrance Free Sensitive Skin Formula  Keri Lotion Fast Absorbing Fragrance Free Sensitive Skin Formula  Keri Lotion Fast Absorbing Softly Scented Dry Skin Formula  Keri Original Lotion  Keri Skin Renewal Lotion Keri Silky Smooth Lotion  Keri Silky Smooth Sensitive Skin Lotion  Nivea Body Creamy Conditioning Oil  Nivea Body Extra Enriched Teacher, adult education Moisturizing Lotion Nivea Crme  Nivea Skin Firming Lotion  NutraDerm 30 Skin Lotion  NutraDerm Skin Lotion  NutraDerm Therapeutic Skin Cream  NutraDerm Therapeutic Skin Lotion  ProShield Protective Hand Cream  Provon moisturizing lotion  Please read over the following fact sheets that you were given.

## 2023-05-24 DIAGNOSIS — Z23 Encounter for immunization: Secondary | ICD-10-CM | POA: Diagnosis not present

## 2023-05-26 ENCOUNTER — Encounter (HOSPITAL_COMMUNITY): Payer: Self-pay

## 2023-05-26 ENCOUNTER — Encounter (HOSPITAL_COMMUNITY)
Admission: RE | Admit: 2023-05-26 | Discharge: 2023-05-26 | Disposition: A | Discharge status: Home / Self Care | Payer: Medicare Other | Source: Ambulatory Visit | Attending: Neurological Surgery | Admitting: Neurological Surgery

## 2023-05-26 ENCOUNTER — Other Ambulatory Visit: Payer: Self-pay

## 2023-05-26 VITALS — BP 159/58 | HR 106 | Temp 98.2°F | Resp 17 | Ht 63.5 in | Wt 133.6 lb

## 2023-05-26 DIAGNOSIS — I7300 Raynaud's syndrome without gangrene: Secondary | ICD-10-CM | POA: Diagnosis present

## 2023-05-26 DIAGNOSIS — M4722 Other spondylosis with radiculopathy, cervical region: Secondary | ICD-10-CM | POA: Diagnosis not present

## 2023-05-26 DIAGNOSIS — F419 Anxiety disorder, unspecified: Secondary | ICD-10-CM | POA: Diagnosis present

## 2023-05-26 DIAGNOSIS — Z85828 Personal history of other malignant neoplasm of skin: Secondary | ICD-10-CM | POA: Diagnosis not present

## 2023-05-26 DIAGNOSIS — I10 Essential (primary) hypertension: Secondary | ICD-10-CM | POA: Insufficient documentation

## 2023-05-26 DIAGNOSIS — Z87891 Personal history of nicotine dependence: Secondary | ICD-10-CM | POA: Diagnosis not present

## 2023-05-26 DIAGNOSIS — Z885 Allergy status to narcotic agent status: Secondary | ICD-10-CM | POA: Diagnosis not present

## 2023-05-26 DIAGNOSIS — M4712 Other spondylosis with myelopathy, cervical region: Secondary | ICD-10-CM | POA: Diagnosis not present

## 2023-05-26 DIAGNOSIS — Z01812 Encounter for preprocedural laboratory examination: Secondary | ICD-10-CM | POA: Insufficient documentation

## 2023-05-26 DIAGNOSIS — Z800 Family history of malignant neoplasm of digestive organs: Secondary | ICD-10-CM | POA: Diagnosis not present

## 2023-05-26 DIAGNOSIS — Z981 Arthrodesis status: Secondary | ICD-10-CM | POA: Diagnosis not present

## 2023-05-26 DIAGNOSIS — K219 Gastro-esophageal reflux disease without esophagitis: Secondary | ICD-10-CM | POA: Insufficient documentation

## 2023-05-26 DIAGNOSIS — D1803 Hemangioma of intra-abdominal structures: Secondary | ICD-10-CM | POA: Insufficient documentation

## 2023-05-26 DIAGNOSIS — Z90710 Acquired absence of both cervix and uterus: Secondary | ICD-10-CM | POA: Diagnosis not present

## 2023-05-26 DIAGNOSIS — Z01818 Encounter for other preprocedural examination: Secondary | ICD-10-CM

## 2023-05-26 DIAGNOSIS — E785 Hyperlipidemia, unspecified: Secondary | ICD-10-CM | POA: Diagnosis present

## 2023-05-26 DIAGNOSIS — Z882 Allergy status to sulfonamides status: Secondary | ICD-10-CM | POA: Diagnosis not present

## 2023-05-26 DIAGNOSIS — I447 Left bundle-branch block, unspecified: Secondary | ICD-10-CM | POA: Diagnosis present

## 2023-05-26 DIAGNOSIS — E039 Hypothyroidism, unspecified: Secondary | ICD-10-CM | POA: Diagnosis not present

## 2023-05-26 DIAGNOSIS — E871 Hypo-osmolality and hyponatremia: Secondary | ICD-10-CM | POA: Diagnosis present

## 2023-05-26 DIAGNOSIS — M4802 Spinal stenosis, cervical region: Secondary | ICD-10-CM | POA: Diagnosis not present

## 2023-05-26 DIAGNOSIS — Z823 Family history of stroke: Secondary | ICD-10-CM | POA: Diagnosis not present

## 2023-05-26 HISTORY — DX: Malignant (primary) neoplasm, unspecified: C80.1

## 2023-05-26 HISTORY — DX: Left bundle-branch block, unspecified: I44.7

## 2023-05-26 HISTORY — DX: Anxiety disorder, unspecified: F41.9

## 2023-05-26 LAB — CBC
HCT: 40.3 % (ref 36.0–46.0)
Hemoglobin: 13 g/dL (ref 12.0–15.0)
MCH: 28.9 pg (ref 26.0–34.0)
MCHC: 32.3 g/dL (ref 30.0–36.0)
MCV: 89.6 fL (ref 80.0–100.0)
Platelets: 377 10*3/uL (ref 150–400)
RBC: 4.5 MIL/uL (ref 3.87–5.11)
RDW: 14.3 % (ref 11.5–15.5)
WBC: 13.9 10*3/uL — ABNORMAL HIGH (ref 4.0–10.5)
nRBC: 0 % (ref 0.0–0.2)

## 2023-05-26 LAB — COMPREHENSIVE METABOLIC PANEL
ALT: 45 U/L — ABNORMAL HIGH (ref 0–44)
AST: 28 U/L (ref 15–41)
Albumin: 3.9 g/dL (ref 3.5–5.0)
Alkaline Phosphatase: 108 U/L (ref 38–126)
Anion gap: 9 (ref 5–15)
BUN: 13 mg/dL (ref 8–23)
CO2: 21 mmol/L — ABNORMAL LOW (ref 22–32)
Calcium: 9.5 mg/dL (ref 8.9–10.3)
Chloride: 101 mmol/L (ref 98–111)
Creatinine, Ser: 0.85 mg/dL (ref 0.44–1.00)
GFR, Estimated: >60 mL/min (ref >60)
Glucose, Bld: 166 mg/dL — ABNORMAL HIGH (ref 70–99)
Potassium: 4 mmol/L (ref 3.5–5.1)
Sodium: 131 mmol/L — ABNORMAL LOW (ref 135–145)
Total Bilirubin: 0.8 mg/dL (ref 0.3–1.2)
Total Protein: 6.9 g/dL (ref 6.5–8.1)

## 2023-05-26 LAB — TYPE AND SCREEN
ABO/RH(D): B POS
Antibody Screen: NEGATIVE

## 2023-05-26 LAB — SURGICAL PCR SCREEN
MRSA, PCR: NEGATIVE
Staphylococcus aureus: NEGATIVE

## 2023-05-26 NOTE — Progress Notes (Signed)
Anesthesia Chart Review:  82 year old female with a history of HTN, HLD, palpitations, one episode of global amnesia (2013), LBBB, and syncope  Echo Nov 2013 mild LVH,  LVEF 65 to 70% with grade 1dd. Carotid duplex negative. In 2018 EKG showed new  LBBB.  Myovue done which was normal. Previously had ILR placed due to episode of syncope, no events captured, battery is dead. Last seen by Dr. Tenny Craw 2023-05-02, stable at that time, HTN well controlled, no changes to management. 12 month followup recommended.   Other pertinent history includes postoperative nausea and vomiting, GERD, Raynaud's, hypothyroid, headaches.  Preop labs reviewed, mild chronic hyponatremia sodium 131, WBC mildly elevated at 13.9, otherwise unremarkable.  EKG 05-02-2023: NSR. Rate 71. LBBB.  14d event monitor 01/15/23: Rhythm:  SInus rhythm  Rates 51 to 133 bpm   Average HR 69 bpm Rare PACs, rare PVCs 3 short bursts of SVT, fastest for 4 beats (133 bpm), longest for 5 beats.   Triggered events corresponded to Sinus rhythm   NO significant arrhythmias documented  Nuclear stress 10/29/16: Nuclear stress EF: 68%. There was no ST segment deviation noted during stress. Defect 1: There is a small defect of moderate severity present in the apical septal location.   Low risk stress nuclear study with a fixed apicoseptal defect that appears most likely to be an artifact. Normal left ventricular regional and global systolic function.    Zannie Cove [facility] Short Stay Center/Anesthesiology Phone [phone] 05/26/2023 4:47 PM

## 2023-05-26 NOTE — Anesthesia Preprocedure Evaluation (Signed)
Anesthesia Evaluation  Patient identified by MRN, date of birth, ID band Patient awake    Reviewed: Allergy & Precautions, NPO status , Patient's Chart, lab work & pertinent test results  History of Anesthesia Complications (+) PONV and history of anesthetic complications  Airway Mallampati: II  TM Distance: >3 FB Neck ROM: Limited    Dental   Pulmonary neg pulmonary ROS, former smoker   Pulmonary exam normal        Cardiovascular hypertension, Pt. on medications  Rhythm:Regular Rate:Normal     Neuro/Psych  Headaches  Anxiety     TIA   GI/Hepatic Neg liver ROS,GERD  Medicated,,  Endo/Other  Hypothyroidism    Renal/GU   negative genitourinary   Musculoskeletal  (+) Arthritis , Osteoarthritis,    Abdominal Normal abdominal exam  (+)   Peds  Hematology Lab Results      Component                Value               Date                      WBC                      13.9 (H)            05/26/2023                HGB                      13.0                05/26/2023                HCT                      40.3                05/26/2023                MCV                      89.6                05/26/2023                PLT                      377                 05/26/2023             Lab Results      Component                Value               Date                      NA                       131 (L)             05/26/2023                K  4.0                 05/26/2023                CO2                      21 (L)              05/26/2023                GLUCOSE                  166 (H)             05/26/2023                BUN                      13                  05/26/2023                CREATININE               0.85                05/26/2023                CALCIUM                  9.5                 05/26/2023                GFR                      79.64               12/29/2012                 EGFR                     81                  05/09/2023                GFRNONAA                 >60                 05/26/2023              Anesthesia Other Findings   Reproductive/Obstetrics                             Anesthesia Physical Anesthesia Plan  ASA: 3  Anesthesia Plan: General   Post-op Pain Management:    Induction: Intravenous  PONV Risk Score and Plan: 4 or greater and Ondansetron, Dexamethasone and Treatment may vary due to age or medical condition  Airway Management Planned: Mask, Oral ETT and Video Laryngoscope Planned  Additional Equipment: None  Intra-op Plan:   Post-operative Plan: Extubation in OR  Informed Consent: I have reviewed the patients History and Physical, chart, labs and discussed the procedure including the risks, benefits and alternatives for the proposed anesthesia with the patient or authorized representative who has indicated his/her understanding and acceptance.     Dental advisory given  Plan Discussed with: CRNA  Anesthesia Plan Comments: (PAT note by Antionette Poles, PA-C: 82 year old female with a history of HTN, HLD, palpitations, one episode of global amnesia (2013), LBBB, and syncope  Echo Nov 2013 mild LVH,  LVEF 65 to 70% with grade 1dd. Carotid duplex negative. In 2018 EKG showed new  LBBB.  Myovue done which was normal. Previously had ILR placed due to episode of syncope, no events captured, battery is dead. Last seen by Dr. Tenny Craw April 23, 2023, stable at that time, HTN well controlled, no changes to management. 12 month followup recommended.   Other pertinent history includes postoperative nausea and vomiting, GERD, Raynaud's, hypothyroid, headaches.  Preop labs reviewed, mild chronic hyponatremia sodium 131, WBC mildly elevated at 13.9, otherwise unremarkable.  EKG 2023-04-23: NSR. Rate 71. LBBB.  14d event monitor 01/15/23: Rhythm:  SInus rhythm  Rates 51 to 133 bpm   Average HR 69 bpm Rare PACs,  rare PVCs 3 short bursts of SVT, fastest for 4 beats (133 bpm), longest for 5 beats.  Triggered events corresponded to Sinus rhythm  NO significant arrhythmias documented  Nuclear stress 10/29/16:  Nuclear stress EF: 68%.  There was no ST segment deviation noted during stress.  Defect 1: There is a small defect of moderate severity present in the apical septal location.   Low risk stress nuclear study with a fixed apicoseptal defect that appears most likely to be an artifact. Normal left ventricular regional and global systolic function.  )        Anesthesia Quick Evaluation

## 2023-05-26 NOTE — Progress Notes (Signed)
PCP - Dr. Youlanda Mighty.  Cardiologist - Dr. Dietrich Pates  PPM/ICD - denies  Pt does have loop recorder (not functioning)  Chest x-ray - denies EKG - 04/11/23 Stress Test - 10/29/2006 ECHO - 06/23/12 Cardiac Cath - denies  Sleep Study - denies   DM- denies  Blood Thinner Instructions: n/a Aspirin Instructions: Hold 1 week. Last dose 10/24  ERAS Protcol - no, NPO   COVID TEST- n/a   Anesthesia review: yes, Fayrene Fearing is reviewing chart. Cardiac hx  Patient denies shortness of breath, fever, cough and chest pain at PAT appointment   All instructions explained to the patient, with a verbal understanding of the material. Patient agrees to go over the instructions while at home for a better understanding.  The opportunity to ask questions was provided.

## 2023-05-27 ENCOUNTER — Inpatient Hospital Stay (HOSPITAL_COMMUNITY): Payer: Medicare Other | Admitting: Physician Assistant

## 2023-05-27 ENCOUNTER — Inpatient Hospital Stay (HOSPITAL_COMMUNITY): Payer: Medicare Other | Admitting: Anesthesiology

## 2023-05-27 ENCOUNTER — Inpatient Hospital Stay (HOSPITAL_COMMUNITY)
Admission: RE | Admit: 2023-05-27 | Discharge: 2023-05-28 | DRG: 430 | Disposition: A | Discharge status: Home / Self Care | Payer: Medicare Other | Attending: Neurological Surgery | Admitting: Neurological Surgery

## 2023-05-27 ENCOUNTER — Other Ambulatory Visit: Payer: Self-pay

## 2023-05-27 ENCOUNTER — Inpatient Hospital Stay (HOSPITAL_COMMUNITY): Payer: Medicare Other

## 2023-05-27 ENCOUNTER — Encounter (HOSPITAL_COMMUNITY)
Admission: RE | Disposition: A | Discharge status: Home / Self Care | Payer: Self-pay | Source: Home / Self Care | Attending: Neurological Surgery

## 2023-05-27 ENCOUNTER — Encounter (HOSPITAL_COMMUNITY): Payer: Self-pay | Admitting: Neurological Surgery

## 2023-05-27 DIAGNOSIS — M4802 Spinal stenosis, cervical region: Secondary | ICD-10-CM | POA: Diagnosis not present

## 2023-05-27 DIAGNOSIS — Z882 Allergy status to sulfonamides status: Secondary | ICD-10-CM

## 2023-05-27 DIAGNOSIS — I447 Left bundle-branch block, unspecified: Secondary | ICD-10-CM | POA: Diagnosis present

## 2023-05-27 DIAGNOSIS — Z981 Arthrodesis status: Secondary | ICD-10-CM | POA: Diagnosis not present

## 2023-05-27 DIAGNOSIS — Z823 Family history of stroke: Secondary | ICD-10-CM | POA: Diagnosis not present

## 2023-05-27 DIAGNOSIS — M4722 Other spondylosis with radiculopathy, cervical region: Secondary | ICD-10-CM

## 2023-05-27 DIAGNOSIS — Z87891 Personal history of nicotine dependence: Secondary | ICD-10-CM

## 2023-05-27 DIAGNOSIS — M4712 Other spondylosis with myelopathy, cervical region: Secondary | ICD-10-CM | POA: Diagnosis present

## 2023-05-27 DIAGNOSIS — E039 Hypothyroidism, unspecified: Secondary | ICD-10-CM | POA: Diagnosis present

## 2023-05-27 DIAGNOSIS — Z85828 Personal history of other malignant neoplasm of skin: Secondary | ICD-10-CM

## 2023-05-27 DIAGNOSIS — I7300 Raynaud's syndrome without gangrene: Secondary | ICD-10-CM | POA: Diagnosis present

## 2023-05-27 DIAGNOSIS — Z885 Allergy status to narcotic agent status: Secondary | ICD-10-CM

## 2023-05-27 DIAGNOSIS — F419 Anxiety disorder, unspecified: Secondary | ICD-10-CM | POA: Diagnosis present

## 2023-05-27 DIAGNOSIS — Z800 Family history of malignant neoplasm of digestive organs: Secondary | ICD-10-CM

## 2023-05-27 DIAGNOSIS — I10 Essential (primary) hypertension: Secondary | ICD-10-CM | POA: Diagnosis present

## 2023-05-27 DIAGNOSIS — Z90710 Acquired absence of both cervix and uterus: Secondary | ICD-10-CM | POA: Diagnosis not present

## 2023-05-27 DIAGNOSIS — E785 Hyperlipidemia, unspecified: Secondary | ICD-10-CM | POA: Diagnosis present

## 2023-05-27 DIAGNOSIS — K219 Gastro-esophageal reflux disease without esophagitis: Secondary | ICD-10-CM | POA: Diagnosis present

## 2023-05-27 DIAGNOSIS — E871 Hypo-osmolality and hyponatremia: Secondary | ICD-10-CM | POA: Diagnosis present

## 2023-05-27 HISTORY — PX: ANTERIOR CERVICAL DECOMP/DISCECTOMY FUSION: SHX1161

## 2023-05-27 SURGERY — ANTERIOR CERVICAL DECOMPRESSION/DISCECTOMY FUSION 3 LEVELS
Anesthesia: General

## 2023-05-27 MED ORDER — LIDOCAINE-EPINEPHRINE 1 %-1:100000 IJ SOLN
INTRAMUSCULAR | Status: DC | PRN
  Administered 2023-05-27: 4.5 mL

## 2023-05-27 MED ORDER — DOCUSATE SODIUM 100 MG PO CAPS
100.0000 mg | ORAL_CAPSULE | Freq: Two times a day (BID) | ORAL | Status: DC
  Administered 2023-05-27: 100 mg via ORAL
  Filled 2023-05-27: qty 1

## 2023-05-27 MED ORDER — FLUTICASONE PROPIONATE 50 MCG/ACT NA SUSP: 1.0000 | Freq: Every day | NASAL | Status: DC | PRN

## 2023-05-27 MED ORDER — LORAZEPAM 0.5 MG PO TABS: 0.5000 mg | ORAL_TABLET | Freq: Every day | ORAL | Status: DC | PRN

## 2023-05-27 MED ORDER — LIDOCAINE 2% (20 MG/ML) 5 ML SYRINGE
INTRAMUSCULAR | Status: DC | PRN
  Administered 2023-05-27: 40 mg via INTRAVENOUS

## 2023-05-27 MED ORDER — ACETAMINOPHEN 500 MG PO TABS
1000.0000 mg | ORAL_TABLET | Freq: Once | ORAL | Status: AC
  Administered 2023-05-27: 1000 mg via ORAL
  Filled 2023-05-27: qty 2

## 2023-05-27 MED ORDER — METHOCARBAMOL 500 MG PO TABS
500.0000 mg | ORAL_TABLET | Freq: Four times a day (QID) | ORAL | Status: DC | PRN
  Administered 2023-05-27 – 2023-05-28 (×2): 500 mg via ORAL
  Filled 2023-05-27 (×2): qty 1

## 2023-05-27 MED ORDER — ONDANSETRON HCL 4 MG PO TABS: 4.0000 mg | ORAL_TABLET | Freq: Four times a day (QID) | ORAL | Status: DC | PRN

## 2023-05-27 MED ORDER — PANTOPRAZOLE SODIUM 40 MG PO TBEC: 40.0000 mg | DELAYED_RELEASE_TABLET | Freq: Every day | ORAL | Status: DC

## 2023-05-27 MED ORDER — METHOCARBAMOL 1000 MG/10ML IJ SOLN: 500.0000 mg | Freq: Four times a day (QID) | INTRAMUSCULAR | Status: DC | PRN

## 2023-05-27 MED ORDER — BUPIVACAINE HCL (PF) 0.5 % IJ SOLN
INTRAMUSCULAR | Status: DC | PRN
  Administered 2023-05-27: 4.5 mL

## 2023-05-27 MED ORDER — LACTATED RINGERS IV SOLN: INTRAVENOUS | Status: DC

## 2023-05-27 MED ORDER — AMITRIPTYLINE HCL 50 MG PO TABS
25.0000 mg | ORAL_TABLET | Freq: Every day | ORAL | Status: DC
  Administered 2023-05-27: 25 mg via ORAL
  Filled 2023-05-27: qty 1

## 2023-05-27 MED ORDER — PROPOFOL 10 MG/ML IV BOLUS
INTRAVENOUS | Status: DC | PRN
  Administered 2023-05-27: 80 mg via INTRAVENOUS

## 2023-05-27 MED ORDER — ONDANSETRON HCL 4 MG/2ML IJ SOLN
INTRAMUSCULAR | Status: DC | PRN
  Administered 2023-05-27: 4 mg via INTRAVENOUS

## 2023-05-27 MED ORDER — BUPIVACAINE HCL (PF) 0.5 % IJ SOLN
INTRAMUSCULAR | Status: AC
  Filled 2023-05-27: qty 30

## 2023-05-27 MED ORDER — FENTANYL CITRATE (PF) 100 MCG/2ML IJ SOLN
25.0000 ug | INTRAMUSCULAR | Status: DC | PRN
  Administered 2023-05-27: 50 ug via INTRAVENOUS

## 2023-05-27 MED ORDER — CEFAZOLIN SODIUM-DEXTROSE 2-4 GM/100ML-% IV SOLN
2.0000 g | Freq: Three times a day (TID) | INTRAVENOUS | Status: AC
Start: 2023-05-27 — End: 2023-05-28
  Administered 2023-05-27 – 2023-05-28 (×2): 2 g via INTRAVENOUS
  Filled 2023-05-27 (×2): qty 100

## 2023-05-27 MED ORDER — THROMBIN 5000 UNITS EX SOLR
OROMUCOSAL | Status: DC | PRN
  Administered 2023-05-27: 5 mL via TOPICAL

## 2023-05-27 MED ORDER — PROPOFOL 500 MG/50ML IV EMUL
INTRAVENOUS | Status: DC | PRN
  Administered 2023-05-27: 125 ug/kg/min via INTRAVENOUS

## 2023-05-27 MED ORDER — ONDANSETRON HCL 4 MG/2ML IJ SOLN
INTRAMUSCULAR | Status: AC
  Filled 2023-05-27: qty 2

## 2023-05-27 MED ORDER — IRBESARTAN 150 MG PO TABS
300.0000 mg | ORAL_TABLET | Freq: Every day | ORAL | Status: DC
  Administered 2023-05-27 – 2023-05-28 (×2): 300 mg via ORAL
  Filled 2023-05-27 (×2): qty 2

## 2023-05-27 MED ORDER — POLYETHYLENE GLYCOL 3350 17 G PO PACK: 17.0000 g | PACK | Freq: Every day | ORAL | Status: DC | PRN

## 2023-05-27 MED ORDER — CEFAZOLIN SODIUM-DEXTROSE 2-4 GM/100ML-% IV SOLN
INTRAVENOUS | Status: AC
  Filled 2023-05-27: qty 100

## 2023-05-27 MED ORDER — ACETAMINOPHEN 650 MG RE SUPP: 650.0000 mg | RECTAL | Status: DC | PRN

## 2023-05-27 MED ORDER — ORAL CARE MOUTH RINSE: 15.0000 mL | Freq: Once | OROMUCOSAL | Status: AC

## 2023-05-27 MED ORDER — LIDOCAINE-EPINEPHRINE 1 %-1:100000 IJ SOLN
INTRAMUSCULAR | Status: AC
  Filled 2023-05-27: qty 1

## 2023-05-27 MED ORDER — SODIUM CHLORIDE 0.9 % IV SOLN: 250.0000 mL | INTRAVENOUS | Status: DC

## 2023-05-27 MED ORDER — FLEET ENEMA RE ENEM: 1.0000 | ENEMA | Freq: Once | RECTAL | Status: DC | PRN

## 2023-05-27 MED ORDER — DROPERIDOL 2.5 MG/ML IJ SOLN: 0.6250 mg | Freq: Once | INTRAMUSCULAR | Status: DC | PRN

## 2023-05-27 MED ORDER — CEFAZOLIN SODIUM-DEXTROSE 2-4 GM/100ML-% IV SOLN
2.0000 g | INTRAVENOUS | Status: AC
  Administered 2023-05-27: 2 g via INTRAVENOUS

## 2023-05-27 MED ORDER — LABETALOL HCL 5 MG/ML IV SOLN
INTRAVENOUS | Status: AC
  Filled 2023-05-27: qty 4

## 2023-05-27 MED ORDER — LEVOTHYROXINE SODIUM 25 MCG PO TABS
50.0000 ug | ORAL_TABLET | Freq: Every day | ORAL | Status: DC
Start: 2023-05-28 — End: 2023-05-28
  Administered 2023-05-28: 50 ug via ORAL
  Filled 2023-05-27: qty 2

## 2023-05-27 MED ORDER — CHLORHEXIDINE GLUCONATE 0.12 % MT SOLN
OROMUCOSAL | Status: AC
  Administered 2023-05-27: 15 mL via OROMUCOSAL
  Filled 2023-05-27: qty 15

## 2023-05-27 MED ORDER — SODIUM CHLORIDE 0.9% FLUSH: 3.0000 mL | INTRAVENOUS | Status: DC | PRN

## 2023-05-27 MED ORDER — LABETALOL HCL 5 MG/ML IV SOLN
INTRAVENOUS | Status: DC | PRN
  Administered 2023-05-27: 2.5 mg via INTRAVENOUS
  Administered 2023-05-27: 5 mg via INTRAVENOUS
  Administered 2023-05-27: 2.5 mg via INTRAVENOUS

## 2023-05-27 MED ORDER — DEXAMETHASONE SODIUM PHOSPHATE 10 MG/ML IJ SOLN
INTRAMUSCULAR | Status: AC
  Filled 2023-05-27: qty 1

## 2023-05-27 MED ORDER — ROSUVASTATIN CALCIUM 20 MG PO TABS
20.0000 mg | ORAL_TABLET | Freq: Every day | ORAL | Status: DC
  Administered 2023-05-27: 20 mg via ORAL
  Filled 2023-05-27: qty 1

## 2023-05-27 MED ORDER — RISAQUAD PO CAPS
1.0000 | ORAL_CAPSULE | Freq: Every day | ORAL | Status: DC
  Administered 2023-05-27: 1 via ORAL
  Filled 2023-05-27: qty 1

## 2023-05-27 MED ORDER — HYDROMORPHONE HCL 1 MG/ML IJ SOLN: 0.5000 mg | INTRAMUSCULAR | Status: DC | PRN

## 2023-05-27 MED ORDER — SUGAMMADEX SODIUM 200 MG/2ML IV SOLN
INTRAVENOUS | Status: DC | PRN
  Administered 2023-05-27: 121.2 mg via INTRAVENOUS
  Administered 2023-05-27: 100 mg via INTRAVENOUS

## 2023-05-27 MED ORDER — MENTHOL 3 MG MT LOZG
1.0000 | LOZENGE | OROMUCOSAL | Status: DC | PRN
  Filled 2023-05-27: qty 9

## 2023-05-27 MED ORDER — OXYCODONE-ACETAMINOPHEN 5-325 MG PO TABS
1.0000 | ORAL_TABLET | ORAL | Status: DC | PRN
  Administered 2023-05-27 – 2023-05-28 (×3): 1 via ORAL
  Filled 2023-05-27 (×3): qty 1

## 2023-05-27 MED ORDER — FENTANYL CITRATE (PF) 250 MCG/5ML IJ SOLN
INTRAMUSCULAR | Status: DC | PRN
  Administered 2023-05-27 (×3): 50 ug via INTRAVENOUS

## 2023-05-27 MED ORDER — ALBUTEROL SULFATE (2.5 MG/3ML) 0.083% IN NEBU: 3.0000 mL | INHALATION_SOLUTION | Freq: Four times a day (QID) | RESPIRATORY_TRACT | Status: DC | PRN

## 2023-05-27 MED ORDER — ALUM & MAG HYDROXIDE-SIMETH 200-200-20 MG/5ML PO SUSP
30.0000 mL | Freq: Four times a day (QID) | ORAL | Status: DC | PRN
Start: 2023-05-27 — End: 2023-05-28

## 2023-05-27 MED ORDER — CHLORHEXIDINE GLUCONATE CLOTH 2 % EX PADS: 6.0000 | MEDICATED_PAD | Freq: Once | CUTANEOUS | Status: DC

## 2023-05-27 MED ORDER — LIDOCAINE 2% (20 MG/ML) 5 ML SYRINGE
INTRAMUSCULAR | Status: AC
  Filled 2023-05-27: qty 5

## 2023-05-27 MED ORDER — DEXAMETHASONE SODIUM PHOSPHATE 10 MG/ML IJ SOLN
INTRAMUSCULAR | Status: DC | PRN
  Administered 2023-05-27: 10 mg via INTRAVENOUS

## 2023-05-27 MED ORDER — 0.9 % SODIUM CHLORIDE (POUR BTL) OPTIME
TOPICAL | Status: DC | PRN
  Administered 2023-05-27: 1000 mL

## 2023-05-27 MED ORDER — ROCURONIUM BROMIDE 10 MG/ML (PF) SYRINGE
PREFILLED_SYRINGE | INTRAVENOUS | Status: AC
  Filled 2023-05-27: qty 10

## 2023-05-27 MED ORDER — PHENOL 1.4 % MT LIQD: 1.0000 | OROMUCOSAL | Status: DC | PRN

## 2023-05-27 MED ORDER — BISACODYL 10 MG RE SUPP: 10.0000 mg | Freq: Every day | RECTAL | Status: DC | PRN

## 2023-05-27 MED ORDER — SODIUM CHLORIDE 0.9% FLUSH
3.0000 mL | Freq: Two times a day (BID) | INTRAVENOUS | Status: DC
  Administered 2023-05-27: 3 mL via INTRAVENOUS

## 2023-05-27 MED ORDER — THROMBIN 5000 UNITS EX SOLR
CUTANEOUS | Status: AC
  Filled 2023-05-27: qty 5000

## 2023-05-27 MED ORDER — FENTANYL CITRATE (PF) 100 MCG/2ML IJ SOLN
INTRAMUSCULAR | Status: AC
  Filled 2023-05-27: qty 2

## 2023-05-27 MED ORDER — CHLORHEXIDINE GLUCONATE 0.12 % MT SOLN
15.0000 mL | Freq: Once | OROMUCOSAL | Status: AC
Start: 2023-05-27 — End: 2023-05-27

## 2023-05-27 MED ORDER — ACETAMINOPHEN 325 MG PO TABS: 650.0000 mg | ORAL_TABLET | ORAL | Status: DC | PRN

## 2023-05-27 MED ORDER — ROCURONIUM BROMIDE 10 MG/ML (PF) SYRINGE
PREFILLED_SYRINGE | INTRAVENOUS | Status: DC | PRN
  Administered 2023-05-27: 50 mg via INTRAVENOUS
  Administered 2023-05-27: 20 mg via INTRAVENOUS

## 2023-05-27 MED ORDER — SENNA 8.6 MG PO TABS
1.0000 | ORAL_TABLET | Freq: Two times a day (BID) | ORAL | Status: DC
  Administered 2023-05-27: 8.6 mg via ORAL
  Filled 2023-05-27: qty 1

## 2023-05-27 MED ORDER — TRIAMCINOLONE ACETONIDE 55 MCG/ACT NA AERO: 1.0000 | INHALATION_SPRAY | Freq: Every day | NASAL | Status: DC | PRN

## 2023-05-27 MED ORDER — FENTANYL CITRATE (PF) 250 MCG/5ML IJ SOLN
INTRAMUSCULAR | Status: AC
  Filled 2023-05-27: qty 5

## 2023-05-27 MED ORDER — SODIUM CHLORIDE 0.9 % IV SOLN: INTRAVENOUS | Status: DC | PRN

## 2023-05-27 MED ORDER — ONDANSETRON HCL 4 MG/2ML IJ SOLN: 4.0000 mg | Freq: Four times a day (QID) | INTRAMUSCULAR | Status: DC | PRN

## 2023-05-27 SURGICAL SUPPLY — 55 items
ADH SKN CLS APL DERMABOND .7 (GAUZE/BANDAGES/DRESSINGS) ×1
BAG COUNTER SPONGE SURGICOUNT (BAG) ×1 IMPLANT
BAG SPNG CNTER NS LX DISP (BAG) ×1
BASKET BONE COLLECTION (BASKET) IMPLANT
BIT DRILL ACP 15 (DRILL) IMPLANT
BIT DRILL NEURO 2X3.1 SFT TUCH (MISCELLANEOUS) ×1 IMPLANT
BNDG GAUZE DERMACEA FLUFF 4 (GAUZE/BANDAGES/DRESSINGS) IMPLANT
BNDG GZE DERMACEA 4 6PLY (GAUZE/BANDAGES/DRESSINGS)
BUR BARREL STRAIGHT FLUTE 4.0 (BURR) IMPLANT
CAGE CERV MOD 5X15X12 7D (Cage) IMPLANT
CAGE CERV MOD 5X17X14 7D (Cage) IMPLANT
CAGE CERV MOD 6X15X12 7D (Cage) IMPLANT
CANISTER SUCT 3000ML PPV (MISCELLANEOUS) ×2 IMPLANT
DERMABOND ADVANCED .7 DNX12 (GAUZE/BANDAGES/DRESSINGS) ×1 IMPLANT
DRAPE LAPAROTOMY 100X72 PEDS (DRAPES) ×1 IMPLANT
DRAPE MICROSCOPE SLANT 54X150 (MISCELLANEOUS) IMPLANT
DRILL ACP 15 (DRILL) ×1
DRILL NEURO 2X3.1 SOFT TOUCH (MISCELLANEOUS) ×1
DURAPREP 6ML APPLICATOR 50/CS (WOUND CARE) ×1 IMPLANT
ELECT COATED BLADE 2.86 ST (ELECTRODE) ×1 IMPLANT
ELECT REM PT RETURN 9FT ADLT (ELECTROSURGICAL) ×1
ELECTRODE REM PT RTRN 9FT ADLT (ELECTROSURGICAL) ×1 IMPLANT
GAUZE 4X4 16PLY ~~LOC~~+RFID DBL (SPONGE) IMPLANT
GLOVE BIOGEL PI IND STRL 8.5 (GLOVE) ×1 IMPLANT
GLOVE ECLIPSE 8.5 STRL (GLOVE) ×2 IMPLANT
GLOVE EXAM NITRILE XL STR (GLOVE) IMPLANT
GOWN STRL REUS W/ TWL LRG LVL3 (GOWN DISPOSABLE) IMPLANT
GOWN STRL REUS W/ TWL XL LVL3 (GOWN DISPOSABLE) ×1 IMPLANT
GOWN STRL REUS W/TWL 2XL LVL3 (GOWN DISPOSABLE) ×2 IMPLANT
GOWN STRL REUS W/TWL LRG LVL3 (GOWN DISPOSABLE)
GOWN STRL REUS W/TWL XL LVL3 (GOWN DISPOSABLE) ×1
HALTER HD/CHIN CERV TRACTION D (MISCELLANEOUS) ×1 IMPLANT
HEMOSTAT POWDER KIT SURGIFOAM (HEMOSTASIS) ×1 IMPLANT
KIT BASIN OR (CUSTOM PROCEDURE TRAY) ×1 IMPLANT
KIT TURNOVER KIT B (KITS) ×1 IMPLANT
NDL HYPO 22X1.5 SAFETY MO (MISCELLANEOUS) ×2 IMPLANT
NDL SPNL 22GX3.5 QUINCKE BK (NEEDLE) ×1 IMPLANT
NEEDLE HYPO 22X1.5 SAFETY MO (MISCELLANEOUS) ×1 IMPLANT
NEEDLE SPNL 22GX3.5 QUINCKE BK (NEEDLE) ×1 IMPLANT
NS IRRIG 1000ML POUR BTL (IV SOLUTION) ×1 IMPLANT
PACK LAMINECTOMY NEURO (CUSTOM PROCEDURE TRAY) ×1 IMPLANT
PAD ARMBOARD 7.5X6 YLW CONV (MISCELLANEOUS) ×6 IMPLANT
PATTIES SURGICAL .5 X1 (DISPOSABLE) ×2 IMPLANT
PIN DISTRACTION 14MM (PIN) IMPLANT
PLATE ACP 1.9X56 3LVL (Screw) IMPLANT
PUTTY DBM PROPEL MEDIUM (Putty) IMPLANT
SCREW ACP VA ST 3.5X15 (Screw) IMPLANT
SET WALTER ACTIVATION W/DRAPE (SET/KITS/TRAYS/PACK) ×1 IMPLANT
SPIKE FLUID TRANSFER (MISCELLANEOUS) ×2 IMPLANT
SPONGE INTESTINAL PEANUT (DISPOSABLE) ×1 IMPLANT
SUT VIC AB 4-0 RB1 18 (SUTURE) ×2 IMPLANT
TOWEL GREEN STERILE (TOWEL DISPOSABLE) ×2 IMPLANT
TOWEL GREEN STERILE FF (TOWEL DISPOSABLE) ×1 IMPLANT
TUBING FEATHERFLOW (TUBING) ×2 IMPLANT
WATER STERILE IRR 1000ML POUR (IV SOLUTION) ×2 IMPLANT

## 2023-05-27 NOTE — H&P (Signed)
Sabrina Parks is an 82 y.o. female.   Chief Complaint: Shoulder and arm pain HPI: Patient is an 14-year-old individual whose had significant neck shoulder and arm pain with radiation to both hands numbness and tingling are noted he has neck pain with turning to the left and to the right even small degrees she has failed efforts at conservative management and the discomfort is such that I have advised surgical decompression arthrodesis at C4-5 C5-6 and C6-C7 where she has some cord compression and marked biforaminal stenosis.  Past Medical History:  Diagnosis Date   Alopecia    Anxiety    Arthritis    Cancer (HCC)    skin cancers removed   Complication of anesthesia    DJD (degenerative joint disease), cervical 11/2000   Sabrina Parks   GERD (gastroesophageal reflux disease)    Headache    HSV-1 infection    Hyperlipidemia    Hypertension    Hypothyroidism    LBBB (left bundle branch block)    Lichen sclerosus 05/22/2016   biopsy proven   Liver hemangioma 2011   [name]   PONV (postoperative nausea and vomiting)    Raynaud's disease    Thyroiditis    Transient global amnesia    not fully diag    Past Surgical History:  Procedure Laterality Date   APPENDECTOMY  1956   CARDIOVASCULAR STRESS TEST  10/29/2006   EF 78%.    CATARACT EXTRACTION Bilateral    CHOLECYSTECTOMY     CYSTOCELE REPAIR N/A 12/29/2018   Procedure: exam under anesthesia;  Surgeon: [name], MD;  Location: [facility] ORS;  Service: Urology;  Laterality: N/A;   LOOP RECORDER INSERTION N/A 05/01/2017   Procedure: LOOP RECORDER INSERTION;  Surgeon: [name], MD;  Location: [facility] INVASIVE CV LAB;  Service: Cardiovascular;  Laterality: N/A;   LUMBAR LAMINECTOMY/DECOMPRESSION MICRODISCECTOMY N/A 09/25/2020   Procedure: Lumbar two-three Lumbar three-four Lumbar four-five  Laminectomy and foraminotomy;  Surgeon: [name], MD;  Location: [facility] OR;  Service: Neurosurgery;  Laterality: N/A;   ROBOTIC ASSISTED  LAPAROSCOPIC SACROCOLPOPEXY N/A 02/25/2019   Procedure: XI ROBOTIC ASSISTED LAPAROSCOPIC SACROCOLPOPEXY;  Surgeon: [name], MD;  Location: [facility] ORS;  Service: Urology;  Laterality: N/A;   ROTATOR CUFF REPAIR Bilateral    SHOULDER SURGERY     TONSILLECTOMY AND ADENOIDECTOMY  1957   TOTAL ABDOMINAL HYSTERECTOMY W/ BILATERAL SALPINGOOPHORECTOMY  01/1989   US ECHOCARDIOGRAPHY  10/11/2009   EF 55-60%    Family History  Problem Relation Age of Onset   Stroke Mother    Cancer Father    Colon cancer Father        38's   Social History:  reports that she quit smoking about 53 years ago. Her smoking use included cigarettes. She started smoking about 63 years ago. She has never used smokeless tobacco. She reports current alcohol use of about 3.0 standard drinks of alcohol per week. She reports that she does not use drugs.  Allergies:  Allergies  Allergen Reactions   Atorvastatin Other (See Comments)    Muscle pain   Codeine Nausea Only   Morphine Hives    Other reaction(s): Hives/Skin Rash, makes her crazy   Morphine And Codeine Hives   Sulfa Antibiotics Rash    No medications prior to admission.    Results for orders placed or performed during the hospital encounter of 05/26/23 (from the past 48 hour(s))  Surgical pcr screen     Status: None   Collection Time: 05/26/23  2:35 PM   Specimen: Nasal Mucosa; Nasal Swab  Result Value Ref Range   MRSA, PCR NEGATIVE NEGATIVE   Staphylococcus aureus NEGATIVE NEGATIVE    Comment: (NOTE) The Xpert SA Assay (FDA approved for NASAL specimens in patients 74 years of age and older), is one component of a comprehensive surveillance program. It is not intended to diagnose infection nor to guide or monitor treatment. Performed at [facility], [address], [place], [place] [address]   Type and screen [facility]     Status: None   Collection Time: 05/26/23  2:50 PM  Result Value Ref Range   ABO/RH(D) B POS     Antibody Screen NEG    Sample Expiration 06/09/2023,2359    Extend sample reason      NO TRANSFUSIONS OR PREGNANCY IN THE PAST 3 MONTHS Performed at [facility], [address] [place], [place] [address]   Comprehensive metabolic panel per protocol     Status: Abnormal   Collection Time: 05/26/23  3:00 PM  Result Value Ref Range   Sodium 131 (L) 135 - 145 mmol/L   Potassium 4.0 3.5 - 5.1 mmol/L   Chloride 101 98 - 111 mmol/L   CO2 21 (L) 22 - 32 mmol/L   Glucose, Bld 166 (H) 70 - 99 mg/dL    Comment: Glucose reference range applies only to samples taken after fasting for at least 8 hours.   BUN 13 8 - 23 mg/dL   Creatinine, Ser 4.78 0.44 - 1.00 mg/dL   Calcium 9.5 8.9 - 29.5 mg/dL   Total Protein 6.9 6.5 - 8.1 g/dL   Albumin 3.9 3.5 - 5.0 g/dL   AST 28 15 - 41 U/L   ALT 45 (H) 0 - 44 U/L   Alkaline Phosphatase 108 38 - 126 U/L   Total Bilirubin 0.8 0.3 - 1.2 mg/dL   GFR, Estimated >62 >13 mL/min    Comment: (NOTE) Calculated using the CKD-EPI Creatinine Equation (2021)    Anion gap 9 5 - 15    Comment: Performed at [facility], [address] [place], [place] [address]  CBC per protocol     Status: Abnormal   Collection Time: 05/26/23  3:00 PM  Result Value Ref Range   WBC 13.9 (H) 4.0 - 10.5 K/uL   RBC 4.50 3.87 - 5.11 MIL/uL   Hemoglobin 13.0 12.0 - 15.0 g/dL   HCT 84.6 96.2 - 95.2 %   MCV 89.6 80.0 - 100.0 fL   MCH 28.9 26.0 - 34.0 pg   MCHC 32.3 30.0 - 36.0 g/dL   RDW 84.1 32.4 - 40.1 %   Platelets 377 150 - 400 K/uL   nRBC 0.0 0.0 - 0.2 %    Comment: Performed at [facility], [address], [place], [place] [address]   No results found.  Review of Systems  Musculoskeletal:  Positive for myalgias and neck pain.  Neurological:  Positive for numbness.  All other systems reviewed and are negative.   Last menstrual period 07/29/1990. Physical Exam Constitutional:      Appearance: Normal appearance.  HENT:     Head: Normocephalic and  atraumatic.     Right Ear: Tympanic membrane, ear canal and external ear normal.     Left Ear: Tympanic membrane, ear canal and external ear normal.     Nose: Nose normal.     Mouth/Throat:     Mouth: Mucous membranes  are moist.     Pharynx: Oropharynx is clear.  Eyes:     Extraocular Movements: Extraocular movements intact.     Conjunctiva/sclera: Conjunctivae normal.  Neck:     Comments: Range of motion turning 15 degrees to the right 15 degrees to the left flexing and extending less than 30% Cardiovascular:     Rate and Rhythm: Normal rate and regular rhythm.     Pulses: Normal pulses.     Heart sounds: Normal heart sounds.  Pulmonary:     Effort: Pulmonary effort is normal.     Breath sounds: Normal breath sounds.  Abdominal:     General: Abdomen is flat.     Palpations: Abdomen is soft.  Musculoskeletal:        General: Normal range of motion.  Skin:    General: Skin is warm and dry.     Capillary Refill: Capillary refill takes less than 2 seconds.  Neurological:     Mental Status: She is alert.     Comments: Mild weakness in the grip of 4 out of 5 bilaterally strength in the triceps is decreased to 4 out of 5.  Sensation is intact in the upper extremities.  Psychiatric:        Mood and Affect: Mood normal.        Behavior: Behavior normal.        Thought Content: Thought content normal.        Judgment: Judgment normal.      Assessment/Plan Cervical spondylosis with myelopathy and radiculopathy C4-5 C5-6 and C6-C7.  Plan: Anterior cervical decompression arthrodesis C4-5 C5-6 and C6-C7.  [name], MD 05/27/2023, 7:43 AM

## 2023-05-27 NOTE — Anesthesia Procedure Notes (Signed)
Procedure Name: Intubation Date/Time: 05/27/2023 12:18 PM  Performed by: Randon Goldsmith, CRNAPre-anesthesia Checklist: Patient identified, Emergency Drugs available, Suction available and Patient being monitored Patient Re-evaluated:Patient Re-evaluated prior to induction Oxygen Delivery Method: Circle system utilized Preoxygenation: Pre-oxygenation with 100% oxygen Induction Type: IV induction Ventilation: Mask ventilation without difficulty Laryngoscope Size: Glidescope and 3 Grade View: Grade I Tube type: Oral Tube size: 7.0 mm Number of attempts: 1 Airway Equipment and Method: Stylet and Oral airway Placement Confirmation: ETT inserted through vocal cords under direct vision, positive ETCO2 and breath sounds checked- equal and bilateral Secured at: 20 cm Tube secured with: Tape Dental Injury: Teeth and Oropharynx as per pre-operative assessment

## 2023-05-27 NOTE — Anesthesia Postprocedure Evaluation (Signed)
Anesthesia Post Note  Patient: Sabrina Parks  Procedure(s) Performed: ANTERIOR CERVICAL DECOMPRESSION/DISCECTOMY FUSION 3 LEVELS CERVICAL FOUR-CERVICAL FIVE - CERVICAL FIVE-CERIVCAL SIX -CERVICAL SIX-CERVICAL SEVEN     Patient location during evaluation: PACU Anesthesia Type: General Level of consciousness: awake and alert Pain management: pain level controlled Vital Signs Assessment: post-procedure vital signs reviewed and stable Respiratory status: spontaneous breathing, nonlabored ventilation, respiratory function stable and patient connected to nasal cannula oxygen Cardiovascular status: blood pressure returned to baseline and stable Postop Assessment: no apparent nausea or vomiting Anesthetic complications: no   No notable events documented.  Last Vitals:  Vitals:   05/27/23 1550 05/27/23 1608  BP: (!) 144/64 (!) 143/61  Pulse: 61 63  Resp: 16 16  Temp: 36.4 C   SpO2: 98% 97%    Last Pain:  Vitals:   05/27/23 1800  TempSrc:   PainSc: 4                  [name]

## 2023-05-27 NOTE — Transfer of Care (Signed)
Immediate Anesthesia Transfer of Care Note  Patient: Sabrina Parks  Procedure(s) Performed: ANTERIOR CERVICAL DECOMPRESSION/DISCECTOMY FUSION 3 LEVELS CERVICAL FOUR-CERVICAL FIVE - CERVICAL FIVE-CERIVCAL SIX -CERVICAL SIX-CERVICAL SEVEN  Patient Location: PACU  Anesthesia Type:General  Level of Consciousness: oriented and drowsy  Airway & Oxygen Therapy: Patient Spontanous Breathing  Post-op Assessment: Report given to RN and Post -op Vital signs reviewed and stable  Post vital signs: Reviewed and stable  Last Vitals:  Vitals Value Taken Time  BP 139/63 05/27/23 1509  Temp    Pulse 65 05/27/23 1510  Resp 18 05/27/23 1510  SpO2 97 % 05/27/23 1510  Vitals shown include unfiled device data.  Last Pain:  Vitals:   05/27/23 1013  TempSrc:   PainSc: 0-No pain         Complications: No notable events documented.

## 2023-05-27 NOTE — Interval H&P Note (Signed)
History and Physical Interval Note:  05/27/2023 11:53 AM  Sabrina Parks  has presented today for surgery, with the diagnosis of Other spondylosis with myelopath, cervical region.  The various methods of treatment have been discussed with the patient and family. After consideration of risks, benefits and other options for treatment, the patient has consented to  Procedure(s) with comments: ANTERIOR CERVICAL DECOMPRESSION/DISCECTOMY FUSION 3 LEVELS C4-C5 - C5-C6 -C6-C7 (N/A) - 3C as a surgical intervention.  The patient's history has been reviewed, patient examined, no change in status, stable for surgery.  I have reviewed the patient's chart and labs.  Questions were answered to the patient's satisfaction.     [name]

## 2023-05-27 NOTE — Op Note (Signed)
Date of surgery: 05/27/2023 Preoperative diagnosis: Cervical spondylosis with radiculopathy and myelopathy C4-5 C5-6 and C6-C7 postoperative diagnosis same procedure is anterior cervical decompression C4-5 C5-6 and C6-C7.  Arthrodesis with titanium plate and autograft mixed with allograft morselized.  Anterior plate fixation G2-X5. Surgeon: [name] Anesthesia: General endotracheal Indications: Sabrina Parks is an 82 year old individuals had significant neck shoulder and arm pain.  She has failed efforts at conservative management and though the pain is improved and has worsened it has not completely gone away.  She was evaluated for surgery and I advised a three-level anterior decompression arthrodesis C4-C7.  Procedure: Patient was brought to the operating room supine on the stretcher.  After the smooth induction of general endotracheal anesthesia she was placed in 5 pounds of halter traction.  The neck was prepped with alcohol DuraPrep and draped in a sterile fashion.  An incision was made on the left side of the neck and carried down through the platysma.  The plane between the sternocleidomastoid and the strap muscles was dissected bluntly until the prevertebral space was reached.  First identifiable disc space was noted to be that of C5-C6.  Then by removing ventral osteophytes the disc space could be entered.  The disc was quite degenerated and desiccated.  The opening in the disc base was enlarged a self-retaining discredit could be placed and then other osteophytic material along with degenerated disc was removed until a complete discectomy was performed.  The posterior longitudinal ligament was opened and the epidural space was then explored carefully to decompress the path of the nerve roots into the either foramen left and right.  Significant osteophytes were drilled then this bone was saved for use and grafting this bone was mixed with demineralized bone matrix and then placed into a spacer  which at C5-C6 measured 6 mm in height and was a small spacer measuring 6 x 15 x 12 mm in size.  At C4-5 a similar process was carried out here the space was quite tight and here only a 5 mm spacer measuring 15 x 12 mm with 7 degrees of lordosis could be fitted into the interspace at C6-C7 the space was large and flat however very narrow and a 5 x 17 x 14 mm spacer with 7 degrees lordosis was filled with autograft allograft and placed into the interspace.  The lateral gutters along either side of the titanium plates were then filled with the remainder of autograft allograft mixture.  At this point the prevertebral spaces were examined and it was felt that a 56 mm long standard ACP plate would fit best over this interval and this was secured with 8 locking 15 mm screws.  Once the screws were secured a final radiograph was obtained in AP and lateral projection which identified good position of the plate.  Prevertebral space was then checked for hemostasis and when this was verified the retractors were removed the platysma was reapproximated with 4-0 Vicryl in interrupted fashion and 4-0 Vicryl suture was used to close the subcuticular skin.  Dermabond was placed on the skin which patient then tolerated very well.  Blood loss for the entire procedure was estimated at some 75 cc.

## 2023-05-28 ENCOUNTER — Encounter (HOSPITAL_COMMUNITY): Payer: Self-pay | Admitting: Neurological Surgery

## 2023-05-28 ENCOUNTER — Telehealth (HOSPITAL_COMMUNITY): Payer: Self-pay | Admitting: Pharmacy Technician

## 2023-05-28 ENCOUNTER — Other Ambulatory Visit (HOSPITAL_COMMUNITY): Payer: Self-pay

## 2023-05-28 MED ORDER — OXYCODONE-ACETAMINOPHEN 5-325 MG PO TABS
1.0000 | ORAL_TABLET | ORAL | 0 refills | Status: DC | PRN
  Filled 2023-05-28: qty 30, 5d supply, fill #0

## 2023-05-28 MED ORDER — DEXAMETHASONE 2 MG PO TABS
ORAL_TABLET | ORAL | 0 refills | Status: AC
  Filled 2023-05-28: qty 7, 6d supply, fill #0

## 2023-05-28 MED ORDER — DIAZEPAM 5 MG PO TABS
5.0000 mg | ORAL_TABLET | Freq: Four times a day (QID) | ORAL | 0 refills | Status: DC | PRN
  Filled 2023-05-28: qty 30, 8d supply, fill #0

## 2023-05-28 NOTE — Telephone Encounter (Signed)
Pharmacy Patient Advocate Encounter  Received notification from [facility] that Prior Authorization for diazePAM 5MG  tablets has been APPROVED from 05/28/2023 to 08/26/2023   PA #/Case ID/Reference #: [id number] Key: [id number]

## 2023-05-28 NOTE — Plan of Care (Signed)
  Problem: Education: Goal: Ability to verbalize activity precautions or restrictions will improve Outcome: Completed/Met Goal: Knowledge of the prescribed therapeutic regimen will improve Outcome: Completed/Met Goal: Understanding of discharge needs will improve Outcome: Completed/Met   Problem: Activity: Goal: Ability to avoid complications of mobility impairment will improve Outcome: Completed/Met Goal: Ability to tolerate increased activity will improve Outcome: Completed/Met Goal: Will remain free from falls Outcome: Completed/Met   Problem: Bowel/Gastric: Goal: Gastrointestinal status for postoperative course will improve Outcome: Completed/Met   Problem: Clinical Measurements: Goal: Ability to maintain clinical measurements within normal limits will improve Outcome: Completed/Met Goal: Postoperative complications will be avoided or minimized Outcome: Completed/Met Goal: Diagnostic test results will improve Outcome: Completed/Met   Problem: Pain Management: Goal: Pain level will decrease Outcome: Completed/Met   Problem: Skin Integrity: Goal: Will show signs of wound healing Outcome: Completed/Met

## 2023-05-28 NOTE — Progress Notes (Signed)
Patient alert and oriented, void, ambulate. Surgical site clean and dry no sign of infection. D/c instructions explain and given all questions answered.

## 2023-05-28 NOTE — Progress Notes (Signed)
PT Cancellation Note  Patient Details Name: Mariela Kuzia MRN: [id number] DOB: 1941/01/19   Cancelled Treatment:    Reason Eval/Treat Not Completed: PT screened, no needs identified, will sign off. Per OT, pt indep and doesn't require any AD. Pt with no current acute PT needs at this time. Please reconsult if needed in future.  Lewis Shock, PT, DPT Acute Rehabilitation Services Secure chat preferred Office #: [phone]    Iona Hansen 05/28/2023, 9:54 AM

## 2023-05-28 NOTE — Discharge Summary (Signed)
Physician Discharge Summary  Patient ID: Sabrina Parks MRN: [id number] DOB/AGE: 12/13/1940 82 y.o.  Admit date: 05/27/2023 Discharge date: 05/28/2023  Admission Diagnoses: Cervical spondylosis with myelopathy and radiculopathy  Discharge Diagnoses: Cervical spondylosis with myelopathy and radiculopathy C4-5 C5-6 and C6-C7 Principal Problem:   Cervical spondylosis with radiculopathy   Discharged Condition: good  Hospital Course: Patient was admitted to undergo surgical decompression which she tolerated well.  Consults: None  Significant Diagnostic Studies: None  Treatments: surgery: See op note  Discharge Exam: Blood pressure (!) 143/60, pulse 67, temperature 97.9 F (36.6 C), temperature source Oral, resp. rate 16, height 5' 3.5" (1.613 m), weight 60.6 kg, last menstrual period 07/29/1990, SpO2 96%. Incisions clean and dry motor function is intact swallowing is no problem  Disposition: Discharge disposition: 01-Home or Self Care       Discharge Instructions     Call MD for:  redness, tenderness, or signs of infection (pain, swelling, redness, odor or green/yellow discharge around incision site)   Complete by: As directed    Call MD for:  severe uncontrolled pain   Complete by: As directed    Call MD for:  temperature >100.4   Complete by: As directed    Diet - low sodium heart healthy   Complete by: As directed    Incentive spirometry RT   Complete by: As directed    Increase activity slowly   Complete by: As directed       Allergies as of 05/28/2023       Reactions   Atorvastatin Other (See Comments)   Muscle pain   Codeine Nausea Only   Morphine Hives   Other reaction(s): Hives/Skin Rash, makes her crazy   Morphine And Codeine Hives   Sulfa Antibiotics Rash        Medication List     TAKE these medications    acetaminophen 500 MG tablet Commonly known as: TYLENOL Take 500 mg by mouth every 6 (six) hours as needed for moderate pain or  headache.   acidophilus Caps capsule Take by mouth daily.   amitriptyline 25 MG tablet Commonly known as: ELAVIL Take 25 mg by mouth at bedtime.   aspirin EC 81 MG tablet Take 81 mg by mouth at bedtime.   Biotin 1 MG Caps Take 1 mg by mouth daily.   clobetasol ointment 0.05 % Commonly known as: TEMOVATE Apply 1 application topically 2 (two) times daily. Apply as directed twice daily.  Do not use for more than 5 days.   dexamethasone 1 MG tablet Commonly known as: DECADRON 2 tablets twice daily for 2 days, one tablet twice daily for 2 days, one tablet daily for 2 days.   diazepam 5 MG tablet Commonly known as: Valium Take 1 tablet (5 mg total) by mouth every 6 (six) hours as needed for muscle spasms.   estradiol 0.1 MG/GM vaginal cream Commonly known as: ESTRACE VAGINAL Apply pea size amount topically daily.   fluticasone 50 MCG/ACT nasal spray Commonly known as: FLONASE Place 1 spray into both nostrils daily as needed for allergies.   ibuprofen 200 MG tablet Commonly known as: ADVIL Take 200 mg by mouth every 8 (eight) hours as needed for headache or moderate pain.   levothyroxine 50 MCG tablet Commonly known as: SYNTHROID Take 50 mcg by mouth daily before breakfast.   LORazepam 0.5 MG tablet Commonly known as: ATIVAN Take 0.5 mg by mouth daily as needed for anxiety.   mometasone 0.1 % ointment Commonly known as:  ELOCON Apply topically twice weekly   olmesartan 40 MG tablet Commonly known as: BENICAR Take 40 mg by mouth at bedtime.   ondansetron 4 MG tablet Commonly known as: ZOFRAN Take 4 mg by mouth every 8 (eight) hours as needed for nausea/vomiting.   oxyCODONE-acetaminophen 5-325 MG tablet Commonly known as: PERCOCET/ROXICET Take 1 tablet by mouth every 4 (four) hours as needed for moderate pain (pain score 4-6) or severe pain (pain score 7-10).   pantoprazole 40 MG tablet Commonly known as: PROTONIX Take 40 mg by mouth daily.   ProAir HFA 108  (90 Base) MCG/ACT inhaler Generic drug: albuterol Inhale 2 puffs into the lungs every 6 (six) hours as needed for wheezing or shortness of breath.   rosuvastatin 20 MG tablet Commonly known as: CRESTOR Take 1 tablet (20 mg total) by mouth daily.   triamcinolone 55 MCG/ACT Aero nasal inhaler Commonly known as: NASACORT Place 1 spray into the nose daily as needed (for allergies).   Vitamin D 50 MCG (2000 UT) tablet Take 2,000 Units by mouth daily.         Signed: [name] 05/28/2023, 8:37 AM

## 2023-05-28 NOTE — Evaluation (Signed)
Occupational Therapy Evaluation Patient Details Name: Sabrina Parks MRN: [id number] DOB: Jul 10, 1941 Today's Date: 05/28/2023   History of Present Illness Sabrina Parks is a 82 yo female who underwent ANTERIOR CERVICAL DECOMPRESSION/DISCECTOMY FUSION 3 LEVELS CERVICAL FOUR-CERVICAL FIVE - CERVICAL FIVE-CERIVCAL SIX -CERVICAL SIX-CERVICAL SEVEN 10/29. PMHx: anxiety, cancer, DJD, GERD, HSV-1, HLD, HTN, hypothyroidism   Clinical Impression   Sabrina Parks was evaluated s/p the above spine surgery. She is indep and lives with her husband at baseline. Upon evaluation pt was limited by mild surgical pain, cervical precautions and decreased activity tolerance. Overall she demonstrated mod I ability to complete transfers, mobility, stair negotiation and ADLs without DME. Provided cues and education on spinal precautions and compensatory techniques throughout, handout provided and pt demonstrated great recall during ADLs and mobility. Pt does not require further acute OT services. Recommend d/c home with support of family.         If plan is discharge home, recommend the following: A little help with bathing/dressing/bathroom;Assistance with cooking/housework;Assist for transportation    Functional Status Assessment  Patient has had a recent decline in their functional status and demonstrates the ability to make significant improvements in function in a reasonable and predictable amount of time.  Equipment Recommendations  None recommended by OT       Precautions / Restrictions Precautions Precautions: Fall;Cervical Precaution Booklet Issued: Yes (comment) Restrictions Weight Bearing Restrictions: No      Mobility Bed Mobility Overal bed mobility: Needs Assistance Bed Mobility: Rolling, Sidelying to Sit Rolling: Supervision Sidelying to sit: Supervision       General bed mobility comments: cues for log roll    Transfers Overall transfer level: Modified independent Equipment used: None            Balance Overall balance assessment: No apparent balance deficits (not formally assessed)             ADL either performed or assessed with clinical judgement   ADL Overall ADL's : Modified independent         General ADL Comments: after education on cervial precautions and compensatory techniques was given pt demonstrated mod I ability to complete ADLs, mobility and stair negotitation. No DME required.     Vision Baseline Vision/History: 1 Wears glasses Vision Assessment?: No apparent visual deficits     Perception Perception: Within Functional Limits       Praxis Praxis: WFL       Pertinent Vitals/Pain Pain Assessment Pain Assessment: Faces Faces Pain Scale: Hurts little more Pain Location: surgical site Pain Descriptors / Indicators: Grimacing Pain Intervention(s): Limited activity within patient's tolerance, Monitored during session     Extremity/Trunk Assessment Upper Extremity Assessment Upper Extremity Assessment: Overall WFL for tasks assessed   Lower Extremity Assessment Lower Extremity Assessment: Generalized weakness   Cervical / Trunk Assessment Cervical / Trunk Assessment: Neck Surgery   Communication Communication Communication: No apparent difficulties   Cognition Arousal: Alert Behavior During Therapy: WFL for tasks assessed/performed Overall Cognitive Status: Within Functional Limits for tasks assessed             General Comments  VSS on RA     Home Living Family/patient expects to be discharged to:: Private residence Living Arrangements: Spouse/significant other Available Help at Discharge: Family;Available 24 hours/day Type of Home: House Home Access: Stairs to enter [employer] of Steps: 7 Entrance Stairs-Rails: Right;Left Home Layout: Two level;Able to live on main level with bedroom/bathroom     Bathroom Shower/Tub: Producer, television/film/video: Handicapped height  Home Equipment: None  (husband has equip. at home if pt needed)   Additional Comments: pt's husband has cerebellar ataxia per pt      Prior Functioning/Environment Prior Level of Function : Independent/Modified Independent;Driving             Mobility Comments: no AD ADLs Comments: indep, ADL/IADLs        OT Problem List: Decreased activity tolerance;Impaired balance (sitting and/or standing);Decreased knowledge of precautions         OT Goals(Current goals can be found in the care plan section) Acute Rehab OT Goals Patient Stated Goal: home OT Goal Formulation: With patient Time For Goal Achievement: 05/28/23 Potential to Achieve Goals: Good   AM-PAC OT "6 Clicks" Daily Activity     Outcome Measure Help from another person eating meals?: None Help from another person taking care of personal grooming?: None Help from another person toileting, which includes using toliet, bedpan, or urinal?: None Help from another person bathing (including washing, rinsing, drying)?: None Help from another person to put on and taking off regular upper body clothing?: None Help from another person to put on and taking off regular lower body clothing?: None 6 Click Score: 24   End of Session Nurse Communication: Mobility status  Activity Tolerance: Patient tolerated treatment well Patient left: in bed;with call bell/phone within reach  OT Visit Diagnosis: Muscle weakness (generalized) (M62.81)                Time: 4132-4401 OT Time Calculation (min): 20 min Charges:  OT General Charges $OT Visit: 1 Visit OT Evaluation $OT Eval Moderate Complexity: 1 Mod  [name], OTR/L Acute Rehabilitation Services Office [phone] Secure Chat Communication Preferred   [name] 05/28/2023, 9:45 AM

## 2023-06-18 DIAGNOSIS — M4712 Other spondylosis with myelopathy, cervical region: Secondary | ICD-10-CM | POA: Diagnosis not present

## 2023-06-23 ENCOUNTER — Ambulatory Visit (HOSPITAL_COMMUNITY)
Admission: RE | Admit: 2023-06-23 | Discharge: 2023-06-23 | Disposition: A | Discharge status: Home / Self Care | Payer: Medicare Other | Source: Ambulatory Visit | Attending: Surgery | Admitting: Surgery

## 2023-06-23 ENCOUNTER — Other Ambulatory Visit (HOSPITAL_COMMUNITY): Payer: Self-pay | Admitting: Adult Health

## 2023-06-23 DIAGNOSIS — M7989 Other specified soft tissue disorders: Secondary | ICD-10-CM | POA: Insufficient documentation

## 2023-06-23 DIAGNOSIS — M79605 Pain in left leg: Secondary | ICD-10-CM

## 2023-06-23 DIAGNOSIS — M79662 Pain in left lower leg: Secondary | ICD-10-CM | POA: Diagnosis not present

## 2023-06-23 DIAGNOSIS — I10 Essential (primary) hypertension: Secondary | ICD-10-CM | POA: Diagnosis not present

## 2023-06-23 DIAGNOSIS — Z9889 Other specified postprocedural states: Secondary | ICD-10-CM | POA: Diagnosis not present

## 2023-07-14 ENCOUNTER — Other Ambulatory Visit: Payer: Self-pay

## 2023-07-14 ENCOUNTER — Ambulatory Visit: Payer: Medicare Other

## 2023-07-14 DIAGNOSIS — I10 Essential (primary) hypertension: Secondary | ICD-10-CM | POA: Diagnosis not present

## 2023-07-14 DIAGNOSIS — E871 Hypo-osmolality and hyponatremia: Secondary | ICD-10-CM | POA: Diagnosis not present

## 2023-07-14 DIAGNOSIS — E785 Hyperlipidemia, unspecified: Secondary | ICD-10-CM | POA: Diagnosis not present

## 2023-07-14 DIAGNOSIS — Z79899 Other long term (current) drug therapy: Secondary | ICD-10-CM

## 2023-07-16 LAB — NMR, LIPOPROFILE
Cholesterol, Total: 213 mg/dL — ABNORMAL HIGH (ref 100–199)
Cholesterol, Total: 217 mg/dL — ABNORMAL HIGH (ref 100–199)
HDL Particle Number: 49.2 umol/L (ref >=30.5)
HDL Particle Number: 50 umol/L (ref >=30.5)
HDL-C: 78 mg/dL (ref >39)
HDL-C: 79 mg/dL (ref >39)
LDL Particle Number: 1249 nmol/L — ABNORMAL HIGH (ref <1000)
LDL Particle Number: 1252 nmol/L — ABNORMAL HIGH (ref <1000)
LDL Size: 21.2 nmol (ref >20.5)
LDL Size: 21.2 nmol (ref >20.5)
LDL-C (NIH Calc): 111 mg/dL — ABNORMAL HIGH (ref 0–99)
LDL-C (NIH Calc): 116 mg/dL — ABNORMAL HIGH (ref 0–99)
LP-IR Score: 40 (ref <=45)
LP-IR Score: 44 (ref <=45)
Small LDL Particle Number: 485 nmol/L (ref <=527)
Small LDL Particle Number: 639 nmol/L — ABNORMAL HIGH (ref <=527)
Triglycerides: 134 mg/dL (ref 0–149)
Triglycerides: 134 mg/dL (ref 0–149)

## 2023-07-17 DIAGNOSIS — M5417 Radiculopathy, lumbosacral region: Secondary | ICD-10-CM | POA: Diagnosis not present

## 2023-07-17 DIAGNOSIS — M5116 Intervertebral disc disorders with radiculopathy, lumbar region: Secondary | ICD-10-CM | POA: Diagnosis not present

## 2023-08-04 NOTE — Telephone Encounter (Signed)
 Lets keep on same meds    Check later in spring (April)   Get back on routine

## 2023-08-06 MED ORDER — ROSUVASTATIN CALCIUM 20 MG PO TABS: 20.0000 mg | ORAL_TABLET | Freq: Every day | ORAL | 3 refills | Status: AC

## 2023-08-06 NOTE — Addendum Note (Signed)
 Addended by: Bertram Millard on: 08/06/2023 11:49 AM   Modules accepted: Orders

## 2023-09-01 DIAGNOSIS — N390 Urinary tract infection, site not specified: Secondary | ICD-10-CM | POA: Diagnosis not present

## 2023-09-08 DIAGNOSIS — N3000 Acute cystitis without hematuria: Secondary | ICD-10-CM | POA: Diagnosis not present

## 2023-09-10 DIAGNOSIS — H353211 Exudative age-related macular degeneration, right eye, with active choroidal neovascularization: Secondary | ICD-10-CM | POA: Diagnosis not present

## 2023-10-17 DIAGNOSIS — N3000 Acute cystitis without hematuria: Secondary | ICD-10-CM | POA: Diagnosis not present

## 2023-10-21 DIAGNOSIS — R110 Nausea: Secondary | ICD-10-CM | POA: Diagnosis not present

## 2023-10-21 DIAGNOSIS — K449 Diaphragmatic hernia without obstruction or gangrene: Secondary | ICD-10-CM | POA: Diagnosis not present

## 2023-10-21 DIAGNOSIS — Z860100 Personal history of colon polyps, unspecified: Secondary | ICD-10-CM | POA: Diagnosis not present

## 2023-10-21 DIAGNOSIS — D123 Benign neoplasm of transverse colon: Secondary | ICD-10-CM | POA: Diagnosis not present

## 2023-10-21 DIAGNOSIS — K5730 Diverticulosis of large intestine without perforation or abscess without bleeding: Secondary | ICD-10-CM | POA: Diagnosis not present

## 2023-10-21 DIAGNOSIS — Z09 Encounter for follow-up examination after completed treatment for conditions other than malignant neoplasm: Secondary | ICD-10-CM | POA: Diagnosis not present

## 2023-10-21 DIAGNOSIS — Z800 Family history of malignant neoplasm of digestive organs: Secondary | ICD-10-CM | POA: Diagnosis not present

## 2023-10-21 DIAGNOSIS — K219 Gastro-esophageal reflux disease without esophagitis: Secondary | ICD-10-CM | POA: Diagnosis not present

## 2023-10-23 DIAGNOSIS — D123 Benign neoplasm of transverse colon: Secondary | ICD-10-CM | POA: Diagnosis not present

## 2023-10-30 DIAGNOSIS — N644 Mastodynia: Secondary | ICD-10-CM | POA: Diagnosis not present

## 2023-10-30 DIAGNOSIS — N6459 Other signs and symptoms in breast: Secondary | ICD-10-CM | POA: Diagnosis not present

## 2023-11-10 DIAGNOSIS — H353121 Nonexudative age-related macular degeneration, left eye, early dry stage: Secondary | ICD-10-CM | POA: Diagnosis not present

## 2023-11-10 DIAGNOSIS — H35372 Puckering of macula, left eye: Secondary | ICD-10-CM | POA: Diagnosis not present

## 2023-11-10 DIAGNOSIS — H35363 Drusen (degenerative) of macula, bilateral: Secondary | ICD-10-CM | POA: Diagnosis not present

## 2023-11-10 DIAGNOSIS — H353211 Exudative age-related macular degeneration, right eye, with active choroidal neovascularization: Secondary | ICD-10-CM | POA: Diagnosis not present

## 2023-11-20 DIAGNOSIS — N644 Mastodynia: Secondary | ICD-10-CM | POA: Diagnosis not present

## 2023-11-20 DIAGNOSIS — N6459 Other signs and symptoms in breast: Secondary | ICD-10-CM | POA: Diagnosis not present

## 2023-11-20 DIAGNOSIS — N6315 Unspecified lump in the right breast, overlapping quadrants: Secondary | ICD-10-CM | POA: Diagnosis not present

## 2023-12-10 ENCOUNTER — Other Ambulatory Visit (INDEPENDENT_AMBULATORY_CARE_PROVIDER_SITE_OTHER): Payer: Self-pay

## 2023-12-10 ENCOUNTER — Ambulatory Visit: Admitting: Orthopaedic Surgery

## 2023-12-10 DIAGNOSIS — M25551 Pain in right hip: Secondary | ICD-10-CM

## 2023-12-10 DIAGNOSIS — M25552 Pain in left hip: Secondary | ICD-10-CM | POA: Diagnosis not present

## 2023-12-10 DIAGNOSIS — M7061 Trochanteric bursitis, right hip: Secondary | ICD-10-CM

## 2023-12-10 DIAGNOSIS — M7062 Trochanteric bursitis, left hip: Secondary | ICD-10-CM | POA: Diagnosis not present

## 2023-12-10 MED ORDER — LIDOCAINE HCL 1 % IJ SOLN
3.0000 mL | INTRAMUSCULAR | Status: AC | PRN
  Administered 2023-12-10: 3 mL

## 2023-12-10 MED ORDER — METHYLPREDNISOLONE ACETATE 40 MG/ML IJ SUSP
40.0000 mg | INTRAMUSCULAR | Status: AC | PRN
  Administered 2023-12-10: 40 mg via INTRA_ARTICULAR

## 2023-12-10 NOTE — Progress Notes (Signed)
 The patient is an 83 year old female former patient of Dr. Aviva Lemmings who comes in with bilateral hip pain over the trochanteric area of both hips.  She wonders if she has bursitis issues.  She says she is increasing her activities and this happens more with walking.  She does get occasional clicking in her left hip.  She also very occasionally has been having some left knee issues.  She denies any groin pain.  She has had steroid injections in the past and she said those are helpful but does make her feel jittery.  She is not diabetic.  Examination both hips show them smoothly and fluidly.  Her pain is over the trochanteric area on both sides.  There is no pain in the groin at all.  An AP pelvis and lateral both hips shows only minimal arthritic changes.  Her signs and symptoms on both sides are consistent with trochanteric bursitis and I did have her lay on both sides.  She says it does wake her up at night when she lays on the sides.  She did request a steroid injection on her more symptomatic left side today.  She did tolerate this well over the trochanteric area.  I recommended that she try Voltaren  gel twice daily on both hips and have shown her stretching exercises to try twice daily.  We will see her back in a month to consider a steroid injection in her right hip trochanteric area and we can always reassess her left knee.  I have recommended a copper fit knee sleeve.    Procedure Note  Patient: Sabrina Parks             Date of Birth: 30-Jun-1941           MRN: [id number]             Visit Date: 12/10/2023  Procedures: Visit Diagnoses:  1. Pain of left hip   2. Pain of right hip   3. Trochanteric bursitis, left hip   4. Trochanteric bursitis, right hip     Large Joint Inj: L greater trochanter on 12/10/2023 10:42 AM Indications: pain and diagnostic evaluation Details: 22 G 1.5 in needle, lateral approach  Arthrogram: No  Medications: 3 mL lidocaine  1 %; 40 mg methylPREDNISolone   acetate 40 MG/ML Outcome: tolerated well, no immediate complications Procedure, treatment alternatives, risks and benefits explained, specific risks discussed. Consent was given by the patient. Immediately prior to procedure a time out was called to verify the correct patient, procedure, equipment, support staff and site/side marked as required. Patient was prepped and draped in the usual sterile fashion.

## 2023-12-16 DIAGNOSIS — M5117 Intervertebral disc disorders with radiculopathy, lumbosacral region: Secondary | ICD-10-CM | POA: Diagnosis not present

## 2023-12-16 DIAGNOSIS — M5417 Radiculopathy, lumbosacral region: Secondary | ICD-10-CM | POA: Diagnosis not present

## 2023-12-30 DIAGNOSIS — H353211 Exudative age-related macular degeneration, right eye, with active choroidal neovascularization: Secondary | ICD-10-CM | POA: Diagnosis not present

## 2023-12-30 DIAGNOSIS — H43813 Vitreous degeneration, bilateral: Secondary | ICD-10-CM | POA: Diagnosis not present

## 2023-12-30 DIAGNOSIS — H04123 Dry eye syndrome of bilateral lacrimal glands: Secondary | ICD-10-CM | POA: Diagnosis not present

## 2023-12-30 DIAGNOSIS — H52203 Unspecified astigmatism, bilateral: Secondary | ICD-10-CM | POA: Diagnosis not present

## 2023-12-30 DIAGNOSIS — H524 Presbyopia: Secondary | ICD-10-CM | POA: Diagnosis not present

## 2024-01-13 DIAGNOSIS — Z85828 Personal history of other malignant neoplasm of skin: Secondary | ICD-10-CM | POA: Diagnosis not present

## 2024-01-13 DIAGNOSIS — D692 Other nonthrombocytopenic purpura: Secondary | ICD-10-CM | POA: Diagnosis not present

## 2024-01-13 DIAGNOSIS — L821 Other seborrheic keratosis: Secondary | ICD-10-CM | POA: Diagnosis not present

## 2024-01-13 DIAGNOSIS — L570 Actinic keratosis: Secondary | ICD-10-CM | POA: Diagnosis not present

## 2024-01-13 DIAGNOSIS — L738 Other specified follicular disorders: Secondary | ICD-10-CM | POA: Diagnosis not present

## 2024-01-13 DIAGNOSIS — L820 Inflamed seborrheic keratosis: Secondary | ICD-10-CM | POA: Diagnosis not present

## 2024-01-14 ENCOUNTER — Ambulatory Visit: Admitting: Orthopaedic Surgery

## 2024-01-15 ENCOUNTER — Ambulatory Visit: Admitting: Orthopaedic Surgery

## 2024-01-29 DIAGNOSIS — H353211 Exudative age-related macular degeneration, right eye, with active choroidal neovascularization: Secondary | ICD-10-CM | POA: Diagnosis not present

## 2024-02-04 DIAGNOSIS — I10 Essential (primary) hypertension: Secondary | ICD-10-CM | POA: Diagnosis not present

## 2024-02-04 DIAGNOSIS — K219 Gastro-esophageal reflux disease without esophagitis: Secondary | ICD-10-CM | POA: Diagnosis not present

## 2024-02-04 DIAGNOSIS — E039 Hypothyroidism, unspecified: Secondary | ICD-10-CM | POA: Diagnosis not present

## 2024-02-04 DIAGNOSIS — R7301 Impaired fasting glucose: Secondary | ICD-10-CM | POA: Diagnosis not present

## 2024-02-04 DIAGNOSIS — E785 Hyperlipidemia, unspecified: Secondary | ICD-10-CM | POA: Diagnosis not present

## 2024-02-04 DIAGNOSIS — D649 Anemia, unspecified: Secondary | ICD-10-CM | POA: Diagnosis not present

## 2024-02-11 DIAGNOSIS — E785 Hyperlipidemia, unspecified: Secondary | ICD-10-CM | POA: Diagnosis not present

## 2024-02-11 DIAGNOSIS — G500 Trigeminal neuralgia: Secondary | ICD-10-CM | POA: Diagnosis not present

## 2024-02-11 DIAGNOSIS — R7301 Impaired fasting glucose: Secondary | ICD-10-CM | POA: Diagnosis not present

## 2024-02-11 DIAGNOSIS — J45998 Other asthma: Secondary | ICD-10-CM | POA: Diagnosis not present

## 2024-02-11 DIAGNOSIS — I10 Essential (primary) hypertension: Secondary | ICD-10-CM | POA: Diagnosis not present

## 2024-02-11 DIAGNOSIS — Z8673 Personal history of transient ischemic attack (TIA), and cerebral infarction without residual deficits: Secondary | ICD-10-CM | POA: Diagnosis not present

## 2024-02-11 DIAGNOSIS — K219 Gastro-esophageal reflux disease without esophagitis: Secondary | ICD-10-CM | POA: Diagnosis not present

## 2024-02-11 DIAGNOSIS — R82998 Other abnormal findings in urine: Secondary | ICD-10-CM | POA: Diagnosis not present

## 2024-02-11 DIAGNOSIS — D692 Other nonthrombocytopenic purpura: Secondary | ICD-10-CM | POA: Diagnosis not present

## 2024-02-11 DIAGNOSIS — Z1331 Encounter for screening for depression: Secondary | ICD-10-CM | POA: Diagnosis not present

## 2024-02-11 DIAGNOSIS — H353230 Exudative age-related macular degeneration, bilateral, stage unspecified: Secondary | ICD-10-CM | POA: Diagnosis not present

## 2024-02-11 DIAGNOSIS — Z1339 Encounter for screening examination for other mental health and behavioral disorders: Secondary | ICD-10-CM | POA: Diagnosis not present

## 2024-02-11 DIAGNOSIS — R911 Solitary pulmonary nodule: Secondary | ICD-10-CM | POA: Diagnosis not present

## 2024-02-11 DIAGNOSIS — I700 Atherosclerosis of aorta: Secondary | ICD-10-CM | POA: Diagnosis not present

## 2024-02-11 DIAGNOSIS — Z0000 Encounter for general adult medical examination without abnormal findings: Secondary | ICD-10-CM | POA: Diagnosis not present

## 2024-02-11 DIAGNOSIS — D320 Benign neoplasm of cerebral meninges: Secondary | ICD-10-CM | POA: Diagnosis not present

## 2024-02-11 DIAGNOSIS — E039 Hypothyroidism, unspecified: Secondary | ICD-10-CM | POA: Diagnosis not present

## 2024-02-25 DIAGNOSIS — M545 Low back pain, unspecified: Secondary | ICD-10-CM | POA: Diagnosis not present

## 2024-02-25 DIAGNOSIS — R6883 Chills (without fever): Secondary | ICD-10-CM | POA: Diagnosis not present

## 2024-02-25 DIAGNOSIS — R52 Pain, unspecified: Secondary | ICD-10-CM | POA: Diagnosis not present

## 2024-02-25 DIAGNOSIS — Z1152 Encounter for screening for COVID-19: Secondary | ICD-10-CM | POA: Diagnosis not present

## 2024-02-25 DIAGNOSIS — N390 Urinary tract infection, site not specified: Secondary | ICD-10-CM | POA: Diagnosis not present

## 2024-02-25 DIAGNOSIS — R8281 Pyuria: Secondary | ICD-10-CM | POA: Diagnosis not present

## 2024-03-02 ENCOUNTER — Other Ambulatory Visit: Payer: Self-pay | Admitting: Internal Medicine

## 2024-03-02 DIAGNOSIS — R911 Solitary pulmonary nodule: Secondary | ICD-10-CM

## 2024-03-05 ENCOUNTER — Ambulatory Visit
Admission: RE | Admit: 2024-03-05 | Discharge: 2024-03-05 | Disposition: A | Discharge status: Home / Self Care | Source: Ambulatory Visit | Attending: Internal Medicine | Admitting: Internal Medicine

## 2024-03-05 DIAGNOSIS — R911 Solitary pulmonary nodule: Secondary | ICD-10-CM

## 2024-03-05 DIAGNOSIS — R918 Other nonspecific abnormal finding of lung field: Secondary | ICD-10-CM | POA: Diagnosis not present

## 2024-03-08 ENCOUNTER — Other Ambulatory Visit: Payer: Self-pay

## 2024-03-08 ENCOUNTER — Emergency Department (HOSPITAL_COMMUNITY)

## 2024-03-08 ENCOUNTER — Encounter (HOSPITAL_COMMUNITY): Payer: Self-pay

## 2024-03-08 ENCOUNTER — Emergency Department (HOSPITAL_COMMUNITY)
Admission: EM | Admit: 2024-03-08 | Discharge: 2024-03-08 | Disposition: A | Discharge status: Home / Self Care | Attending: Emergency Medicine | Admitting: Emergency Medicine

## 2024-03-08 DIAGNOSIS — Z79899 Other long term (current) drug therapy: Secondary | ICD-10-CM | POA: Diagnosis not present

## 2024-03-08 DIAGNOSIS — I4891 Unspecified atrial fibrillation: Secondary | ICD-10-CM | POA: Insufficient documentation

## 2024-03-08 DIAGNOSIS — R42 Dizziness and giddiness: Secondary | ICD-10-CM | POA: Diagnosis not present

## 2024-03-08 DIAGNOSIS — R071 Chest pain on breathing: Secondary | ICD-10-CM | POA: Diagnosis not present

## 2024-03-08 DIAGNOSIS — R0602 Shortness of breath: Secondary | ICD-10-CM | POA: Insufficient documentation

## 2024-03-08 DIAGNOSIS — I10 Essential (primary) hypertension: Secondary | ICD-10-CM | POA: Diagnosis not present

## 2024-03-08 DIAGNOSIS — Z7901 Long term (current) use of anticoagulants: Secondary | ICD-10-CM | POA: Diagnosis not present

## 2024-03-08 DIAGNOSIS — R002 Palpitations: Secondary | ICD-10-CM | POA: Diagnosis not present

## 2024-03-08 LAB — CBC WITH DIFFERENTIAL/PLATELET
Abs Immature Granulocytes: 0.04 K/uL (ref 0.00–0.07)
Basophils Absolute: 0 K/uL (ref 0.0–0.1)
Basophils Relative: 0 %
Eosinophils Absolute: 0.1 K/uL (ref 0.0–0.5)
Eosinophils Relative: 1 %
HCT: 37.5 % (ref 36.0–46.0)
Hemoglobin: 12.4 g/dL (ref 12.0–15.0)
Immature Granulocytes: 1 %
Lymphocytes Relative: 23 %
Lymphs Abs: 1.8 K/uL (ref 0.7–4.0)
MCH: 28.3 pg (ref 26.0–34.0)
MCHC: 33.1 g/dL (ref 30.0–36.0)
MCV: 85.6 fL (ref 80.0–100.0)
Monocytes Absolute: 0.7 K/uL (ref 0.1–1.0)
Monocytes Relative: 9 %
Neutro Abs: 5.3 K/uL (ref 1.7–7.7)
Neutrophils Relative %: 66 %
Platelets: 333 K/uL (ref 150–400)
RBC: 4.38 MIL/uL (ref 3.87–5.11)
RDW: 13.3 % (ref 11.5–15.5)
WBC: 8 K/uL (ref 4.0–10.5)
nRBC: 0 % (ref 0.0–0.2)

## 2024-03-08 LAB — URINALYSIS, ROUTINE W REFLEX MICROSCOPIC
Bilirubin Urine: NEGATIVE
Glucose, UA: NEGATIVE mg/dL
Hgb urine dipstick: NEGATIVE
Ketones, ur: NEGATIVE mg/dL
Nitrite: NEGATIVE
Protein, ur: NEGATIVE mg/dL
Specific Gravity, Urine: 1.012 (ref 1.005–1.030)
pH: 7 (ref 5.0–8.0)

## 2024-03-08 LAB — COMPREHENSIVE METABOLIC PANEL WITH GFR
ALT: 22 U/L (ref 0–44)
AST: 25 U/L (ref 15–41)
Albumin: 3.9 g/dL (ref 3.5–5.0)
Alkaline Phosphatase: 77 U/L (ref 38–126)
Anion gap: 13 (ref 5–15)
BUN: 18 mg/dL (ref 8–23)
CO2: 22 mmol/L (ref 22–32)
Calcium: 9.7 mg/dL (ref 8.9–10.3)
Chloride: 96 mmol/L — ABNORMAL LOW (ref 98–111)
Creatinine, Ser: 0.69 mg/dL (ref 0.44–1.00)
GFR, Estimated: >60 mL/min (ref >60)
Glucose, Bld: 100 mg/dL — ABNORMAL HIGH (ref 70–99)
Potassium: 4.8 mmol/L (ref 3.5–5.1)
Sodium: 131 mmol/L — ABNORMAL LOW (ref 135–145)
Total Bilirubin: 0.9 mg/dL (ref 0.0–1.2)
Total Protein: 6.4 g/dL — ABNORMAL LOW (ref 6.5–8.1)

## 2024-03-08 LAB — TSH: TSH: 0.943 u[IU]/mL (ref 0.350–4.500)

## 2024-03-08 LAB — MAGNESIUM: Magnesium: 2.1 mg/dL (ref 1.7–2.4)

## 2024-03-08 LAB — TROPONIN I (HIGH SENSITIVITY): Troponin I (High Sensitivity): 10 ng/L (ref <18)

## 2024-03-08 LAB — BRAIN NATRIURETIC PEPTIDE: B Natriuretic Peptide: 22.5 pg/mL (ref 0.0–100.0)

## 2024-03-08 MED ORDER — METOPROLOL TARTRATE 25 MG PO TABS: 12.5000 mg | ORAL_TABLET | Freq: Two times a day (BID) | ORAL | 0 refills | Status: DC

## 2024-03-08 MED ORDER — METOPROLOL TARTRATE 5 MG/5ML IV SOLN
5.0000 mg | Freq: Once | INTRAVENOUS | Status: AC
  Administered 2024-03-08 (×2): 5 mg via INTRAVENOUS
  Filled 2024-03-08: qty 5

## 2024-03-08 MED ORDER — METOPROLOL TARTRATE 25 MG PO TABS
12.5000 mg | ORAL_TABLET | Freq: Once | ORAL | Status: AC
  Administered 2024-03-08 (×2): 12.5 mg via ORAL
  Filled 2024-03-08: qty 1

## 2024-03-08 MED ORDER — APIXABAN 2.5 MG PO TABS: 2.5000 mg | ORAL_TABLET | Freq: Two times a day (BID) | ORAL | 0 refills | Status: DC

## 2024-03-08 MED ORDER — APIXABAN 2.5 MG PO TABS
2.5000 mg | ORAL_TABLET | Freq: Once | ORAL | Status: AC
  Administered 2024-03-08 (×2): 2.5 mg via ORAL
  Filled 2024-03-08: qty 1

## 2024-03-08 NOTE — Discharge Instructions (Addendum)
 Discontinue aspirin.  Start Eliquis  as prescribed.  Take metoprolol  as prescribed.  Take these medications in the morning.  Return if symptoms worsen.  Follow-up with cardiology.

## 2024-03-08 NOTE — ED Notes (Signed)
 EMS stuck pt twice and RN atuck pt twice to attempt blood work- unsuccessful

## 2024-03-08 NOTE — ED Provider Triage Note (Signed)
 Emergency Medicine Provider Triage Evaluation Note  Sabrina Parks , a 83 y.o. female  was evaluated in triage.  Pt complains of palpitations.  Patient reports that she is having palpitations off and on for the past few weeks.  Felt they were faster today.  She does not have any chest pain or shortness of breath associated with the palpitations.  She reports that maybe for the past month that she has felt a little short of breath on exertion like walking the dog or going up the stairs which has been new for her.  She denies any chest pain at any time.  She does wear an Apple Watch and reports that her Apple Watch has told her that she has had a fast heart rate that has not given her any other reading.  She denies any new medications.  Reports that she does see a cardiologist because of a left bundle branch block.  No other recent cough or cold symptoms.  Review of Systems  Positive:  Negative:   Physical Exam  Ht 5' 3 (1.6 m)   Wt 59 kg   LMP 07/29/1990   BMI 23.03 kg/m  Gen:   Awake, no distress   Resp:  Normal effort  MSK:   Moves extremities without difficulty  Other:  Irregularly irregular rhythm.  Patient does not appear in any acute distress.  Palpable radial pulses that are symmetric  Medical Decision Making  Medically screening exam initiated at 1:31 PM.  Appropriate orders placed.  Dvora Buitron was informed that the remainder of the evaluation will be completed by another provider, this initial triage assessment does not replace that evaluation, and the importance of remaining in the ED until their evaluation is complete.  EMS reports patient was in A-fib RVR.  Her pulse rate does feel regular irregular however does not feel rapid.  Currently getting EKG.  Will order labs.  Charge nurse aware the patient needs a room now.   Bernis Ernst, [place] 03/08/24 1333

## 2024-03-08 NOTE — ED Triage Notes (Signed)
 C/O palpations for the past 2 hours. Denies CP/SHOB. C/O lightheadedness. No hx of afib. EMS states pt was afib rvr.SABRA axox4.

## 2024-03-08 NOTE — ED Provider Notes (Signed)
 [place] EMERGENCY DEPARTMENT AT [facility] Provider Note   CSN: [id number] Arrival date & time: 03/08/24  1315     Patient presents with: Palpitations   Sabrina Parks is a 83 y.o. female.   Patient here with palpitations on and off for the last few weeks.  Felt faster today than normal.  No chest pain or shortness of breath.  She has not had any URI symptoms or infectious symptoms.  No weakness numbness tingling.  No stroke symptoms.  No fever or chills.  EMS found her to be in A-fib with RVR.  She is feeling better now my evaluation.  She has no history of A-fib.  No history of stroke.  Does have history of high cholesterol hypertension.  The history is provided by the patient.       Prior to Admission medications   Medication Sig Start Date End Date Taking? Authorizing Provider  apixaban  (ELIQUIS ) 2.5 MG TABS tablet Take 1 tablet (2.5 mg total) by mouth 2 (two) times daily. 03/08/24 04/07/24 Yes Lorenia Hoston, DO  metoprolol  tartrate (LOPRESSOR ) 25 MG tablet Take 0.5 tablets (12.5 mg total) by mouth 2 (two) times daily. 03/08/24 04/07/24 Yes Jafeth Mustin, DO  acetaminophen  (TYLENOL ) 500 MG tablet Take 500 mg by mouth every 6 (six) hours as needed for moderate pain or headache.    [provider]  acidophilus (RISAQUAD) CAPS capsule Take by mouth daily.    [provider]  amitriptyline  (ELAVIL ) 25 MG tablet Take 25 mg by mouth at bedtime.  07/08/13   [provider]  Biotin  1 MG CAPS Take 1 mg by mouth daily. 01/23/22   [provider]  Cholecalciferol (VITAMIN D ) 50 MCG (2000 UT) tablet Take 2,000 Units by mouth daily.    [provider]  clobetasol  ointment (TEMOVATE ) 0.05 % Apply 1 application topically 2 (two) times daily. Apply as directed twice daily.  Do not use for more than 5 days. Patient not taking: Reported on 05/21/2023 07/16/21   Cleotilde Ronal RAMAN, MD  diazepam  (VALIUM ) 5 MG tablet Take 1 tablet (5 mg total) by  mouth every 6 (six) hours as needed for muscle spasms. 05/28/23 05/27/24  Colon Shove, MD  estradiol  (ESTRACE  VAGINAL) 0.1 MG/GM vaginal cream Apply pea size amount topically daily. Patient not taking: Reported on 05/21/2023 08/29/21   Cleotilde Ronal RAMAN, MD  fluticasone  (FLONASE ) 50 MCG/ACT nasal spray Place 1 spray into both nostrils daily as needed for allergies. 05/03/21   [provider]  ibuprofen (ADVIL,MOTRIN) 200 MG tablet Take 200 mg by mouth every 8 (eight) hours as needed for headache or moderate pain.     [provider]  levothyroxine  (SYNTHROID , LEVOTHROID) 50 MCG tablet Take 50 mcg by mouth daily before breakfast.    [provider]  LORazepam  (ATIVAN ) 0.5 MG tablet Take 0.5 mg by mouth daily as needed for anxiety.  07/29/13   [provider]  mometasone  (ELOCON ) 0.1 % ointment Apply topically twice weekly Patient not taking: Reported on 05/21/2023 08/29/21   Cleotilde Ronal RAMAN, MD  olmesartan (BENICAR) 40 MG tablet Take 40 mg by mouth at bedtime.  09/02/17   [provider]  ondansetron  (ZOFRAN ) 4 MG tablet Take 4 mg by mouth every 8 (eight) hours as needed for nausea/vomiting. 03/27/21   [provider]  oxyCODONE -acetaminophen  (PERCOCET/ROXICET) 5-325 MG tablet Take 1 tablet by mouth every 4 (four) hours as needed for moderate pain (pain score 4-6) or severe pain (pain score  7-10). 05/28/23   Colon Shove, MD  pantoprazole  (PROTONIX ) 40 MG tablet Take 40 mg by mouth daily. 02/12/22   [provider]  PROAIR  HFA 108 (90 BASE) MCG/ACT inhaler Inhale 2 puffs into the lungs every 6 (six) hours as needed for wheezing or shortness of breath.  06/16/13   [provider]  rosuvastatin  (CRESTOR ) 20 MG tablet Take 1 tablet (20 mg total) by mouth daily. 08/06/23   Okey Vina GAILS, MD  triamcinolone  (NASACORT ) 55 MCG/ACT AERO nasal inhaler Place 1 spray into the nose daily as needed (for allergies).    [provider]    Allergies:  Atorvastatin, Codeine, Morphine, Morphine and codeine, and Sulfa antibiotics    Review of Systems  Updated Vital Signs BP 135/82 (BP Location: Right Arm)   Pulse (!) 108   Temp 98.5 F (36.9 C)   Resp 17   Ht 5' 3 (1.6 m)   Wt 59 kg   LMP 07/29/1990   SpO2 100%   BMI 23.03 kg/m   Physical Exam Vitals and nursing note reviewed.  Constitutional:      General: She is not in acute distress.    Appearance: She is well-developed. She is not ill-appearing.  HENT:     Head: Normocephalic and atraumatic.     Nose: Nose normal.     Mouth/Throat:     Mouth: Mucous membranes are moist.  Eyes:     Extraocular Movements: Extraocular movements intact.     Conjunctiva/sclera: Conjunctivae normal.     Pupils: Pupils are equal, round, and reactive to light.  Cardiovascular:     Rate and Rhythm: Tachycardia present. Rhythm irregular.     Pulses: Normal pulses.     Heart sounds: Normal heart sounds. No murmur heard. Pulmonary:     Effort: Pulmonary effort is normal. No respiratory distress.     Breath sounds: Normal breath sounds.  Abdominal:     General: Abdomen is flat.     Palpations: Abdomen is soft.     Tenderness: There is no abdominal tenderness.  Musculoskeletal:        General: No swelling.     Cervical back: Normal range of motion and neck supple.  Skin:    General: Skin is warm and dry.     Capillary Refill: Capillary refill takes less than 2 seconds.  Neurological:     General: No focal deficit present.     Mental Status: She is alert and oriented to person, place, and time.     Cranial Nerves: No cranial nerve deficit.     Sensory: No sensory deficit.     Motor: No weakness.     Coordination: Coordination normal.  Psychiatric:        Mood and Affect: Mood normal.     (all labs ordered are listed, but only abnormal results are displayed) Labs Reviewed  COMPREHENSIVE METABOLIC PANEL WITH GFR - Abnormal; Notable for the following components:      Result Value    Sodium 131 (*)    Chloride 96 (*)    Glucose, Bld 100 (*)    Total Protein 6.4 (*)    All other components within normal limits  URINALYSIS, ROUTINE W REFLEX MICROSCOPIC - Abnormal; Notable for the following components:   Leukocytes,Ua SMALL (*)    Bacteria, UA RARE (*)    All other components within normal limits  BRAIN NATRIURETIC PEPTIDE  CBC WITH DIFFERENTIAL/PLATELET  MAGNESIUM  TSH  TROPONIN I (HIGH SENSITIVITY)  EKG: EKG Interpretation Date/Time:  Monday March 08 2024 13:36:12 EDT Ventricular Rate:  129 PR Interval:    QRS Duration:  112 QT Interval:  282 QTC Calculation: 413 R Axis:   99  Text Interpretation: Atrial fibrillation with rapid ventricular response Rightward axis Incomplete left bundle branch block  Non-specific ST-t changes  Confirmed by Bernard Drivers ([id number]) on 03/08/2024 2:27:05 PM  Radiology: DG Chest Portable 1 View Result Date: 03/08/2024 CLINICAL DATA:  Palpitations.  Chest pain shortness of breath. EXAM: PORTABLE CHEST 1 VIEW COMPARISON:  None. FINDINGS: The cardiopericardial silhouette is within normal limits for size. The lungs are clear without focal pneumonia, edema, pneumothorax or pleural effusion. No acute bony abnormality. Loop recorder device evident. Telemetry leads overlie the chest. IMPRESSION: No active disease. Electronically Signed   By: Camellia Candle M.D.   On: 03/08/2024 15:45     Procedures   Medications Ordered in the ED  metoprolol  tartrate (LOPRESSOR ) tablet 12.5 mg (has no administration in time range)  apixaban  (ELIQUIS ) tablet 2.5 mg (has no administration in time range)  metoprolol  tartrate (LOPRESSOR ) injection 5 mg (5 mg Intravenous Given 03/08/24 1646)            CHA2DS2-VASc Score: 3                        Medical Decision Making Amount and/or Complexity of Data Reviewed Labs: ordered. Radiology: ordered. ECG/medicine tests: ordered.  Risk Prescription drug management.   Aalayah Riles is here with  palpitations.  Found to be in A-fib with RVR on EKG per minute review interpretation of EKG.  However when I go into the room patient's more rate controlled in the 80s and looks to be back in a sinus rhythm.  She was given IV Lopressor  5 mg and had normal sinus rhythm in the 60s.  Talked with Dr. Lavona with cardiology and will start her on 12.5 mg metoprolol  twice daily and Eliquis .  Her [place] vas score was 3.  She had blood work done that was unremarkable.  No significant leukocytosis anemia or electrolyte abnormality per my review interpretation of the labs.  Troponin normal.  Urinalysis negative for infection.  Chest x-ray negative for infection.  No signs of heart failure.  Thyroid  studies were overall unremarkable.  She tolerated medications well and ultimately patient to be started on metoprolol  and Eliquis  and follow-up with atrial fibrillation clinic.  She was educated about blood thinners.  Overall she is neurologically intact.  Discharged in good condition.  Understands return precautions.  This chart was dictated using voice recognition software.  Despite best efforts to proofread,  errors can occur which can change the documentation meaning.      Final diagnoses:  Atrial fibrillation with RVR [facility])    ED Discharge Orders          Ordered    Amb referral to AFIB Clinic        03/08/24 1544    apixaban  (ELIQUIS ) 2.5 MG TABS tablet  2 times daily        03/08/24 1802    metoprolol  tartrate (LOPRESSOR ) 25 MG tablet  2 times daily        03/08/24 1802               Ruthe Cornet, DO 03/08/24 1802

## 2024-03-09 ENCOUNTER — Telehealth: Payer: Self-pay | Admitting: Internal Medicine

## 2024-03-09 DIAGNOSIS — R002 Palpitations: Secondary | ICD-10-CM

## 2024-03-09 NOTE — Addendum Note (Signed)
 Addended by: VICCI ROXIE CROME on: 03/09/2024 02:11 PM   Modules accepted: Orders

## 2024-03-09 NOTE — Telephone Encounter (Signed)
 Reviewed Dr. Okey' note with patient, she verbalized understanding to continue Eliquis  and metoprolol .   Patient requested it be noted she has discontinued aspirin as advised by ED provider.   Echo ordered, scheduler will contact patient to schedule appt. Patient verbalized understanding and expressed appreciation for follow-up.

## 2024-03-09 NOTE — Telephone Encounter (Signed)
 Patient called  She was in ER yesterday   In afib with RVR  Converted to SR Had been in and out of palpitations for 3 days Keep on Eliquis  and metoprolol  Pt needs to be set up for an echo

## 2024-03-11 ENCOUNTER — Ambulatory Visit (HOSPITAL_COMMUNITY)
Admission: RE | Admit: 2024-03-11 | Discharge: 2024-03-11 | Disposition: A | Discharge status: Home / Self Care | Source: Ambulatory Visit | Attending: Cardiology | Admitting: Cardiology

## 2024-03-11 DIAGNOSIS — I517 Cardiomegaly: Secondary | ICD-10-CM | POA: Insufficient documentation

## 2024-03-11 DIAGNOSIS — R002 Palpitations: Secondary | ICD-10-CM

## 2024-03-11 LAB — ECHOCARDIOGRAM COMPLETE
Area-P 1/2: 2.78 cm2
S' Lateral: 2.5 cm

## 2024-03-14 ENCOUNTER — Ambulatory Visit: Payer: Self-pay | Admitting: Internal Medicine

## 2024-03-15 MED ORDER — FLECAINIDE ACETATE 50 MG PO TABS: 50.0000 mg | ORAL_TABLET | Freq: Two times a day (BID) | ORAL | 2 refills | Status: DC

## 2024-03-15 MED ORDER — METOPROLOL TARTRATE 25 MG PO TABS: 12.5000 mg | ORAL_TABLET | Freq: Two times a day (BID) | ORAL | 2 refills | Status: AC

## 2024-03-17 DIAGNOSIS — Z1231 Encounter for screening mammogram for malignant neoplasm of breast: Secondary | ICD-10-CM | POA: Diagnosis not present

## 2024-03-18 ENCOUNTER — Ambulatory Visit (HOSPITAL_COMMUNITY): Admitting: Internal Medicine

## 2024-03-24 NOTE — Progress Notes (Addendum)
 Cardiology Office Note   Date: 03/26/2024  ID:  Sabrina Parks, DOB 11-24-1940, MRN [id number]  PCP:  Loreli Elsie JONETTA Mickey., MD  Cardiologist:   Vina Gull, MD   F/U of HTN and PAF   History of Present Illness: Sabrina Parks is a 83 y.o. female with a history of HTN, HL  palpitations,PAF, one episode of global amnesia (2013), intermitt LBBB and syncope   Nov 2013 Echo showed mild LVH.  LVEF 65 to 70% with Gr I diastlic dysfunc.   2013  Carotid USN neg  2018 EKG showed new  LBBB  Myoview  was normal  The pt also had  one episode of syncope  Had ILR and is followed by [name]    I saw the pt in Sept 2024  The pt was seen in [facility] ER on 03/08/24 for afib with RVR Pt said she had felt her heart racing intermitt for a few weeks prior to the ER visit In ER she converted to SR on her own      She was place on Eliquis   I recomm she start metoprolol  and flecanide (50 bid)  The pt returns for EKG  Since the ER vist she has had no other episodes of heart racing     SHe did develop some nausea.   She transiently stopped the metoprolol  and flecanide and then resumed each individually   The nausea has not returned Breathing is good   No CP  No dizziness  Current Meds  Medication Sig   acetaminophen  (TYLENOL ) 500 MG tablet Take 500 mg by mouth every 6 (six) hours as needed for moderate pain or headache.   acidophilus (RISAQUAD) CAPS capsule Take by mouth daily.   amitriptyline  (ELAVIL ) 25 MG tablet Take 25 mg by mouth at bedtime.    ascorbic acid (VITAMIN C) 250 MG CHEW Chew 250 mg by mouth daily.   Biotin  1 MG CAPS Take 1 mg by mouth daily.   Cholecalciferol (VITAMIN D ) 50 MCG (2000 UT) tablet Take 2,000 Units by mouth daily.   flecainide  (TAMBOCOR ) 50 MG tablet Take 1 tablet (50 mg total) by mouth 2 (two) times daily.   fluticasone  (FLONASE ) 50 MCG/ACT nasal spray Place 1 spray into both nostrils daily as needed for allergies.   levothyroxine  (SYNTHROID , LEVOTHROID) 50 MCG tablet Take 50 mcg  by mouth daily before breakfast.   LORazepam  (ATIVAN ) 0.5 MG tablet Take 0.5 mg by mouth daily as needed for anxiety.    metoprolol  tartrate (LOPRESSOR ) 25 MG tablet Take 0.5 tablets (12.5 mg total) by mouth 2 (two) times daily. (Patient taking differently: Take 25 mg by mouth 2 (two) times daily.)   olmesartan (BENICAR) 40 MG tablet Take 40 mg by mouth at bedtime.    omeprazole  (PRILOSEC) 40 MG capsule Take 40 mg by mouth every morning.   ondansetron  (ZOFRAN ) 4 MG tablet Take 4 mg by mouth every 8 (eight) hours as needed for nausea/vomiting.   PROAIR  HFA 108 (90 BASE) MCG/ACT inhaler Inhale 2 puffs into the lungs every 6 (six) hours as needed for wheezing or shortness of breath.    rosuvastatin  (CRESTOR ) 20 MG tablet Take 1 tablet (20 mg total) by mouth daily.   [DISCONTINUED] apixaban  (ELIQUIS ) 2.5 MG TABS tablet Take 1 tablet (2.5 mg total) by mouth 2 (two) times daily.   [DISCONTINUED] diazepam  (VALIUM ) 5 MG tablet Take 1 tablet (5 mg total) by mouth every 6 (six) hours as needed for muscle spasms.  Allergies:   Atorvastatin, Codeine, Morphine, Morphine and codeine, and Sulfa antibiotics   Past Medical History:  Diagnosis Date   Alopecia    Anxiety    Arthritis    Cancer (HCC)    skin cancers removed   Complication of anesthesia    DJD (degenerative joint disease), cervical 11/2000   [facility]   GERD (gastroesophageal reflux disease)    Headache    HSV-1 infection    Hyperlipidemia    Hypertension    Hypothyroidism    LBBB (left bundle branch block)    Lichen sclerosus 05/22/2016   biopsy proven   Liver hemangioma 2011   Dr. Luis   PONV (postoperative nausea and vomiting)    Raynaud's disease    Thyroiditis    Transient global amnesia    not fully diag    Past Surgical History:  Procedure Laterality Date   ANTERIOR CERVICAL DECOMP/DISCECTOMY FUSION N/A 05/27/2023   Procedure: ANTERIOR CERVICAL DECOMPRESSION/DISCECTOMY FUSION 3 LEVELS CERVICAL FOUR-CERVICAL FIVE -  CERVICAL FIVE-CERIVCAL SIX -CERVICAL SIX-CERVICAL SEVEN;  Surgeon: Colon Shove, MD;  Location: [facility] OR;  Service: Neurosurgery;  Laterality: N/A;  3C   APPENDECTOMY  1956   CARDIOVASCULAR STRESS TEST  10/29/2006   EF 78%.    CATARACT EXTRACTION Bilateral    CHOLECYSTECTOMY     CYSTOCELE REPAIR N/A 12/29/2018   Procedure: exam under anesthesia;  Surgeon: Gaston Hamilton, MD;  Location: [facility] ORS;  Service: Urology;  Laterality: N/A;   LOOP RECORDER INSERTION N/A 05/01/2017   Procedure: LOOP RECORDER INSERTION;  Surgeon: Waddell Danelle ORN, MD;  Location: [facility] INVASIVE CV LAB;  Service: Cardiovascular;  Laterality: N/A;   LUMBAR LAMINECTOMY/DECOMPRESSION MICRODISCECTOMY N/A 09/25/2020   Procedure: Lumbar two-three Lumbar three-four Lumbar four-five  Laminectomy and foraminotomy;  Surgeon: Colon Shove, MD;  Location[facility] OR;  Service: Neurosurgery;  Laterality: N/A;   ROBOTIC ASSISTED LAPAROSCOPIC SACROCOLPOPEXY N/A 02/25/2019   Procedure: XI ROBOTIC ASSISTED LAPAROSCOPIC SACROCOLPOPEXY;  Surgeon: Cam Morene ORN, MD;  Location: [facility] ORS;  Service: Urology;  Laterality: N/A;   ROTATOR CUFF REPAIR Bilateral    SHOULDER SURGERY     TONSILLECTOMY AND ADENOIDECTOMY  1957   TOTAL ABDOMINAL HYSTERECTOMY W/ BILATERAL SALPINGOOPHORECTOMY  01/1989   US  ECHOCARDIOGRAPHY  10/11/2009   EF 55-60%     Social History:  The patient  reports that she quit smoking about 54 years ago. Her smoking use included cigarettes. She started smoking about 64 years ago. She has never used smokeless tobacco. She reports current alcohol  use of about 3.0 standard drinks of alcohol  per week. She reports that she does not use drugs.   Family History:  The patient's family history includes Cancer in her father; Colon cancer in her father; Stroke in her mother.    ROS:  Please see the history of present illness. All other systems are reviewed and  Negative to the above problem except as noted.    PHYSICAL EXAM: VS:  BP 120/70    Pulse (!) 50   Ht 5' 3.75 (1.619 m)   Wt 132 lb 9.6 oz (60.1 kg)   LMP 07/29/1990   SpO2 97%   BMI 22.94 kg/m    GEN: Well nourished, well developed, in no acute distress  HEENT: normal  Neck: JVP normal  No carotid bruits Cardiac: RRR; no murmur;  No LE edema  Respiratory:  clear to auscultation bilaterally GI: soft, nontender  No hepatomegaly    EKG:  EKG is  done SB 50   First  degree AV block PR 220   LBBB  140 msec (previously 130 msec)  Lipid Panel    Component Value Date/Time   CHOL 224 (H) 03/27/2020 1158   TRIG 152 (H) 03/27/2020 1158   HDL 91 03/27/2020 1158   CHOLHDL 2.5 03/27/2020 1158   LDLCALC 107 (H) 03/27/2020 1158      Wt Readings from Last 3 Encounters:  03/26/24 132 lb 9.6 oz (60.1 kg)  03/08/24 130 lb (59 kg)  05/27/23 133 lb 9.6 oz (60.6 kg)      ASSESSMENT AND PLAN:  1  PAF Pt recently diagnosed with atrial fibrillation   In and out probably for several weeks prior    Now on flecanide and Toprol    Had some nausea early on but not now EKG  Known LBBB   QRS is slightly wider  Recomm:  Keep on  current regimen   Will have patient come back for flecanide trough level Will also review with her her activity levels   COnsider Modifed Bruce GXT  2  Hx HTN   BP is well controlled   3   HTN   BP is well controlled    4  Syncope  Pt does have a intermtt  LBBB  Had only one syncopal spell.  ILR placed and no further events    ILF battery is dead  5   LBBB    Normal myoview  in 2019  6 LIpids    LDL 108  HDL 72  Trig 80      Current medicines are reviewed at length with the patient today.  The patient does not have concerns regarding medicines.  Signed, Vina Gull, MD

## 2024-03-26 ENCOUNTER — Ambulatory Visit: Attending: Internal Medicine | Admitting: Internal Medicine

## 2024-03-26 ENCOUNTER — Encounter: Payer: Self-pay | Admitting: Internal Medicine

## 2024-03-26 VITALS — BP 120/70 | HR 50 | Ht 63.75 in | Wt 132.6 lb

## 2024-03-26 DIAGNOSIS — I447 Left bundle-branch block, unspecified: Secondary | ICD-10-CM

## 2024-03-26 DIAGNOSIS — I10 Essential (primary) hypertension: Secondary | ICD-10-CM

## 2024-03-26 DIAGNOSIS — G459 Transient cerebral ischemic attack, unspecified: Secondary | ICD-10-CM | POA: Diagnosis not present

## 2024-03-26 MED ORDER — APIXABAN 2.5 MG PO TABS: 2.5000 mg | ORAL_TABLET | Freq: Two times a day (BID) | ORAL | 1 refills | Status: DC

## 2024-03-26 NOTE — Patient Instructions (Signed)
 Medication Instructions:  No changes *If you need a refill on your cardiac medications before your next appointment, please call your pharmacy*  Lab Work: none If you have labs (blood work) drawn today and your tests are completely normal, you will receive your results only by: MyChart Message (if you have MyChart) OR A paper copy in the mail If you have any lab test that is abnormal or we need to change your treatment, we will call you to review the results.  Testing/Procedures: none  Follow-Up: At [facility], you and your health needs are our priority.  As part of our continuing mission to provide you with exceptional heart care, our providers are all part of one team.  This team includes your primary Cardiologist (physician) and Advanced Practice Providers or APPs (Physician Assistants and Nurse Practitioners) who all work together to provide you with the care you need, when you need it.  Your next appointment:   6 month(s)  Provider:   Vina Gull, MD

## 2024-03-30 DIAGNOSIS — S81812A Laceration without foreign body, left lower leg, initial encounter: Secondary | ICD-10-CM | POA: Diagnosis not present

## 2024-03-30 DIAGNOSIS — D692 Other nonthrombocytopenic purpura: Secondary | ICD-10-CM | POA: Diagnosis not present

## 2024-03-30 DIAGNOSIS — I480 Paroxysmal atrial fibrillation: Secondary | ICD-10-CM | POA: Diagnosis not present

## 2024-03-30 DIAGNOSIS — W19XXXA Unspecified fall, initial encounter: Secondary | ICD-10-CM | POA: Diagnosis not present

## 2024-03-31 ENCOUNTER — Telehealth: Payer: Self-pay

## 2024-03-31 DIAGNOSIS — I447 Left bundle-branch block, unspecified: Secondary | ICD-10-CM

## 2024-03-31 DIAGNOSIS — R002 Palpitations: Secondary | ICD-10-CM

## 2024-03-31 DIAGNOSIS — Z79899 Other long term (current) drug therapy: Secondary | ICD-10-CM

## 2024-03-31 NOTE — Telephone Encounter (Signed)
 Spoke with patient and discussed message from Dr. Okey regarding having flecainide  trough level drawn.  Patient states she will come by lab tomorrow morning to have drawn, she verbalized understanding to not take flecainide  AM dose until after her blood is drawn.  Flecainide  level ordered and released to [facility].

## 2024-03-31 NOTE — Telephone Encounter (Signed)
-----   Message from [place] sent at 03/28/2024 10:25 PM EDT ----- Pt should come in one morning for a flecanide trough level    Get labs before taking am dose

## 2024-04-02 DIAGNOSIS — R002 Palpitations: Secondary | ICD-10-CM | POA: Diagnosis not present

## 2024-04-02 DIAGNOSIS — Z79899 Other long term (current) drug therapy: Secondary | ICD-10-CM | POA: Diagnosis not present

## 2024-04-02 DIAGNOSIS — I447 Left bundle-branch block, unspecified: Secondary | ICD-10-CM | POA: Diagnosis not present

## 2024-04-08 LAB — FLECAINIDE LEVEL: Flecainide: 0.44 ug/mL (ref 0.20–1.00)

## 2024-04-13 ENCOUNTER — Ambulatory Visit: Payer: Self-pay | Admitting: Internal Medicine

## 2024-04-16 DIAGNOSIS — N8111 Cystocele, midline: Secondary | ICD-10-CM | POA: Diagnosis not present

## 2024-04-16 DIAGNOSIS — N3000 Acute cystitis without hematuria: Secondary | ICD-10-CM | POA: Diagnosis not present

## 2024-04-16 DIAGNOSIS — R3914 Feeling of incomplete bladder emptying: Secondary | ICD-10-CM | POA: Diagnosis not present

## 2024-04-19 DIAGNOSIS — I480 Paroxysmal atrial fibrillation: Secondary | ICD-10-CM | POA: Diagnosis not present

## 2024-04-19 DIAGNOSIS — L03116 Cellulitis of left lower limb: Secondary | ICD-10-CM | POA: Diagnosis not present

## 2024-04-19 DIAGNOSIS — D692 Other nonthrombocytopenic purpura: Secondary | ICD-10-CM | POA: Diagnosis not present

## 2024-04-19 DIAGNOSIS — S81812D Laceration without foreign body, left lower leg, subsequent encounter: Secondary | ICD-10-CM | POA: Diagnosis not present

## 2024-04-29 ENCOUNTER — Encounter: Payer: Self-pay | Admitting: Internal Medicine

## 2024-04-29 ENCOUNTER — Ambulatory Visit: Attending: Internal Medicine | Admitting: Internal Medicine

## 2024-04-29 ENCOUNTER — Ambulatory Visit

## 2024-04-29 VITALS — BP 122/60 | HR 54 | Ht 63.5 in | Wt 132.0 lb

## 2024-04-29 DIAGNOSIS — R55 Syncope and collapse: Secondary | ICD-10-CM | POA: Diagnosis not present

## 2024-04-29 DIAGNOSIS — I447 Left bundle-branch block, unspecified: Secondary | ICD-10-CM

## 2024-04-29 DIAGNOSIS — I10 Essential (primary) hypertension: Secondary | ICD-10-CM | POA: Diagnosis not present

## 2024-04-29 NOTE — Progress Notes (Unsigned)
 Enrolled for [employer] to mail a ZIO XT long term holter monitor to the patients address on file.

## 2024-04-29 NOTE — H&P (View-Only) (Signed)
 Cardiology Office Note   Date: 04/29/2024  ID:  Sabrina Parks, DOB [birth year], MRN [id number]  PCP:  Sabrina Elsie JONETTA Mickey., MD  Cardiologist:   Vina Gull, MD   F/U of HTN and PAF   History of Present Illness: Sabrina Parks is a 83 y.o. female with a history of HTN, HL  palpitations,PAF, one episode of global amnesia (2013), intermitt LBBB and syncope   Nov 2013 Echo showed mild LVH.  LVEF 65 to 70% with Gr I diastlic dysfunc.   2013  Carotid USN neg  2018 EKG showed new  LBBB  Myoview  was normal  The pt also had  one episode of syncope  Had ILR and is followed by Sabrina Parks    Aug 2025  Pt presented to [facility]  with intermitt heart racing for a few weeks   Found to be in afib with RVR  COnverted to SR on her own Placed on Eliquis     I recomm starting metoprolol  and flecanide (50 mg bid)   Returned for EKG in late Aug    At that visit noted transient nausea  Stopped meds transiently then resumed  No more nausea  FLecanide trough done was 0.44   Since she was last seen she says that the day after her visit she was watering boxwood plants   She leaned to water  them   Next thing she was on ground with head in bush.   Denied dizziness   No CP  No SOB   No palpitations  Since then she has had no further spells No palpitations Breathing is good  No dizziness   Current Meds  Medication Sig   acetaminophen  (TYLENOL ) 500 MG tablet Take 500 mg by mouth every 6 (six) hours as needed for moderate pain or headache.   amitriptyline  (ELAVIL ) 25 MG tablet Take 25 mg by mouth at bedtime.    apixaban  (ELIQUIS ) 2.5 MG TABS tablet Take 1 tablet (2.5 mg total) by mouth 2 (two) times daily.   ascorbic acid (VITAMIN C) 250 MG CHEW Chew 250 mg by mouth daily.   Biotin  1 MG CAPS Take 1 mg by mouth daily.   Cholecalciferol (VITAMIN D ) 50 MCG (2000 UT) tablet Take 2,000 Units by mouth daily.   flecainide  (TAMBOCOR ) 50 MG tablet Take 1 tablet (50 mg total) by mouth 2 (two) times daily.   fluticasone  (FLONASE )  50 MCG/ACT nasal spray Place 1 spray into both nostrils daily as needed for allergies.   levothyroxine  (SYNTHROID , LEVOTHROID) 50 MCG tablet Take 50 mcg by mouth daily before breakfast.   LORazepam  (ATIVAN ) 0.5 MG tablet Take 0.5 mg by mouth daily as needed for anxiety.    metoprolol  tartrate (LOPRESSOR ) 25 MG tablet Take 0.5 tablets (12.5 mg total) by mouth 2 (two) times daily. (Patient taking differently: Take 25 mg by mouth 2 (two) times daily.)   olmesartan (BENICAR) 40 MG tablet Take 40 mg by mouth at bedtime.    omeprazole  (PRILOSEC) 40 MG capsule Take 40 mg by mouth every morning.   PROAIR  HFA 108 (90 BASE) MCG/ACT inhaler Inhale 2 puffs into the lungs every 6 (six) hours as needed for wheezing or shortness of breath.    rosuvastatin  (CRESTOR ) 20 MG tablet Take 1 tablet (20 mg total) by mouth daily.     Allergies:   Atorvastatin, Codeine, Morphine, Morphine and codeine, and Sulfa antibiotics   Past Medical History:  Diagnosis Date   Alopecia    Anxiety    Arthritis  Cancer (HCC)    skin cancers removed   Complication of anesthesia    DJD (degenerative joint disease), cervical 11/2000   [facility]   GERD (gastroesophageal reflux disease)    Headache    HSV-1 infection    Hyperlipidemia    Hypertension    Hypothyroidism    LBBB (left bundle branch block)    Lichen sclerosus 05/22/2016   biopsy proven   Liver hemangioma 2011   Dr. Luis   PONV (postoperative nausea and vomiting)    Raynaud's disease    Thyroiditis    Transient global amnesia    not fully diag    Past Surgical History:  Procedure Laterality Date   ANTERIOR CERVICAL DECOMP/DISCECTOMY FUSION N/A 05/27/2023   Procedure: ANTERIOR CERVICAL DECOMPRESSION/DISCECTOMY FUSION 3 LEVELS CERVICAL FOUR-CERVICAL FIVE - CERVICAL FIVE-CERIVCAL SIX -CERVICAL SIX-CERVICAL SEVEN;  Surgeon: Colon Shove, MD;  Location: [facility] OR;  Service: Neurosurgery;  Laterality: N/A;  3C   APPENDECTOMY  1956   CARDIOVASCULAR STRESS TEST   10/29/2006   EF 78%.    CATARACT EXTRACTION Bilateral    CHOLECYSTECTOMY     CYSTOCELE REPAIR N/A 12/29/2018   Procedure: exam under anesthesia;  Surgeon: Gaston Hamilton, MD;  Location: [facility];  Service: Urology;  Laterality: N/A;   LOOP RECORDER INSERTION N/A 05/01/2017   Procedure: LOOP RECORDER INSERTION;  Surgeon: Waddell Danelle ORN, MD;  Location: [facility];  Service: Cardiovascular;  Laterality: N/A;   LUMBAR LAMINECTOMY/DECOMPRESSION MICRODISCECTOMY N/A 09/25/2020   Procedure: Lumbar two-three Lumbar three-four Lumbar four-five  Laminectomy and foraminotomy;  Surgeon: Colon Shove, MD;  Location: [facility] OR;  Service: Neurosurgery;  Laterality: N/A;   ROBOTIC ASSISTED LAPAROSCOPIC SACROCOLPOPEXY N/A 02/25/2019   Procedure: XI ROBOTIC ASSISTED LAPAROSCOPIC SACROCOLPOPEXY;  Surgeon: Cam Morene ORN, MD;  Location: [facility];  Service: Urology;  Laterality: N/A;   ROTATOR CUFF REPAIR Bilateral    SHOULDER SURGERY     TONSILLECTOMY AND ADENOIDECTOMY  1957   TOTAL ABDOMINAL HYSTERECTOMY W/ BILATERAL SALPINGOOPHORECTOMY  01/1989   US  ECHOCARDIOGRAPHY  10/11/2009   EF 55-60%     Social History:  The patient  reports that she quit smoking about 54 years ago. Her smoking use included cigarettes. She started smoking about 64 years ago. She has never used smokeless tobacco. She reports current alcohol  use of about 3.0 standard drinks of alcohol  per week. She reports that she does not use drugs.   Family History:  The patient's family history includes Cancer in her father; Colon cancer in her father; Stroke in her mother.    ROS:  Please see the history of present illness. All other systems are reviewed and  Negative to the above problem except as noted.    PHYSICAL EXAM: VS:  BP 122/60   Pulse (!) 54   Ht 5' 3.5 (1.613 m)   Wt 132 lb (59.9 kg)   LMP 07/29/1990   SpO2 93%   BMI 23.02 kg/m    Orthostatics BP  137/73  P 54  Sitting   123/73  P 54   Standing   127/72  P 57    Standing 4 min   136/74  P 56    GEN: Well nourished, well developed, in no acute distress  HEENT: normal  Neck: JVP normal  No carotid bruits Cardiac: RRR; no murmur;  No LE edema  Respiratory:  clear to auscultation bilaterally GI: soft, nontender  No hepatomegaly    EKG:  EKG is  done SB 54  First degree AV block PR 242   LBBB  144 msec (  Lipid Panel    Component Value Date/Time   CHOL 224 (H) 03/27/2020 1158   TRIG 152 (H) 03/27/2020 1158   HDL 91 03/27/2020 1158   CHOLHDL 2.5 03/27/2020 1158   LDLCALC 107 (H) 03/27/2020 1158      Wt Readings from Last 3 Encounters:  04/29/24 132 lb (59.9 kg)  03/26/24 132 lb 9.6 oz (60.1 kg)  03/08/24 130 lb (59 kg)      ASSESSMENT AND PLAN:  1 Syncope   Pt has had syncope in past   Has intermitt LBBB    Had ILR placed in past   No events    Battery dead Now with another syncopal spell    On Flecanide   No prodrome  Will review with EP   For now will set pt  up for event monitor With no cause, recomm no driving for 6 months unless cause found  2  PAF  Pt with intermitt atrial fibrillation which started this summer   She did not tolerate it    Currently on 12.5 mg metoprolol  bid and flecanide 50 mg bid   EKG with very prolongation QRS (130 mse to 144 msec)    Keep on current regimen for now   Await results from monitor   2  Hx HTN   BP is well controlled Not orthostatics   5   LBBB    Normal myoview  in 2019  Follow on monitor    6 LIpids    LDL 108  HDL 72  Trig 80  (July 2025)    Current medicines are reviewed at length with the patient today.  The patient does not have concerns regarding medicines.  Signed, Vina Gull, MD

## 2024-04-29 NOTE — Patient Instructions (Addendum)
 Medication Instructions:  Your physician recommends that you continue on your current medications as directed. Please refer to the Current Medication list given to you today.  *If you need a refill on your cardiac medications before your next appointment, please call your pharmacy*  Testing/Procedures: Your physician has recommended that you wear an event monitor for 30 days. Event monitors are medical devices that record the heart's electrical activity. Doctors most often us  these monitors to diagnose arrhythmias. Arrhythmias are problems with the speed or rhythm of the heartbeat. The monitor is a small, portable device. You can wear one while you do your normal daily activities. This is usually used to diagnose what is causing palpitations/syncope (passing out).    Follow-Up: At [facility], you and your health needs are our priority.  As part of our continuing mission to provide you with exceptional heart care, our providers are all part of one team.  This team includes your primary Cardiologist (physician) and Advanced Practice Providers or APPs (Physician Assistants and Nurse Practitioners) who all work together to provide you with the care you need, when you need it.  Your next appointment:   Will be determined after monitor results.  Provider:   Vina Gull, MD  We recommend signing up for the patient portal called MyChart.  Sign up information is provided on this After Visit Summary.  MyChart is used to connect with patients for Virtual Visits (Telemedicine).  Patients are able to view lab/test results, encounter notes, upcoming appointments, etc.  Non-urgent messages can be sent to your provider as well.    To learn more about what you can do with MyChart, go to [email or web address].   Other Instructions [employer] Cardiac Event Monitor Instructions  Your physician has requested you wear your cardiac event monitor for 30 days, (1-30). [employer] may call or text to  confirm a shipping address. The monitor will be sent to a land address via UPS. [employer] will not ship a monitor to a PO BOX. It typically takes 3-5 days to receive your monitor after it has been enrolled. [employer] will assist with USPS tracking if your package is delayed. The telephone number for [employer] is [phone]. Once you have received your monitor, please review the enclosed instructions. Instruction tutorials can also be viewed under help and settings on the enclosed cell phone. Your monitor has already been registered assigning a specific monitor serial # to you.  Billing and Self Pay Discount Information  [employer] has been provided the insurance information we had on file for you.  If your insurance has been updated, please call [employer] at [phone] to provide them with your updated insurance information.   [employer] offers a discounted Self Pay option for patients who have insurance that does not cover their cardiac event monitor or patients without insurance.  The discounted cost of a Self Pay Cardiac Event Monitor would be $225.00 , if the patient contacts [employer] at [phone] within 7 days of applying the monitor to make payment arrangements.  If the patient does not contact [employer] within 7 days of applying the monitor, the cost of the cardiac event monitor will be $350.00.  Applying the monitor  Remove cell phone from case and turn it on. The cell phone works as IT consultant and needs to be within [employer] of you at all times. The cell phone will need to be charged on a daily basis. We recommend you plug the cell phone into the enclosed charger at your bedside table every  night.  Monitor batteries: You will receive two monitor batteries labelled #1 and #2. These are your recorders. Plug battery #2 onto the second connection on the enclosed charger. Keep one battery on the charger at all times. This will keep the monitor battery deactivated. It  will also keep it fully charged for when you need to switch your monitor batteries. A small light will be blinking on the battery emblem when it is charging. The light on the battery emblem will remain on when the battery is fully charged.  Open package of a Monitor strip. Insert battery #1 into black hood on strip and gently squeeze monitor battery onto connection as indicated in instruction booklet. Set aside while preparing skin.  Choose location for your strip, vertical or horizontal, as indicated in the instruction booklet. Shave to remove all hair from location. There cannot be any lotions, oils, powders, or colognes on skin where monitor is to be applied. Wipe skin clean with enclosed Saline wipe. Dry skin completely.  Peel paper labeled #1 off the back of the Monitor strip exposing the adhesive. Place the monitor on the chest in the vertical or horizontal position shown in the instruction booklet. One arrow on the monitor strip must be pointing upward. Carefully remove paper labeled #2, attaching remainder of strip to your skin. Try not to create any folds or wrinkles in the strip as you apply it.  Firmly press and release the circle in the center of the monitor battery. You will hear a small beep. This is turning the monitor battery on. The heart emblem on the monitor battery will light up every 5 seconds if the monitor battery in turned on and connected to the patient securely. Do not push and hold the circle down as this turns the monitor battery off. The cell phone will locate the monitor battery. A screen will appear on the cell phone checking the connection of your monitor strip. This may read poor connection initially but change to good connection within the next minute. Once your monitor accepts the connection you will hear a series of 3 beeps followed by a climbing crescendo of beeps. A screen will appear on the cell phone showing the two monitor strip placement options. Touch  the picture that demonstrates where you applied the monitor strip.  Your monitor strip and battery are waterproof. You are able to shower, bathe, or swim with the monitor on. They just ask you do not submerge deeper than 3 feet underwater. We recommend removing the monitor if you are swimming in a lake, river, or ocean.  Your monitor battery will need to be switched to a fully charged monitor battery approximately once a week. The cell phone will alert you of an action which needs to be made.  On the cell phone, tap for details to reveal connection status, monitor battery status, and cell phone battery status. The green dots indicates your monitor is in good status. A red dot indicates there is something that needs your attention.  To record a symptom, click the circle on the monitor battery. In 30-60 seconds a list of symptoms will appear on the cell phone. Select your symptom and tap save. Your monitor will record a sustained or significant arrhythmia regardless of you clicking the button. Some patients do not feel the heart rhythm irregularities. [employer] will notify us  of any serious or critical events.  Refer to instruction booklet for instructions on switching batteries, changing strips, the Do not disturb or Pause features,  or any additional questions.  Call [employer] at [phone], to confirm your monitor is transmitting and record your baseline. They will answer any questions you may have regarding the monitor instructions at that time.  Returning the monitor to [employer]  Place all equipment back into blue box. Peel off strip of paper to expose adhesive and close box securely. There is a prepaid UPS shipping label on this box. Drop in a UPS drop box, or at a UPS facility like [employer]. You may also contact [employer] to arrange UPS to pick up monitor package at your home.

## 2024-04-29 NOTE — Progress Notes (Addendum)
 Cardiology Office Note   Date: 04/29/2024  ID:  Sabrina Parks, DOB [birth year], MRN [id number]  PCP:  Loreli Elsie JONETTA Mickey., MD  Cardiologist:   Vina Gull, MD   F/U of HTN and PAF   History of Present Illness: Sabrina Parks is a 83 y.o. female with a history of HTN, HL  palpitations,PAF, one episode of global amnesia (2013), intermitt LBBB and syncope   Nov 2013 Echo showed mild LVH.  LVEF 65 to 70% with Gr I diastlic dysfunc.   2013  Carotid USN neg  2018 EKG showed new  LBBB  Myoview  was normal  The pt also had  one episode of syncope  Had ILR and is followed by [name]    Aug 2025  Pt presented to EF  with intermitt heart racing for a few weeks   Found to be in afib with RVR  COnverted to SR on her own Placed on Eliquis     I recomm starting metoprolol  and flecanide (50 mg bid)   Returned for EKG in late Aug    At that visit noted transient nausea  Stopped meds transiently then resumed  No more nausea  FLecanide trough done was 0.44   Since she was last seen she says that the day after her visit she was watering [place] plants   She leaned to water  them   Next thing she was on ground with head in bush.   Denied dizziness   No CP  No SOB   No palpitations  Since then she has had no further spells No palpitations Breathing is good  No dizziness   Current Meds  Medication Sig   acetaminophen  (TYLENOL ) 500 MG tablet Take 500 mg by mouth every 6 (six) hours as needed for moderate pain or headache.   amitriptyline  (ELAVIL ) 25 MG tablet Take 25 mg by mouth at bedtime.    apixaban  (ELIQUIS ) 2.5 MG TABS tablet Take 1 tablet (2.5 mg total) by mouth 2 (two) times daily.   ascorbic acid (VITAMIN C) 250 MG CHEW Chew 250 mg by mouth daily.   Biotin  1 MG CAPS Take 1 mg by mouth daily.   Cholecalciferol (VITAMIN D ) 50 MCG (2000 UT) tablet Take 2,000 Units by mouth daily.   flecainide  (TAMBOCOR ) 50 MG tablet Take 1 tablet (50 mg total) by mouth 2 (two) times daily.   fluticasone  (FLONASE )  50 MCG/ACT nasal spray Place 1 spray into both nostrils daily as needed for allergies.   levothyroxine  (SYNTHROID , LEVOTHROID) 50 MCG tablet Take 50 mcg by mouth daily before breakfast.   LORazepam  (ATIVAN ) 0.5 MG tablet Take 0.5 mg by mouth daily as needed for anxiety.    metoprolol  tartrate (LOPRESSOR ) 25 MG tablet Take 0.5 tablets (12.5 mg total) by mouth 2 (two) times daily. (Patient taking differently: Take 25 mg by mouth 2 (two) times daily.)   olmesartan (BENICAR) 40 MG tablet Take 40 mg by mouth at bedtime.    omeprazole  (PRILOSEC) 40 MG capsule Take 40 mg by mouth every morning.   PROAIR  HFA 108 (90 BASE) MCG/ACT inhaler Inhale 2 puffs into the lungs every 6 (six) hours as needed for wheezing or shortness of breath.    rosuvastatin  (CRESTOR ) 20 MG tablet Take 1 tablet (20 mg total) by mouth daily.     Allergies:   Atorvastatin, Codeine, Morphine, Morphine and codeine, and Sulfa antibiotics   Past Medical History:  Diagnosis Date   Alopecia    Anxiety    Arthritis  Cancer (HCC)    skin cancers removed   Complication of anesthesia    DJD (degenerative joint disease), cervical 11/2000   [facility]ner   GERD (gastroesophageal reflux disease)    Headache    HSV-1 infection    Hyperlipidemia    Hypertension    Hypothyroidism    LBBB (left bundle branch block)    Lichen sclerosus 05/22/2016   biopsy proven   Liver hemangioma 2011   Dr. Luis   PONV (postoperative nausea and vomiting)    Raynaud's disease    Thyroiditis    Transient global amnesia    not fully diag    Past Surgical History:  Procedure Laterality Date   ANTERIOR CERVICAL DECOMP/DISCECTOMY FUSION N/A 05/27/2023   Procedure: ANTERIOR CERVICAL DECOMPRESSION/DISCECTOMY FUSION 3 LEVELS CERVICAL FOUR-CERVICAL FIVE - CERVICAL FIVE-CERIVCAL SIX -CERVICAL SIX-CERVICAL SEVEN;  Surgeon: Colon Shove, MD;  Location: [facility] OR;  Service: Neurosurgery;  Laterality: N/A;  3C   APPENDECTOMY  1956   CARDIOVASCULAR STRESS TEST   10/29/2006   EF 78%.    CATARACT EXTRACTION Bilateral    CHOLECYSTECTOMY     CYSTOCELE REPAIR N/A 12/29/2018   Procedure: exam under anesthesia;  Surgeon: Gaston Hamilton, MD;  Location: [facility];  Service: Urology;  Laterality: N/A;   LOOP RECORDER INSERTION N/A 05/01/2017   Procedure: LOOP RECORDER INSERTION;  Surgeon: Waddell Danelle ORN, MD;  Location: [facility];  Service: Cardiovascular;  Laterality: N/A;   LUMBAR LAMINECTOMY/DECOMPRESSION MICRODISCECTOMY N/A 09/25/2020   Procedure: Lumbar two-three Lumbar three-four Lumbar four-five  Laminectomy and foraminotomy;  Surgeon: Colon Shove, MD;  Location: [facility] OR;  Service: Neurosurgery;  Laterality: N/A;   ROBOTIC ASSISTED LAPAROSCOPIC SACROCOLPOPEXY N/A 02/25/2019   Procedure: XI ROBOTIC ASSISTED LAPAROSCOPIC SACROCOLPOPEXY;  Surgeon: Cam Morene ORN, MD;  Location: [facility];  Service: Urology;  Laterality: N/A;   ROTATOR CUFF REPAIR Bilateral    SHOULDER SURGERY     TONSILLECTOMY AND ADENOIDECTOMY  1957   TOTAL ABDOMINAL HYSTERECTOMY W/ BILATERAL SALPINGOOPHORECTOMY  01/1989   US  ECHOCARDIOGRAPHY  10/11/2009   EF 55-60%     Social History:  The patient  reports that she quit smoking about 54 years ago. Her smoking use included cigarettes. She started smoking about 64 years ago. She has never used smokeless tobacco. She reports current alcohol  use of about 3.0 standard drinks of alcohol  per week. She reports that she does not use drugs.   Family History:  The patient's family history includes Cancer in her father; Colon cancer in her father; Stroke in her mother.    ROS:  Please see the history of present illness. All other systems are reviewed and  Negative to the above problem except as noted.    PHYSICAL EXAM: VS:  BP 122/60   Pulse (!) 54   Ht 5' 3.5 (1.613 m)   Wt 132 lb (59.9 kg)   LMP 07/29/1990   SpO2 93%   BMI 23.02 kg/m    Orthostatics BP  137/73  P 54  Sitting   123/73  P 54   Standing   127/72  P 57    Standing 4 min   136/74  P 56    GEN: Well nourished, well developed, in no acute distress  HEENT: normal  Neck: JVP normal  No carotid bruits Cardiac: RRR; no murmur;  No LE edema  Respiratory:  clear to auscultation bilaterally GI: soft, nontender  No hepatomegaly    EKG:  EKG is  done SB 54  First degree AV block PR 242   LBBB  144 msec (  Lipid Panel    Component Value Date/Time   CHOL 224 (H) 03/27/2020 1158   TRIG 152 (H) 03/27/2020 1158   HDL 91 03/27/2020 1158   CHOLHDL 2.5 03/27/2020 1158   LDLCALC 107 (H) 03/27/2020 1158      Wt Readings from Last 3 Encounters:  04/29/24 132 lb (59.9 kg)  03/26/24 132 lb 9.6 oz (60.1 kg)  03/08/24 130 lb (59 kg)      ASSESSMENT AND PLAN:  1 Syncope   Pt has had syncope in past   Has intermitt LBBB    Had ILR placed in past   No events    Battery dead Now with another syncopal spell    On Flecanide   No prodrome  Will review with EP   For now will set pt  up for event monitor With no cause, recomm no driving for 6 months unless cause found  2  PAF  Pt with intermitt atrial fibrillation which started this summer   She did not tolerate it    Currently on 12.5 mg metoprolol  bid and flecanide 50 mg bid   EKG with very prolongation QRS (130 mse to 144 msec)    Keep on current regimen for now   Await results from monitor   2  Hx HTN   BP is well controlled Not orthostatics   5   LBBB    Normal myoview  in 2019  Follow on monitor    6 LIpids    LDL 108  HDL 72  Trig 80  (July 2025)    Current medicines are reviewed at length with the patient today.  The patient does not have concerns regarding medicines.  Signed, Vina Gull, MD

## 2024-05-01 DIAGNOSIS — Z23 Encounter for immunization: Secondary | ICD-10-CM | POA: Diagnosis not present

## 2024-05-21 ENCOUNTER — Encounter: Payer: Self-pay | Admitting: Neurological Surgery

## 2024-05-24 ENCOUNTER — Telehealth: Payer: Self-pay | Admitting: Student

## 2024-05-24 NOTE — Telephone Encounter (Signed)
   Received page from outpatient monitoring company about critical EKG.  Called and spoke with rep who stated patient had an episode of complete heart block with heart rates 31 bpm at 3:54 PM EST.  This was an automatic trigger and it lasted for the entirety of the 1 minute recording so unclear how long it actually first lasted for.  I did request a copy this be faxed to us  and it does look like complete heart block.  Monitor company is unable to tell me what rhythm she is in currently.  I called and spoke with patient.  She states she is feeling fine and is about to eat dinner.  She does states she had some very mild and vague symptoms earlier this afternoon but unclear if this is at the time of the event.  Monitor was ordered for syncope.  Therefore, I think the safest plan is for her to come to the ED for further evaluation.  She is on metoprolol  and flecainide  and I recommended her stop both of these.  I suspect she may need to be monitored while these watch out of her system.  She continues to have high-grade AV block she may need a pacemaker.  Patient voiced understanding and states she will come to the ED.  Instructed patient not to drive herself.    Will route phone note to Dr. Okey so she is aware.   Suriah Peragine E Henriette Hesser, PA-C 05/24/2024 6:37 PM

## 2024-05-25 ENCOUNTER — Telehealth: Payer: Self-pay

## 2024-05-25 NOTE — Telephone Encounter (Signed)
 Spoke to pt last evening and today    She dinies dizzines Told her to stop metoprolol  and flecanide Scheduled for PPM on Thursday     Hold Eliquis  today    Continue with monitor

## 2024-05-25 NOTE — Telephone Encounter (Signed)
 Called pt to go over PPM Implant Instructions - She is scheduled 10/30 at 1:30 pm with Dr. Almetta.  Dr. Okey informed her that she could get labs when she got there that day. I will put orders in for them to be done STAT.   Per Dr. Almetta she will hold Eliquis  x 2 days.  She has surgical scrub at home and was instructed on how to use it.   I gave her my direct number to call if she has any questions or concerns.   She is aware of exactly where to go and what time to be there.   Per Dr. Almetta pt was advised to remove her Event Monitor Wednesday night so she can properly use the surgical scrub prior to her procedure on Thursday.

## 2024-05-26 ENCOUNTER — Encounter: Payer: Self-pay | Admitting: Emergency Medicine

## 2024-05-26 NOTE — Pre-Procedure Instructions (Signed)
 Instructed patient on the following items: Arrival time 1130 Nothing to eat or drink after midnight No meds AM of procedure Responsible person to drive you home and stay with you for 24 hrs Wash with special soap night before and morning of procedure If on anti-coagulant drug instructions Eliquis - last dose 10/27

## 2024-05-27 ENCOUNTER — Ambulatory Visit (HOSPITAL_COMMUNITY)
Admission: RE | Disposition: A | Discharge status: Home / Self Care | Payer: Self-pay | Source: Home / Self Care | Attending: Student in an Organized Health Care Education/Training Program

## 2024-05-27 ENCOUNTER — Other Ambulatory Visit: Payer: Self-pay

## 2024-05-27 ENCOUNTER — Encounter (HOSPITAL_COMMUNITY): Payer: Self-pay | Admitting: Student in an Organized Health Care Education/Training Program

## 2024-05-27 ENCOUNTER — Observation Stay (HOSPITAL_COMMUNITY)
Admission: RE | Admit: 2024-05-27 | Discharge: 2024-05-28 | Disposition: A | Discharge status: Home / Self Care | Attending: Student in an Organized Health Care Education/Training Program | Admitting: Student in an Organized Health Care Education/Training Program

## 2024-05-27 DIAGNOSIS — E039 Hypothyroidism, unspecified: Secondary | ICD-10-CM | POA: Insufficient documentation

## 2024-05-27 DIAGNOSIS — I447 Left bundle-branch block, unspecified: Principal | ICD-10-CM | POA: Insufficient documentation

## 2024-05-27 DIAGNOSIS — Z85828 Personal history of other malignant neoplasm of skin: Secondary | ICD-10-CM | POA: Insufficient documentation

## 2024-05-27 DIAGNOSIS — I442 Atrioventricular block, complete: Secondary | ICD-10-CM | POA: Diagnosis not present

## 2024-05-27 DIAGNOSIS — Z01818 Encounter for other preprocedural examination: Secondary | ICD-10-CM

## 2024-05-27 DIAGNOSIS — I480 Paroxysmal atrial fibrillation: Secondary | ICD-10-CM | POA: Diagnosis not present

## 2024-05-27 DIAGNOSIS — Z87891 Personal history of nicotine dependence: Secondary | ICD-10-CM | POA: Diagnosis not present

## 2024-05-27 DIAGNOSIS — I10 Essential (primary) hypertension: Secondary | ICD-10-CM | POA: Diagnosis not present

## 2024-05-27 DIAGNOSIS — Z79899 Other long term (current) drug therapy: Secondary | ICD-10-CM | POA: Diagnosis not present

## 2024-05-27 HISTORY — PX: PACEMAKER IMPLANT: EP1218

## 2024-05-27 LAB — CBC
HCT: 38.5 % (ref 36.0–46.0)
Hemoglobin: 12.7 g/dL (ref 12.0–15.0)
MCH: 28.4 pg (ref 26.0–34.0)
MCHC: 33 g/dL (ref 30.0–36.0)
MCV: 86.1 fL (ref 80.0–100.0)
Platelets: 288 K/uL (ref 150–400)
RBC: 4.47 MIL/uL (ref 3.87–5.11)
RDW: 13.6 % (ref 11.5–15.5)
WBC: 6.4 K/uL (ref 4.0–10.5)
nRBC: 0 % (ref 0.0–0.2)

## 2024-05-27 LAB — BASIC METABOLIC PANEL WITH GFR
Anion gap: 10 (ref 5–15)
BUN: 9 mg/dL (ref 8–23)
CO2: 26 mmol/L (ref 22–32)
Calcium: 9.9 mg/dL (ref 8.9–10.3)
Chloride: 100 mmol/L (ref 98–111)
Creatinine, Ser: 0.57 mg/dL (ref 0.44–1.00)
GFR, Estimated: >60 mL/min (ref >60)
Glucose, Bld: 102 mg/dL — ABNORMAL HIGH (ref 70–99)
Potassium: 4.2 mmol/L (ref 3.5–5.1)
Sodium: 136 mmol/L (ref 135–145)

## 2024-05-27 SURGERY — PACEMAKER IMPLANT

## 2024-05-27 MED ORDER — MIDAZOLAM HCL 2 MG/2ML IJ SOLN
INTRAMUSCULAR | Status: AC
  Filled 2024-05-27: qty 2

## 2024-05-27 MED ORDER — CEFAZOLIN SODIUM-DEXTROSE 2-4 GM/100ML-% IV SOLN
2.0000 g | INTRAVENOUS | Status: AC
  Administered 2024-05-27: 2 g via INTRAVENOUS

## 2024-05-27 MED ORDER — CHLORHEXIDINE GLUCONATE 4 % EX SOLN
4.0000 | Freq: Once | CUTANEOUS | Status: DC
  Filled 2024-05-27: qty 60

## 2024-05-27 MED ORDER — ONDANSETRON HCL 4 MG/2ML IJ SOLN
INTRAMUSCULAR | Status: AC
  Filled 2024-05-27: qty 2

## 2024-05-27 MED ORDER — LORAZEPAM 0.5 MG PO TABS: 0.5000 mg | ORAL_TABLET | Freq: Every day | ORAL | Status: DC | PRN

## 2024-05-27 MED ORDER — SODIUM CHLORIDE 0.9 % IV SOLN: INTRAVENOUS | Status: DC

## 2024-05-27 MED ORDER — SODIUM CHLORIDE 0.9 % IV SOLN
80.0000 mg | INTRAVENOUS | Status: AC
  Administered 2024-05-27: 80 mg

## 2024-05-27 MED ORDER — HEPARIN (PORCINE) IN NACL 1000-0.9 UT/500ML-% IV SOLN
INTRAVENOUS | Status: DC | PRN
  Administered 2024-05-27: 500 mL

## 2024-05-27 MED ORDER — LEVOTHYROXINE SODIUM 50 MCG PO TABS
50.0000 ug | ORAL_TABLET | Freq: Every day | ORAL | Status: DC
  Administered 2024-05-28: 50 ug via ORAL
  Filled 2024-05-27: qty 1

## 2024-05-27 MED ORDER — ONDANSETRON HCL 4 MG/2ML IJ SOLN
INTRAMUSCULAR | Status: DC | PRN
  Administered 2024-05-27: 4 mg via INTRAVENOUS

## 2024-05-27 MED ORDER — CEFAZOLIN SODIUM-DEXTROSE 2-4 GM/100ML-% IV SOLN
INTRAVENOUS | Status: AC
  Filled 2024-05-27: qty 100

## 2024-05-27 MED ORDER — FENTANYL CITRATE (PF) 100 MCG/2ML IJ SOLN
INTRAMUSCULAR | Status: AC
  Filled 2024-05-27: qty 2

## 2024-05-27 MED ORDER — ROSUVASTATIN CALCIUM 5 MG PO TABS
10.0000 mg | ORAL_TABLET | Freq: Every day | ORAL | Status: DC
Start: 2024-05-27 — End: 2024-05-28
  Administered 2024-05-27: 10 mg via ORAL
  Filled 2024-05-27: qty 2

## 2024-05-27 MED ORDER — FENTANYL CITRATE (PF) 100 MCG/2ML IJ SOLN
INTRAMUSCULAR | Status: DC | PRN
  Administered 2024-05-27 (×2): 25 ug via INTRAVENOUS
  Administered 2024-05-27: 50 ug via INTRAVENOUS

## 2024-05-27 MED ORDER — ACETAMINOPHEN 325 MG PO TABS
325.0000 mg | ORAL_TABLET | ORAL | Status: DC | PRN
  Administered 2024-05-27: 650 mg via ORAL
  Filled 2024-05-27: qty 2

## 2024-05-27 MED ORDER — LIDOCAINE HCL (PF) 1 % IJ SOLN
INTRAMUSCULAR | Status: DC | PRN
  Administered 2024-05-27: 60 mL

## 2024-05-27 MED ORDER — LIDOCAINE HCL (PF) 1 % IJ SOLN
INTRAMUSCULAR | Status: AC
  Filled 2024-05-27: qty 60

## 2024-05-27 MED ORDER — ONDANSETRON HCL 4 MG/2ML IJ SOLN
4.0000 mg | Freq: Four times a day (QID) | INTRAMUSCULAR | Status: DC | PRN
  Administered 2024-05-27: 4 mg via INTRAVENOUS
  Filled 2024-05-27: qty 2

## 2024-05-27 MED ORDER — METOPROLOL TARTRATE 12.5 MG HALF TABLET
12.5000 mg | ORAL_TABLET | Freq: Two times a day (BID) | ORAL | Status: DC
  Filled 2024-05-27: qty 1

## 2024-05-27 MED ORDER — MIDAZOLAM HCL 5 MG/5ML IJ SOLN
INTRAMUSCULAR | Status: DC | PRN
Start: 2024-05-27 — End: 2024-05-27
  Administered 2024-05-27 (×2): 1 mg via INTRAVENOUS

## 2024-05-27 MED ORDER — SODIUM CHLORIDE 0.9 % IV SOLN
INTRAVENOUS | Status: AC
  Filled 2024-05-27: qty 2

## 2024-05-27 MED ORDER — FLECAINIDE ACETATE 50 MG PO TABS
50.0000 mg | ORAL_TABLET | Freq: Two times a day (BID) | ORAL | Status: DC
  Filled 2024-05-27 (×2): qty 1

## 2024-05-27 MED ORDER — POVIDONE-IODINE 10 % EX SWAB: 2.0000 | Freq: Once | CUTANEOUS | Status: DC

## 2024-05-27 MED ORDER — AMITRIPTYLINE HCL 25 MG PO TABS
25.0000 mg | ORAL_TABLET | Freq: Every day | ORAL | Status: DC
  Administered 2024-05-27: 25 mg via ORAL
  Filled 2024-05-27: qty 1

## 2024-05-27 SURGICAL SUPPLY — 10 items
CABLE SURGICAL S-101-97-12 (CABLE) ×2 IMPLANT
CATH RIGHTSITE C315HIS02 (CATHETERS) IMPLANT
IPG PACE AZUR XT DR MRI W1DR01 (Pacemaker) IMPLANT
LEAD CAPSURE NOVUS 5076-52CM (Lead) IMPLANT
LEAD SELECT SECURE 3830 383069 (Lead) IMPLANT
PAD DEFIB RADIO PHYSIO CONN (PAD) ×2 IMPLANT
SHEATH 7FR PRELUDE SNAP 13 (SHEATH) IMPLANT
SLITTER 6232ADJ (MISCELLANEOUS) IMPLANT
TRAY PACEMAKER INSERTION (PACKS) ×1 IMPLANT
WIRE HI TORQ VERSACORE-J 145CM (WIRE) IMPLANT

## 2024-05-27 NOTE — Interval H&P Note (Signed)
 History and Physical Interval Note:  05/27/2024 2:28 PM  Sabrina Parks  has presented today for surgery, with the diagnosis of bradicardia.  The various methods of treatment have been discussed with the patient and family. After consideration of risks, benefits and other options for treatment, the patient has consented to  Procedure(s): PACEMAKER IMPLANT (N/A) as a surgical intervention.  The patient's history has been reviewed, patient examined, no change in status, stable for surgery.  I have reviewed the patient's chart and labs.  Questions were answered to the patient's satisfaction.    Reviewed prior monitor with CHB. Patient with recent syncope. No recurrent events. Known AF. Apixaban  LD Sunday evening. Risk/benefits and alternatives including but not limited to pain related to device implant, infection, bleeding, hematoma, damage to the lung/heart requiring drain and/or surgery, lead perforation or dislodgement were reviewed with her and her husband. Proceed with LEFT DC/LBaP PPM implant. Same day discharge.  Sabrina Parks

## 2024-05-27 NOTE — Op Note (Signed)
 Procedure:  LEFT sided MDT Dual Chamber/LBaP PPM Implant  Pre-op Diagnosis: CHB    Post-op Diagnosis: same  Procedure Date: 05/27/24  Attending: Adina Primus, MD   Anesthesia: MAC  Procedure Report:  The LEFT shoulder was prepped with chlorhexidine  scrub. 40 cc lidocaine  was injected at the planned incision site. A pocket was created with electrocautery and blunt dissection.    No venogram was performed. The LEFT axillary vein was accessed using standard access needle under ultrasound guidance. A wire was passed into the IVC. The access process was repeated once and a second wire was advanced into the IVC.  A 7 Fr sheath was advanced and the dilator removed. Through this short sheath, a C315 fixed curve sheath was advanced to the basal RV septum.   A second 7 Fr short sheath was advanced over the second wire.   The RV lead was inserted into the sheath and the lead was then advanced to the basal RV septum.   The patient had asystole prior to lead placement. Temporary transcutaneous pacing without stable capture. The RA lead was advanced to the RV apical septum with stable capture.   The site for deployment was identified based on RAO and LAO fluoroscopy and response to pacing. With RV endocardial pacing, we observed a W pattern in V1 and aVR/aVL discordance. The body of the 3830 lead was rotated in a clockwise fashion and it was advanced into the RV septum.   The paced morphology demonstrated qR in V1.   The LVAT was < 80 ms as measured  from onset of stim to peak R wave in V6. The above findings were most consistent with selective LB capture  The sheath was split and removed and the RV lead was firmly attached to the pre-pectoralis fascia with three 0-silk sutures.   The RA lead was withdrawn from the RV apex and subsequently advanced over a straight stylet into the inferior RA. A curved stylet was then used to affix the RA lead in the right atrial appendage. The active  fixation screw was deployed, and interrogation revealed a current of injury with acceptable lead parameters (see below). The peel away sheath was removed and the lead was affixed to the fascia using two 0-silk sutures.   The device was then connected to the leads. The pocket was irrigated with gentamicin solution. The pocket was closed with continuous 2-0 V-Loc and 3-0 V-Loc. Dermabond was used to close the superficial layer. A vaso-occlusive dressing was placed.    The device was evaluated by the representative of the cardiac device manufacturer under the supervision of the attending physician with implant parameters listed below.   Complications: none Estimated blood loss: less than 50 mL  Implanted Hardware: Implant Name Type Inv. Item Serial No. Manufacturer Lot No. LRB No. Used Action  LEAD SELECT SECURE 3830 [id number] - [id number] Lead LEAD SELECT SECURE 3830 [id number] [employer]  N/A 1 Implanted  LEAD CAPSURE NOVUS 5076-52CM - [id number] Lead LEAD CAPSURE NOVUS 5076-52CM [id number] [employer]HY[employer]  N/A 1 Implanted  IPG PACE AZUR XT DR MRI T8IM98 - [id number] Pacemaker IPG PACE AZUR XT DR MRI T8IM98 [id number] [employer]  N/A 1 Implanted   Device Information: PPM: MDT Azure XT DR MRI [id number], SN [id number], DOI 05/27/24 RA:  MDT 5076 CapsureFix Novus MRI, SN [id number], DOI 05/27/24 RV/LBaP: MDT 3830 SelectSecure, SN [id number], DOI 05/27/24  Lead Interrogation Data: RA: 3.3 mV / 646 ohms / 2.0 V @ 0.4  ms RV/LBaP: CHB (VP 30) / 665 ohms / 0.5 V @ 0.4 ms  Summary: 1. Successful implant of a LEFT sided MDT DC/LBBAP PPM with selective LB capture  Recommendations: Routine post-procedure care with bedrest for 3 hours No heparin (IV or subcutaneous) for 48 hours. No enoxaparin  (IV or subcutaneous) for 7 days.  Resume apixaban  5 mg bid on Saturday AM (05/29/24) with distant prior TIA PA/lateral CXR in AM Wound check in  AM Device interrogation in AM NPO at midnight  [name], MD [facility]  Cardiac Electrophysiology

## 2024-05-27 NOTE — Discharge Instructions (Addendum)
 After Your Pacemaker   You have a Medtronic Pacemaker  If you have a Medtronic or Biotronik device, plug in your home monitor once you get home, and no manual interaction is required.   If you have an Abbott or [employer] device, plug your home monitor once you get home, sit near the device, and press the large activation button. Sit nearby until the process is complete, usually notated by lights on the monitor.   If you were set up for monitoring using an app on your phone, make sure the app remains open in the background and the Bluetooth remains on.  ACTIVITY Do not lift your arm above shoulder height for 1 week after your procedure. After 7 days, you may progress as below.  You should remove your sling 24 hours after your procedure, unless otherwise instructed by your provider.     Thursday June 03, 2024  Friday June 04, 2024 Saturday June 05, 2024 Sunday June 06, 2024   Do not lift, push, pull, or carry anything over 10 pounds with the affected arm until 6 weeks (Thursday July 08, 2024 ) after your procedure.   You may drive AFTER your wound check, unless you have been told otherwise by your provider.   Ask your healthcare provider when you can go back to work   INCISION/Dressing Resume Eliquis  on Sunday  If large square, outer bandage is left in place, this can be removed after 24 hours from your procedure. Do not remove steri-strips or glue as below.   If a PRESSURE DRESSING (a bulky dressing that usually goes up over your shoulder) was applied or left in place, please follow instructions given by your provider on when to return to have this removed.   Monitor your Pacemaker site for redness, swelling, and drainage. Call the device clinic at [phone] if you experience these symptoms or fever/chills.  If your incision is sealed with Dermabond (glue), you may shower 24 hrs after your procedure or when told by your provider. Do not remove the glue or  let the shower hit directly on your site. You may wash around your site with soap and water .    If you were discharged in a sling, please do not wear this during the day more than 48 hours after your surgery unless otherwise instructed. This may increase the risk of stiffness and soreness in your shoulder.   Avoid lotions, ointments, or perfumes over your incision until it is well-healed.  You may use a hot tub or a pool AFTER your wound check appointment if the incision is completely closed.  Pacemaker Alerts:  Some alerts are vibratory and others beep. These are NOT emergencies. Please call our office to let us  know. If this occurs at night or on weekends, it can wait until the next business day. Send a remote transmission.  If your device is capable of reading fluid status (for heart failure), you will be offered monthly monitoring to review this with you.   DEVICE MANAGEMENT Remote monitoring is used to monitor your pacemaker from home. This monitoring is scheduled every 91 days by our office. It allows us  to keep an eye on the functioning of your device to ensure it is working properly. You will routinely see your Electrophysiologist annually (more often if necessary).  This will appear as a REMOTE check on your MyChart schedule. These are automatic and there is nothing for you to manually do unless otherwise instructed.  You should receive your ID  card for your new device in 4-8 weeks. Keep this card with you at all times once received. Consider wearing a medical alert bracelet or necklace.  Your Pacemaker may be MRI compatible. This will be discussed at your next office visit/wound check.  You should avoid contact with strong electric or magnetic fields.   Do not use amateur (ham) radio equipment or electric (arc) welding torches. MP3 player headphones with magnets should not be used. Some devices are safe to use if held at least 12 inches (30 cm) from your Pacemaker. These include power  tools, lawn mowers, and speakers. If you are unsure if something is safe to use, ask your health care provider.  When using your cell phone, hold it to the ear that is on the opposite side from the Pacemaker. Do not leave your cell phone in a pocket over the Pacemaker.  You may safely use electric blankets, heating pads, computers, and microwave ovens.  Call the office right away if: You have chest pain. You feel more short of breath than you have felt before. You feel more light-headed than you have felt before. Your incision starts to open up.  This information is not intended to replace advice given to you by your health care provider. Make sure you discuss any questions you have with your health care provider.

## 2024-05-28 ENCOUNTER — Encounter (HOSPITAL_COMMUNITY): Payer: Self-pay | Admitting: Student in an Organized Health Care Education/Training Program

## 2024-05-28 ENCOUNTER — Observation Stay (HOSPITAL_COMMUNITY)

## 2024-05-28 ENCOUNTER — Ambulatory Visit: Attending: Internal Medicine

## 2024-05-28 DIAGNOSIS — I480 Paroxysmal atrial fibrillation: Secondary | ICD-10-CM | POA: Diagnosis not present

## 2024-05-28 DIAGNOSIS — I10 Essential (primary) hypertension: Secondary | ICD-10-CM | POA: Diagnosis not present

## 2024-05-28 DIAGNOSIS — I442 Atrioventricular block, complete: Principal | ICD-10-CM

## 2024-05-28 DIAGNOSIS — I447 Left bundle-branch block, unspecified: Secondary | ICD-10-CM | POA: Diagnosis not present

## 2024-05-28 DIAGNOSIS — Z79899 Other long term (current) drug therapy: Secondary | ICD-10-CM | POA: Diagnosis not present

## 2024-05-28 DIAGNOSIS — Z4789 Encounter for other orthopedic aftercare: Secondary | ICD-10-CM | POA: Diagnosis not present

## 2024-05-28 DIAGNOSIS — R55 Syncope and collapse: Secondary | ICD-10-CM

## 2024-05-28 DIAGNOSIS — Z950 Presence of cardiac pacemaker: Secondary | ICD-10-CM | POA: Diagnosis not present

## 2024-05-28 MED ORDER — APIXABAN 2.5 MG PO TABS: 2.5000 mg | ORAL_TABLET | Freq: Two times a day (BID) | ORAL | 1 refills | Status: AC

## 2024-05-28 MED FILL — Midazolam HCl Inj 2 MG/2ML (Base Equivalent): INTRAMUSCULAR | Qty: 2 | Status: AC

## 2024-05-28 NOTE — Discharge Summary (Addendum)
 ELECTROPHYSIOLOGY PROCEDURE DISCHARGE SUMMARY    Patient ID: Sabrina Parks,  MRN: [id number], DOB/AGE: 1940-10-26 83 y.o.  Admit date: 05/27/2024 Discharge date: 05/28/2024  Primary Care Physician: Loreli Elsie JONETTA Mickey., MD  Primary Cardiologist: Vina Gull, MD  Electrophysiologist: Dr. Almetta   Primary Discharge Diagnosis:  Intermittent CHB status post pacemaker implantation this admission  Secondary Discharge Diagnosis:  Paroxysmal AF HTN LBBB  Allergies  Allergen Reactions   Atorvastatin Other (See Comments)    Muscle pain   Codeine Nausea Only   Morphine Hives    Other reaction(s): Hives/Skin Rash, makes her crazy   Morphine And Codeine Hives   Sulfa Antibiotics Rash     Procedures This Admission:  1.  Implantation of a Medtronic Dual Chamber PPM on 05/27/2024 by Dr. Almetta.  Implant Name Type Inv. Item Serial No. Manufacturer Lot No. LRB No. Used Action  LEAD SELECT SECURE 3830 [id number] - [id number] Lead LEAD SELECT SECURE 3830 [id number] [employer]  N/A 1 Implanted  LEAD CAPSURE NOVUS 5076-52CM - [id number] Lead LEAD CAPSURE NOVUS 5076-52CM [id number] [employer]  N/A 1 Implanted  IPG PACE AZUR XT DR MRI T8IM98 - [id number] Pacemaker IPG PACE AZUR XT DR MRI T8IM98 [id number] [employer]  N/A 1 Implanted    There were no immediate post procedure complications.   2.  CXR on 05/28/2024 demonstrated no pneumothorax status post device implantation.       Brief HPI: Sabrina Parks is a 83 y.o. female was referred to electrophysiology in the outpatient setting for  consideration of PPM implantation.  Past medical history includes above.  The patient has had AV block without reversible causes identified.  Risks, benefits, and alternatives to PPM implantation were reviewed with the patient who wished to proceed.   Hospital Course:  The patient was admitted and underwent implantation of a  Medtronic dual chamber PPM with details as outlined above.  She was monitored on telemetry overnight which demonstrated appropriate pacing.  Left chest was without hematoma or ecchymosis.  The device was interrogated and found to be functioning normally.  CXR was obtained and demonstrated no pneumothorax status post device implantation.  Wound care, arm mobility, and restrictions were reviewed with the patient.  The patient was examined and considered stable for discharge to home.    Anticoagulation resumption This patient should resume their Eliquis  on Saturday May 29, 2024    Physical Exam: Vitals:   05/27/24 1843 05/27/24 1953 05/28/24 0025 05/28/24 0405  BP: 111/87 (!) 147/53 (!) 122/56 (!) 131/49  Pulse:   70 67  Resp: 18 19 20 18   Temp: 97.7 F (36.5 C) 97.8 F (36.6 C) 98.5 F (36.9 C) 98.2 F (36.8 C)  TempSrc: Oral Oral Oral Oral  SpO2: 97%  94% 97%  Weight: 59 kg     Height: 5' 3 (1.6 m)       GEN- NAD. A&O x 3.  HEENT: Normocephalic, atraumatic Lungs- CTAB, Normal effort.  Heart- RRR, No M/G/R.  GI- Soft, NT, ND.  Extremities- No clubbing, cyanosis, or edema;  Skin- warm and dry, no rash or lesion, left chest without hematoma/ecchymosis  Discharge Medications:  Allergies as of 05/28/2024       Reactions   Atorvastatin Other (See Comments)   Muscle pain   Codeine Nausea Only   Morphine Hives   Other reaction(s): Hives/Skin Rash, makes her crazy   Morphine And Codeine Hives   Sulfa Antibiotics Rash  Medication List     PAUSE taking these medications    apixaban  2.5 MG Tabs tablet Wait to take this until: May 30, 2024 Commonly known as: ELIQUIS  Take 1 tablet (2.5 mg total) by mouth 2 (two) times daily.       TAKE these medications    acetaminophen  500 MG tablet Commonly known as: TYLENOL  Take 500 mg by mouth every 6 (six) hours as needed for moderate pain or headache.   amitriptyline  25 MG tablet Commonly known as: ELAVIL  Take  25 mg by mouth at bedtime.   Biotin  1 MG Caps Take 1 mg by mouth daily.   CRANBERRY PO Take 2 tablets by mouth daily. Gummie   flecainide  50 MG tablet Commonly known as: TAMBOCOR  Take 1 tablet (50 mg total) by mouth 2 (two) times daily.   fluticasone  50 MCG/ACT nasal spray Commonly known as: FLONASE  Place 1 spray into both nostrils daily as needed for allergies.   levothyroxine  50 MCG tablet Commonly known as: SYNTHROID  Take 50 mcg by mouth daily before breakfast.   LORazepam  0.5 MG tablet Commonly known as: ATIVAN  Take 0.5 mg by mouth daily as needed for anxiety.   metoprolol  tartrate 25 MG tablet Commonly known as: LOPRESSOR  Take 0.5 tablets (12.5 mg total) by mouth 2 (two) times daily.   olmesartan 40 MG tablet Commonly known as: BENICAR Take 40 mg by mouth at bedtime.   omeprazole  40 MG capsule Commonly known as: PRILOSEC Take 40 mg by mouth every morning.   ProAir  HFA 108 (90 Base) MCG/ACT inhaler Generic drug: albuterol  Inhale 2 puffs into the lungs every 6 (six) hours as needed for wheezing or shortness of breath.   rosuvastatin  20 MG tablet Commonly known as: CRESTOR  Take 1 tablet (20 mg total) by mouth daily. What changed: how much to take   Vitamin D  50 MCG (2000 UT) tablet Take 2,000 Units by mouth daily.        Disposition:  Home with usual follow up as in AVS   Duration of Discharge Encounter:  APP time: 24 minutes  Signed, Ozell Prentice Passey, PA-C  05/28/2024 7:25 AM

## 2024-05-28 NOTE — TOC CM/SW Note (Signed)
 Transition of Care [facility]) - Inpatient Brief Assessment   Patient Details  Name: Sabrina Parks MRN: [id number] Date of Birth: September 07, 1940  Transition of Care [facility] CM/SW Contact:    Waddell Barnie Rama, RN Phone Number: 05/28/2024, 9:09 AM   Clinical Narrative: Patient was discharged before NCM saw patient.    Transition of Care Asessment: Insurance and Status: Insurance coverage has been reviewed Patient has primary care physician: Yes Home environment has been reviewed: from home Prior level of function:: ambulatory Prior/Current Home Services: No current home services Social Drivers of Health Review: SDOH reviewed no interventions necessary Readmission risk has been reviewed: Yes Transition of care needs: no transition of care needs at this time

## 2024-05-31 ENCOUNTER — Encounter: Payer: Self-pay | Admitting: Radiology

## 2024-05-31 DIAGNOSIS — H353211 Exudative age-related macular degeneration, right eye, with active choroidal neovascularization: Secondary | ICD-10-CM | POA: Diagnosis not present

## 2024-06-01 DIAGNOSIS — R55 Syncope and collapse: Secondary | ICD-10-CM

## 2024-06-02 ENCOUNTER — Telehealth: Payer: Self-pay

## 2024-06-02 NOTE — Telephone Encounter (Signed)
 Follow-up after same day discharge: Implant date: 05/27/2024 MD: MCA Device: PPM  Location: L chest    Wound check visit: 06/09/2024 90 day MD follow-up: 08/27/2024  Remote Transmission received:yes  Dressing/sling removed: n/a  Confirm OAC restart on: yes   Please continue to monitor your cardiac device site for redness, swelling, and drainage. Call the device clinic at [phone] if you experience these symptoms, fever/chills, or have questions about your device.   Remote monitoring is used to monitor your cardiac device from home. This monitoring is scheduled every 91 days by our office. It allows us  to keep an eye on the functioning of your device to ensure it is working properly.

## 2024-06-09 ENCOUNTER — Ambulatory Visit: Attending: Student in an Organized Health Care Education/Training Program

## 2024-06-09 DIAGNOSIS — I442 Atrioventricular block, complete: Secondary | ICD-10-CM | POA: Diagnosis not present

## 2024-06-09 LAB — CUP PACEART INCLINIC DEVICE CHECK
Date Time Interrogation Session: 20251112160816
Implantable Lead Connection Status: 753985
Implantable Lead Connection Status: 753985
Implantable Lead Implant Date: 20251030
Implantable Lead Implant Date: 20251030
Implantable Lead Location: 753859
Implantable Lead Location: 753860
Implantable Lead Model: 3830
Implantable Lead Model: 5076
Implantable Pulse Generator Implant Date: 20251030

## 2024-06-09 NOTE — Patient Instructions (Signed)

## 2024-06-09 NOTE — Progress Notes (Signed)
 Normal dual chamber pacemaker wound check. Presenting rhythm: AS/VP 62. Wound well healed. Routine testing performed. Thresholds, sensing, and impedance consistent with implant measurements and at 3.5V safety margin/auto capture until 3 month visit. No episodes. Reviewed arm restrictions to continue for 6 weeks total post op.  Pt enrolled in remote follow-up.

## 2024-06-10 ENCOUNTER — Other Ambulatory Visit: Payer: Self-pay | Admitting: Internal Medicine

## 2024-06-15 ENCOUNTER — Ambulatory Visit: Payer: Self-pay | Admitting: Student in an Organized Health Care Education/Training Program

## 2024-06-21 ENCOUNTER — Emergency Department (HOSPITAL_COMMUNITY)
Admission: EM | Admit: 2024-06-21 | Discharge: 2024-06-21 | Disposition: A | Discharge status: Home / Self Care | Attending: Emergency Medicine | Admitting: Emergency Medicine

## 2024-06-21 ENCOUNTER — Emergency Department (HOSPITAL_COMMUNITY)

## 2024-06-21 DIAGNOSIS — S0083XA Contusion of other part of head, initial encounter: Secondary | ICD-10-CM | POA: Insufficient documentation

## 2024-06-21 DIAGNOSIS — I480 Paroxysmal atrial fibrillation: Secondary | ICD-10-CM | POA: Insufficient documentation

## 2024-06-21 DIAGNOSIS — R220 Localized swelling, mass and lump, head: Secondary | ICD-10-CM | POA: Diagnosis not present

## 2024-06-21 DIAGNOSIS — N179 Acute kidney failure, unspecified: Secondary | ICD-10-CM | POA: Insufficient documentation

## 2024-06-21 DIAGNOSIS — Z7901 Long term (current) use of anticoagulants: Secondary | ICD-10-CM | POA: Insufficient documentation

## 2024-06-21 DIAGNOSIS — S79919A Unspecified injury of unspecified hip, initial encounter: Secondary | ICD-10-CM | POA: Insufficient documentation

## 2024-06-21 DIAGNOSIS — W19XXXA Unspecified fall, initial encounter: Secondary | ICD-10-CM

## 2024-06-21 DIAGNOSIS — W01198A Fall on same level from slipping, tripping and stumbling with subsequent striking against other object, initial encounter: Secondary | ICD-10-CM | POA: Insufficient documentation

## 2024-06-21 DIAGNOSIS — Z043 Encounter for examination and observation following other accident: Secondary | ICD-10-CM | POA: Diagnosis not present

## 2024-06-21 DIAGNOSIS — Z79899 Other long term (current) drug therapy: Secondary | ICD-10-CM | POA: Insufficient documentation

## 2024-06-21 DIAGNOSIS — Y9301 Activity, walking, marching and hiking: Secondary | ICD-10-CM | POA: Insufficient documentation

## 2024-06-21 DIAGNOSIS — Z981 Arthrodesis status: Secondary | ICD-10-CM | POA: Diagnosis not present

## 2024-06-21 DIAGNOSIS — S0990XA Unspecified injury of head, initial encounter: Secondary | ICD-10-CM | POA: Diagnosis not present

## 2024-06-21 DIAGNOSIS — S199XXA Unspecified injury of neck, initial encounter: Secondary | ICD-10-CM | POA: Diagnosis not present

## 2024-06-21 DIAGNOSIS — I10 Essential (primary) hypertension: Secondary | ICD-10-CM | POA: Diagnosis not present

## 2024-06-21 DIAGNOSIS — M47816 Spondylosis without myelopathy or radiculopathy, lumbar region: Secondary | ICD-10-CM | POA: Diagnosis not present

## 2024-06-21 DIAGNOSIS — M160 Bilateral primary osteoarthritis of hip: Secondary | ICD-10-CM | POA: Diagnosis not present

## 2024-06-21 DIAGNOSIS — M47812 Spondylosis without myelopathy or radiculopathy, cervical region: Secondary | ICD-10-CM | POA: Diagnosis not present

## 2024-06-21 LAB — CBC WITH DIFFERENTIAL/PLATELET
Abs Immature Granulocytes: 0.04 K/uL (ref 0.00–0.07)
Basophils Absolute: 0.1 K/uL (ref 0.0–0.1)
Basophils Relative: 1 %
Eosinophils Absolute: 0.2 K/uL (ref 0.0–0.5)
Eosinophils Relative: 2 %
HCT: 39.1 % (ref 36.0–46.0)
Hemoglobin: 12.8 g/dL (ref 12.0–15.0)
Immature Granulocytes: 1 %
Lymphocytes Relative: 34 %
Lymphs Abs: 2.7 K/uL (ref 0.7–4.0)
MCH: 28.1 pg (ref 26.0–34.0)
MCHC: 32.7 g/dL (ref 30.0–36.0)
MCV: 85.9 fL (ref 80.0–100.0)
Monocytes Absolute: 0.8 K/uL (ref 0.1–1.0)
Monocytes Relative: 10 %
Neutro Abs: 4.2 K/uL (ref 1.7–7.7)
Neutrophils Relative %: 52 %
Platelets: 291 K/uL (ref 150–400)
RBC: 4.55 MIL/uL (ref 3.87–5.11)
RDW: 13.6 % (ref 11.5–15.5)
WBC: 8 K/uL (ref 4.0–10.5)
nRBC: 0 % (ref 0.0–0.2)

## 2024-06-21 LAB — COMPREHENSIVE METABOLIC PANEL WITH GFR
ALT: 24 U/L (ref 0–44)
AST: 29 U/L (ref 15–41)
Albumin: 4.1 g/dL (ref 3.5–5.0)
Alkaline Phosphatase: 92 U/L (ref 38–126)
Anion gap: 14 (ref 5–15)
BUN: 19 mg/dL (ref 8–23)
CO2: 23 mmol/L (ref 22–32)
Calcium: 10.2 mg/dL (ref 8.9–10.3)
Chloride: 100 mmol/L (ref 98–111)
Creatinine, Ser: 1.14 mg/dL — ABNORMAL HIGH (ref 0.44–1.00)
GFR, Estimated: 48 mL/min — ABNORMAL LOW (ref >60)
Glucose, Bld: 121 mg/dL — ABNORMAL HIGH (ref 70–99)
Potassium: 4.4 mmol/L (ref 3.5–5.1)
Sodium: 137 mmol/L (ref 135–145)
Total Bilirubin: 0.5 mg/dL (ref 0.0–1.2)
Total Protein: 6.8 g/dL (ref 6.5–8.1)

## 2024-06-21 LAB — I-STAT CHEM 8, ED
BUN: 23 mg/dL (ref 8–23)
Calcium, Ion: 1.28 mmol/L (ref 1.15–1.40)
Chloride: 101 mmol/L (ref 98–111)
Creatinine, Ser: 1.3 mg/dL — ABNORMAL HIGH (ref 0.44–1.00)
Glucose, Bld: 114 mg/dL — ABNORMAL HIGH (ref 70–99)
HCT: 39 % (ref 36.0–46.0)
Hemoglobin: 13.3 g/dL (ref 12.0–15.0)
Potassium: 4.2 mmol/L (ref 3.5–5.1)
Sodium: 137 mmol/L (ref 135–145)
TCO2: 24 mmol/L (ref 22–32)

## 2024-06-21 LAB — CBG MONITORING, ED: Glucose-Capillary: 75 mg/dL (ref 70–99)

## 2024-06-21 MED ORDER — ACETAMINOPHEN 500 MG PO TABS
1000.0000 mg | ORAL_TABLET | Freq: Once | ORAL | Status: AC
  Administered 2024-06-21: 1000 mg via ORAL
  Filled 2024-06-21: qty 2

## 2024-06-21 MED ORDER — LACTATED RINGERS IV BOLUS
1000.0000 mL | Freq: Once | INTRAVENOUS | Status: AC
  Administered 2024-06-21: 1000 mL via INTRAVENOUS

## 2024-06-21 NOTE — ED Notes (Signed)
 Patient arrives via [facility] from home as fall on thinners. Patient stated she tripped over a stool and hit her head on the side of the dresser. Patient denies LOC. Anticoagulated on eliquis , last dose was at home right before the fall. EMS notes patient with skin tear to L shin, and hematoma to posterior scalp. Patient ambulatory on scene.

## 2024-06-21 NOTE — ED Provider Notes (Signed)
 [place] EMERGENCY DEPARTMENT AT [facility] Provider Note   CSN: [id number] Arrival date & time: 06/21/24  2026     Patient presents with: Sabrina Parks   Sabrina Parks is a 83 y.o. female. Hx of HTN, HL palpitations,PAF, one episode of global amnesia (2013), intermitt LBBB and syncope, recent dx of a fib on eliquis , flecanide, and metoprolol  presenting status post fall today, level 2 trauma.  Patient endorses mechanical fall, states that she was walking backwards and tripped over a stool.  Clemens, hit the back of her hip.  Denies any pain.  GCS 14 on arrival, hemodynamically stable.  Patient was actively taking dosage of Eliquis  when she stepped backwards and tripped over the stool.    Fall       Prior to Admission medications   Medication Sig Start Date End Date Taking? Authorizing Provider  acetaminophen  (TYLENOL ) 500 MG tablet Take 500 mg by mouth every 6 (six) hours as needed for moderate pain or headache.    [provider]  amitriptyline  (ELAVIL ) 25 MG tablet Take 25 mg by mouth at bedtime.  07/08/13   [provider]  apixaban  (ELIQUIS ) 2.5 MG TABS tablet Take 1 tablet (2.5 mg total) by mouth 2 (two) times daily. 05/28/24   Lesia Ozell Barter, PA-C  Biotin  1 MG CAPS Take 1 mg by mouth daily. 01/23/22   [provider]  Cholecalciferol (VITAMIN D ) 50 MCG (2000 UT) tablet Take 2,000 Units by mouth daily.    [provider]  CRANBERRY PO Take 2 tablets by mouth daily. Gummie    [provider]  flecainide  (TAMBOCOR ) 50 MG tablet TAKE 1 TABLET BY MOUTH TWICE A DAY 06/11/24   Okey Vina GAILS, MD  fluticasone  (FLONASE ) 50 MCG/ACT nasal spray Place 1 spray into both nostrils daily as needed for allergies. 05/03/21   [provider]  levothyroxine  (SYNTHROID , LEVOTHROID) 50 MCG tablet Take 50 mcg by mouth daily before breakfast.    [provider]  LORazepam  (ATIVAN ) 0.5 MG tablet Take 0.5 mg by mouth daily as  needed for anxiety.  07/29/13   [provider]  metoprolol  tartrate (LOPRESSOR ) 25 MG tablet Take 0.5 tablets (12.5 mg total) by mouth 2 (two) times daily. 03/15/24   Okey Vina GAILS, MD  olmesartan (BENICAR) 40 MG tablet Take 40 mg by mouth at bedtime.  09/02/17   [provider]  omeprazole  (PRILOSEC) 40 MG capsule Take 40 mg by mouth every morning.    [provider]  PROAIR  HFA 108 (90 BASE) MCG/ACT inhaler Inhale 2 puffs into the lungs every 6 (six) hours as needed for wheezing or shortness of breath.  06/16/13   [provider]  rosuvastatin  (CRESTOR ) 20 MG tablet Take 1 tablet (20 mg total) by mouth daily. Patient taking differently: Take 10 mg by mouth daily. 08/06/23   Okey Vina GAILS, MD    Allergies: Atorvastatin, Codeine, Morphine, Morphine and codeine, and Sulfa antibiotics    Review of Systems  Updated Vital Signs BP (!) 181/68   Pulse 66   Temp 98.1 F (36.7 C) (Temporal)   Resp 17   Ht 5' 3 (1.6 m)   Wt 58.1 kg   LMP 07/29/1990   SpO2 100%   BMI 22.67 kg/m   Physical Exam Vitals and nursing note reviewed.  Constitutional:      General: She is not in acute distress.    Appearance: She is well-developed. She is not ill-appearing.  HENT:  Head:     Comments: Small hematoma present over the left posterior occiput, nontender to palpation.    Nose:     Comments: No nasoseptal hematoma    Mouth/Throat:     Mouth: Mucous membranes are moist.     Pharynx: Oropharynx is clear.     Comments: No intraoral lesions or dental injuries Eyes:     Conjunctiva/sclera: Conjunctivae normal.  Neck:     Comments: Patient placed in c-collar Cardiovascular:     Rate and Rhythm: Normal rate and regular rhythm.     Pulses: Normal pulses.     Heart sounds: Normal heart sounds. No murmur heard. Pulmonary:     Effort: Pulmonary effort is normal. No respiratory distress.     Breath sounds: Normal breath sounds.  Abdominal:     General: Abdomen is  flat. There is no distension.     Palpations: Abdomen is soft.     Tenderness: There is no abdominal tenderness. There is no right CVA tenderness, left CVA tenderness, guarding or rebound.  Musculoskeletal:        General: No swelling.  Skin:    General: Skin is warm and dry.     Capillary Refill: Capillary refill takes less than 2 seconds.  Neurological:     General: No focal deficit present.     Mental Status: She is alert and oriented to person, place, and time.     Cranial Nerves: No cranial nerve deficit.     Sensory: No sensory deficit.     Motor: No weakness.  Psychiatric:        Mood and Affect: Mood normal.     (all labs ordered are listed, but only abnormal results are displayed) Labs Reviewed  COMPREHENSIVE METABOLIC PANEL WITH GFR - Abnormal; Notable for the following components:      Result Value   Glucose, Bld 121 (*)    Creatinine, Ser 1.14 (*)    GFR, Estimated 48 (*)    All other components within normal limits  I-STAT CHEM 8, ED - Abnormal; Notable for the following components:   Creatinine, Ser 1.30 (*)    Glucose, Bld 114 (*)    All other components within normal limits  CBC WITH DIFFERENTIAL/PLATELET  CBG MONITORING, ED    EKG: None  Radiology: DG Pelvis Portable Result Date: 06/21/2024 EXAM: 1 or 2 VIEW(S) XRAY OF THE PELVIS 06/21/2024 09:17:00 PM COMPARISON: 05/11/2020, 12/10/2023 CLINICAL HISTORY: fall FINDINGS: BONES AND JOINTS: Degenerative changes of the visualized lower lumbar spine. No acute fracture. No joint dislocation. Degenerative changes of the bilateral hips. SOFT TISSUES: Vascular calcifications. IMPRESSION: 1. No evidence of acute Osseous abnormality. Cross-sectional imaging follow-up if persistent concern for fracture Electronically signed by: Luke Bun MD 06/21/2024 09:24 PM EST RP Workstation: [id number]   DG Chest Portable 1 View Result Date: 06/21/2024 EXAM: 1 VIEW(S) XRAY OF THE CHEST 06/21/2024 09:17:00 PM COMPARISON:  05/28/2024 Chest CT 03/05/2024 CLINICAL HISTORY: fall FINDINGS: LINES, TUBES AND DEVICES: Cardiac monitoring lead over left chest. Left chest wall dual lead cardiac device. LUNGS AND PLEURA: No focal pulmonary opacity. No pleural effusion. No pneumothorax. HEART AND MEDIASTINUM: No acute abnormality of the cardiac and mediastinal silhouettes. BONES AND SOFT TISSUES: Partially visualized cervical spinal fusion hardware in place. Suture anchors over left humeral head. No acute osseous abnormality. IMPRESSION: 1. No acute cardiopulmonary process. Electronically signed by: Luke Bun MD 06/21/2024 09:22 PM EST RP Workstation: [id number]   CT Cervical Spine Wo Contrast Result Date: 06/21/2024 EXAM:  CT CERVICAL SPINE WITHOUT CONTRAST 06/21/2024 08:58:08 PM TECHNIQUE: CT of the cervical spine was performed without the administration of intravenous contrast. Multiplanar reformatted images are provided for review. Automated exposure control, iterative reconstruction, and/or weight based adjustment of the mA/kV was utilized to reduce the radiation dose to as low as reasonably achievable. COMPARISON: Plain films 05/27/2023. CLINICAL HISTORY: Neck trauma (Age >= 65y). FINDINGS: CERVICAL SPINE: BONES AND ALIGNMENT: Prior ACDF from C4 to C7. No acute fracture or traumatic malalignment. DEGENERATIVE CHANGES: Mild bilateral degenerative facet disease. SOFT TISSUES: No prevertebral soft tissue swelling. IMPRESSION: 1. No acute abnormality of the cervical spine related to neck trauma. 2. Prior ACDF from C4 to C7. Electronically signed by: Franky Crease MD 06/21/2024 09:02 PM EST RP Workstation: [id number]   CT Head Wo Contrast Result Date: 06/21/2024 EXAM: CT HEAD WITHOUT CONTRAST 06/21/2024 08:58:08 PM TECHNIQUE: CT of the head was performed without the administration of intravenous contrast. Automated exposure control, iterative reconstruction, and/or weight based adjustment of the mA/kV was utilized to reduce the radiation  dose to as low as reasonably achievable. COMPARISON: 06/12/2012 CLINICAL HISTORY: Head trauma, minor (Age >= 65y) FINDINGS: BRAIN AND VENTRICLES: No acute hemorrhage. No evidence of acute infarct. No hydrocephalus. No extra-axial collection. No mass effect or midline shift. Patchy low density throughout the deep white matter is compatible with chronic small vessel disease. ORBITS: No acute abnormality. SINUSES: No acute abnormality. SOFT TISSUES AND SKULL: Soft tissue swelling in the posterior left parietal scalp. No skull fracture. IMPRESSION: 1. No acute intracranial abnormality. 2. Chronic small vessel disease. Electronically signed by: Franky Crease MD 06/21/2024 09:01 PM EST RP Workstation: [id number]     Procedures   Medications Ordered in the ED  lactated ringers  bolus 1,000 mL (0 mLs Intravenous Stopped 06/21/24 2306)  acetaminophen  (TYLENOL ) tablet 1,000 mg (1,000 mg Oral Given 06/21/24 2223)                                    Medical Decision Making Amount and/or Complexity of Data Reviewed Labs: ordered. Radiology: ordered.  Risk OTC drugs.   Based on patient presentation, history, evaluation, high suspicion for mechanical fall with small occipital hematoma, without evidence of intracranial bleed versus C-spine injury on workup today.  Patient overall neurologically intact, very well-appearing, appreciated to have mechanical fall after tripping over her stool today.  Workup overall is very reassuring.  Low suspicion for hypoglycemia versus acute anemia.  Patient does have mild evidence of AKI, therefore provided IV fluids, and recommended to drink large amounts of fluid at home, was also provided Tylenol  for mild pain in the setting of small hematoma over the posterior scalp.  Imaging reassuring against evidence of pelvic fracture versus rib fracture versus pneumothorax.  Overall with reassuring workup, and close follow-up with PCP in 2 to 3 days, patient is stable for discharge at  this time.  Discussed return precautions to the ED.     Final diagnoses:  Fall, initial encounter    ED Discharge Orders     None          Arlee Katz, MD 06/22/24 [id number]    Tonia Chew, MD 06/25/24 [phone]

## 2024-06-21 NOTE — ED Notes (Signed)
 EDP Dr. Zavitz at bedside. C-collar placed.

## 2024-06-21 NOTE — Discharge Instructions (Addendum)
 Follow up for blood pressure recheck with pcp in the next week. Tylenol  as needed for pain. Return for new concerns.

## 2024-06-21 NOTE — Progress Notes (Signed)
   06/21/24 2025  Spiritual Encounters  Type of Visit Initial  Care provided to: Pt not available  Referral source Trauma page  Reason for visit Trauma  OnCall Visit No   Chaplain responded to a level two trauma.  No family present. If a chaplain is requested someone will respond.  Carley [facility]  [phone]

## 2024-06-21 NOTE — ED Notes (Signed)
 Patient transported to CT

## 2024-06-30 DIAGNOSIS — K5901 Slow transit constipation: Secondary | ICD-10-CM | POA: Diagnosis not present

## 2024-07-01 DIAGNOSIS — M5117 Intervertebral disc disorders with radiculopathy, lumbosacral region: Secondary | ICD-10-CM | POA: Diagnosis not present

## 2024-07-08 ENCOUNTER — Ambulatory Visit: Attending: Student in an Organized Health Care Education/Training Program

## 2024-07-08 DIAGNOSIS — R002 Palpitations: Secondary | ICD-10-CM | POA: Diagnosis not present

## 2024-07-09 LAB — CUP PACEART REMOTE DEVICE CHECK
Battery Remaining Longevity: 135 mo
Battery Voltage: 3.2 V
Brady Statistic AP VP Percent: 31.9 %
Brady Statistic AP VS Percent: 0.07 %
Brady Statistic AS VP Percent: 67.73 %
Brady Statistic AS VS Percent: 0.29 %
Brady Statistic RA Percent Paced: 31.97 %
Brady Statistic RV Percent Paced: 99.63 %
Date Time Interrogation Session: 20251211012928
Implantable Lead Connection Status: 753985
Implantable Lead Connection Status: 753985
Implantable Lead Implant Date: 20251030
Implantable Lead Implant Date: 20251030
Implantable Lead Location: 753859
Implantable Lead Location: 753860
Implantable Lead Model: 3830
Implantable Lead Model: 5076
Implantable Pulse Generator Implant Date: 20251030
Lead Channel Impedance Value: 323 Ohm
Lead Channel Impedance Value: 380 Ohm
Lead Channel Impedance Value: 399 Ohm
Lead Channel Impedance Value: 608 Ohm
Lead Channel Pacing Threshold Amplitude: 0.75 V
Lead Channel Pacing Threshold Amplitude: 0.875 V
Lead Channel Pacing Threshold Pulse Width: 0.4 ms
Lead Channel Pacing Threshold Pulse Width: 0.4 ms
Lead Channel Sensing Intrinsic Amplitude: 1.875 mV
Lead Channel Sensing Intrinsic Amplitude: 1.875 mV
Lead Channel Sensing Intrinsic Amplitude: 22.5 mV
Lead Channel Sensing Intrinsic Amplitude: 22.5 mV
Lead Channel Setting Pacing Amplitude: 3.5 V
Lead Channel Setting Pacing Amplitude: 3.5 V
Lead Channel Setting Pacing Pulse Width: 0.4 ms
Lead Channel Setting Sensing Sensitivity: 0.9 mV
Zone Setting Status: 755011
Zone Setting Status: 755011

## 2024-07-15 NOTE — Progress Notes (Signed)
 Remote PPM Transmission

## 2024-07-23 ENCOUNTER — Ambulatory Visit: Payer: Self-pay | Admitting: Student in an Organized Health Care Education/Training Program

## 2024-08-26 ENCOUNTER — Ambulatory Visit

## 2024-08-27 ENCOUNTER — Ambulatory Visit: Attending: Student | Admitting: Student

## 2024-08-27 ENCOUNTER — Encounter: Payer: Self-pay | Admitting: Student

## 2024-08-27 VITALS — BP 118/70 | HR 61 | Ht 63.0 in | Wt 126.0 lb

## 2024-08-27 DIAGNOSIS — I442 Atrioventricular block, complete: Secondary | ICD-10-CM | POA: Diagnosis not present

## 2024-08-27 DIAGNOSIS — R55 Syncope and collapse: Secondary | ICD-10-CM

## 2024-08-27 DIAGNOSIS — I10 Essential (primary) hypertension: Secondary | ICD-10-CM | POA: Diagnosis not present

## 2024-08-27 DIAGNOSIS — R002 Palpitations: Secondary | ICD-10-CM | POA: Diagnosis not present

## 2024-08-27 LAB — CUP PACEART INCLINIC DEVICE CHECK
Battery Remaining Longevity: 149 mo
Battery Voltage: 3.18 V
Brady Statistic AP VP Percent: 38.97 %
Brady Statistic AP VS Percent: 0.12 %
Brady Statistic AS VP Percent: 60.48 %
Brady Statistic AS VS Percent: 0.43 %
Brady Statistic RA Percent Paced: 39.07 %
Brady Statistic RV Percent Paced: 99.45 %
Date Time Interrogation Session: 20260130133413
Implantable Lead Connection Status: 753985
Implantable Lead Connection Status: 753985
Implantable Lead Implant Date: 20251030
Implantable Lead Implant Date: 20251030
Implantable Lead Location: 753859
Implantable Lead Location: 753860
Implantable Lead Model: 3830
Implantable Lead Model: 5076
Implantable Pulse Generator Implant Date: 20251030
Lead Channel Impedance Value: 342 Ohm
Lead Channel Impedance Value: 399 Ohm
Lead Channel Impedance Value: 437 Ohm
Lead Channel Impedance Value: 589 Ohm
Lead Channel Pacing Threshold Amplitude: 0.75 V
Lead Channel Pacing Threshold Amplitude: 0.875 V
Lead Channel Pacing Threshold Pulse Width: 0.4 ms
Lead Channel Pacing Threshold Pulse Width: 0.4 ms
Lead Channel Sensing Intrinsic Amplitude: 1.375 mV
Lead Channel Sensing Intrinsic Amplitude: 1.75 mV
Lead Channel Sensing Intrinsic Amplitude: 17.5 mV
Lead Channel Sensing Intrinsic Amplitude: 18.125 mV
Lead Channel Setting Pacing Amplitude: 1.75 V
Lead Channel Setting Pacing Amplitude: 2 V
Lead Channel Setting Pacing Pulse Width: 0.4 ms
Lead Channel Setting Sensing Sensitivity: 0.9 mV
Zone Setting Status: 755011
Zone Setting Status: 755011

## 2024-08-27 NOTE — Patient Instructions (Addendum)
 Medication Instructions:  No medication changes today. *If you need a refill on your cardiac medications before your next appointment, please call your pharmacy*  Lab Work: No labwork ordered today. If you have labs (blood work) drawn today and your tests are completely normal, you will receive your results only by: MyChart Message (if you have MyChart) OR A paper copy in the mail If you have any lab test that is abnormal or we need to change your treatment, we will call you to review the results.  Testing/Procedures: No testing ordered today  Follow-Up: At [facility], you and your health needs are our priority.  As part of our continuing mission to provide you with exceptional heart care, our providers are all part of one team.  This team includes your primary Cardiologist (physician) and Advanced Practice Providers or APPs (Physician Assistants and Nurse Practitioners) who all work together to provide you with the care you need, when you need it.  Your next appointment:   6 month(s)  Provider:    [name], MD   We recommend signing up for the patient portal called MyChart.  Sign up information is provided on this After Visit Summary.  MyChart is used to connect with patients for Virtual Visits (Telemedicine).  Patients are able to view lab/test results, encounter notes, upcoming appointments, etc.  Non-urgent messages can be sent to your provider as well.   To learn more about what you can do with MyChart, go to [email or web address].

## 2024-08-27 NOTE — Progress Notes (Signed)
" °  Electrophysiology Office Note:   ID:  Ritaj, Dullea September 11, 1940, MRN [id number]  Primary Cardiologist: Vina Gull, MD Electrophysiologist: [name], MD      History of Present Illness:   Korine Winton is a 84 y.o. female with h/o HTN, HLD, Palpitations, PAF, and  syncope seen today for routine electrophysiology follow-up s/p Pacemaker implant.  Pt with previous loop recorder for syncope and baseline LBBB. Had recurrent syncope on flecainide  and metoprolol  for PAF with pauses noted on monitor. Taken for [facility] for tachy-brady.  Since last being seen in our clinic the patient reports doing very well. Site well healed. No breakthrough arrhythmia of which she is aware.  she denies chest pain, palpitations, dyspnea, PND, orthopnea, nausea, vomiting, dizziness, syncope, edema, weight gain, or early satiety.    Review of systems complete and found to be negative unless listed in HPI.   EP Information / Studies Reviewed:    EKG is ordered today. Personal review as below.  EKG Interpretation Date/Time:  Friday August 27 2024 12:37:26 EST Ventricular Rate:  61 PR Interval:  196 QRS Duration:  134 QT Interval:  434 QTC Calculation: 436 R Axis:   85  Text Interpretation: Atrial-sensed ventricular-paced rhythm Confirmed by Lesia Heck ([id number]) on 08/27/2024 12:38:09 PM    PPM Interrogation-  reviewed in detail today,  See PACEART report.  Arrhythmia/Device History ILR-Medtronic-[employer]- Explanted 10/30 MDT DC/LBBAP PPM MCA   Physical Exam:   VS:  BP 118/70   Pulse 61   Ht 5' 3 (1.6 m)   Wt 126 lb (57.2 kg)   LMP 07/29/1990   BMI 22.32 kg/m    Wt Readings from Last 3 Encounters:  08/27/24 126 lb (57.2 kg)  06/21/24 128 lb (58.1 kg)  05/27/24 130 lb 1.1 oz (59 kg)     GEN: No acute distress  NECK: No JVD; No carotid bruits CARDIAC: Regular rate and rhythm, no murmurs, rubs, gallops RESPIRATORY:  Clear to auscultation without rales, wheezing or  rhonchi  ABDOMEN: Soft, non-tender, non-distended EXTREMITIES:  No edema; No deformity   ASSESSMENT AND PLAN:    Tachy-Brady syndrome s/p Medtronic PPM  Normal PPM function See Pace Art report No changes today  Paroxysmal AF EKG today shows NSR with stable intervals Continue eliquis  2.5 mg BID for CHA2DS2/VASc of at least 6 Continue flecainide  50 mg BID Continue lopressor  12.5 mg BID Follow burden on device.   HTN Stable on current regimen    H/o Syncope Likely stokes adam No recurrence s/p pacing.   Disposition:   Follow up with EP Team in 12 months  Signed, Ozell Prentice Lesia, PA-C  "

## 2024-08-29 ENCOUNTER — Ambulatory Visit: Payer: Self-pay | Admitting: Student in an Organized Health Care Education/Training Program

## 2024-10-07 ENCOUNTER — Ambulatory Visit

## 2024-11-25 ENCOUNTER — Ambulatory Visit

## 2025-01-06 ENCOUNTER — Ambulatory Visit

## 2025-02-24 ENCOUNTER — Ambulatory Visit

## 2025-04-07 ENCOUNTER — Ambulatory Visit

## 2025-05-26 ENCOUNTER — Ambulatory Visit
